# Patient Record
Sex: Male | Born: 1935 | Race: White | Hispanic: No | Marital: Married | Smoking: Former smoker
Health system: Southern US, Community
[De-identification: ages and names within clinical notes are randomized; demographics above are authoritative.]

## PROBLEM LIST (undated history)

## (undated) DIAGNOSIS — K449 Diaphragmatic hernia without obstruction or gangrene: Secondary | ICD-10-CM

## (undated) DIAGNOSIS — Z800 Family history of malignant neoplasm of digestive organs: Secondary | ICD-10-CM

## (undated) DIAGNOSIS — G5139 Clonic hemifacial spasm, unspecified: Secondary | ICD-10-CM

## (undated) DIAGNOSIS — E7800 Pure hypercholesterolemia, unspecified: Secondary | ICD-10-CM

## (undated) DIAGNOSIS — K648 Other hemorrhoids: Secondary | ICD-10-CM

## (undated) DIAGNOSIS — D126 Benign neoplasm of colon, unspecified: Secondary | ICD-10-CM

## (undated) DIAGNOSIS — R451 Restlessness and agitation: Secondary | ICD-10-CM

## (undated) DIAGNOSIS — I10 Essential (primary) hypertension: Secondary | ICD-10-CM

## (undated) HISTORY — DX: Diaphragmatic hernia without obstruction or gangrene: K44.9

## (undated) HISTORY — DX: Clonic hemifacial spasm, unspecified: G51.39

## (undated) HISTORY — DX: Pure hypercholesterolemia, unspecified: E78.00

## (undated) HISTORY — DX: Family history of malignant neoplasm of digestive organs: Z80.0

## (undated) HISTORY — DX: Essential (primary) hypertension: I10

## (undated) HISTORY — PX: COLONOSCOPY: SHX174

## (undated) HISTORY — PX: APPENDECTOMY: SHX54

## (undated) HISTORY — DX: Benign neoplasm of colon, unspecified: D12.6

## (undated) HISTORY — DX: Restlessness and agitation: R45.1

## (undated) HISTORY — DX: Other hemorrhoids: K64.8

## (undated) HISTORY — PX: VASECTOMY: SHX75

## (undated) HISTORY — PX: CATARACT EXTRACTION, BILATERAL: SHX1313

## (undated) HISTORY — PX: CARDIOVASCULAR STRESS TEST: SHX262

---

## 2000-09-26 ENCOUNTER — Ambulatory Visit (HOSPITAL_COMMUNITY): Admission: RE | Admit: 2000-09-26 | Discharge: 2000-09-26 | Payer: Self-pay | Admitting: Gastroenterology

## 2007-02-07 ENCOUNTER — Emergency Department (HOSPITAL_COMMUNITY): Admission: EM | Admit: 2007-02-07 | Discharge: 2007-02-07 | Payer: Self-pay | Admitting: Family Medicine

## 2007-05-01 ENCOUNTER — Ambulatory Visit (HOSPITAL_COMMUNITY): Admission: RE | Admit: 2007-05-01 | Discharge: 2007-05-01 | Payer: Self-pay | Admitting: Endocrinology

## 2007-08-07 ENCOUNTER — Ambulatory Visit: Payer: Self-pay | Admitting: Vascular Surgery

## 2010-10-21 ENCOUNTER — Institutional Professional Consult (permissible substitution) (INDEPENDENT_AMBULATORY_CARE_PROVIDER_SITE_OTHER): Payer: Medicare Other | Admitting: Family Medicine

## 2010-10-21 DIAGNOSIS — I10 Essential (primary) hypertension: Secondary | ICD-10-CM

## 2010-10-21 DIAGNOSIS — E7800 Pure hypercholesterolemia, unspecified: Secondary | ICD-10-CM

## 2010-10-21 DIAGNOSIS — E119 Type 2 diabetes mellitus without complications: Secondary | ICD-10-CM

## 2010-12-22 NOTE — Procedures (Signed)
CAROTID DUPLEX EXAM   INDICATION:  Carotid artery disease.   HISTORY:  Diabetes:  Yes.  Cardiac:  No.  Hypertension:  Yes.  Smoking:  No.  Previous Surgery:  No.  CV History:  Amaurosis Fugax No, Paresthesias No, Hemiparesis No                                       RIGHT             LEFT  Brachial systolic pressure:         148               160  Brachial Doppler waveforms:         WNL               WNL  Vertebral direction of flow:        Antegrade         Antegrade  DUPLEX VELOCITIES (cm/sec)  CCA peak systolic                   127               134  ECA peak systolic                   271               152  ICA peak systolic                   128               120  ICA end diastolic                   34                20  PLAQUE MORPHOLOGY:                  Mixed, calcific   Mixed, calcific  PLAQUE AMOUNT:                      Moderate          Moderate  PLAQUE LOCATION:                    Bifurcation, ICA, ECA               Bifurcation, ICA   IMPRESSION:  1. Bilateral 40-59% internal carotid artery stenosis.  2. Right external carotid artery stenosis.  3. Bilateral vertebral arteries with antegrade flow.   ___________________________________________  Larina Earthly, M.D.   PB/MEDQ  D:  08/07/2007  T:  08/07/2007  Job:  [id number]   cc:   Elmore Guise., M.D.

## 2010-12-25 NOTE — Procedures (Signed)
[facility]  Patient:    Peter [name], Peter [name]                 MRN: [id number] Proc. Date: 09/26/00 Adm. Date:  [id number] Attending:  Orland Mustard CC:         Alfonse Alpers. Dagoberto Ligas, M.D.   Procedure Report  PROCEDURE:  Colonoscopy and coagulation of polyp.  ENDOSCOPIST:  Llana Aliment. Edwards, M.D.  MEDICATIONS:  Fentanyl 75 mcg, Versed 8 mg IV.  SCOPE:  Olympus adult video colonoscope.  INDICATIONS:  Strong family history of colon cancer.  His brother recently had colon cancer.  DESCRIPTION OF PROCEDURE:  The procedure had been explained to the patient and consent obtained.  With the patient in the left lateral decubitus position, the Olympus adult video colonoscope was inserted and advanced under direct visualization.  The prep was excellent.  We were able to advance to the cecum without difficult.  The right lower quadrant was transilluminated and ileocecal valve seen.  The scope was withdrawn.   The cecum, ascending colon, hepatic flexure, transverse colon, splenic flexure, descending and sigmoid colon were seen well.  The colon was free of any polyps, significant diverticula, or any other lesions.  The scope was withdrawn to the rectum, and the rectum was also free of polyps. The patient tolerated the procedure well. The patient as maintained on low-flow oxygen and pulse oximetry throughout the procedure with no evidence of problem.  ASSESSMENT:  No evidence of colon polyps.  PLAN:  Due to his family history, will recommend repeating in five years with yearly hemoccults in between. DD:  09/26/00 TD:  09/27/00 Job: [id number]

## 2010-12-25 NOTE — H&P (Signed)
NAMEMAKAR, SLATTER NO.:  [id number]   MEDICAL RECORD NO.:  [id number]          PATIENT TYPE:  OUT   LOCATION:  XRAY                         FACILITY:  [facility]   PHYSICIAN:  Elmore Guise., M.D.DATE OF BIRTH:  04-27-1930   DATE OF ADMISSION:  07/04/2007  DATE OF DISCHARGE:                              HISTORY & PHYSICAL   INDICATIONS FOR PROCEDURE:  Multiple cardiac risk factors with  increasing exertional chest pain.   PRIMARY CARE PHYSICIAN:  Corrin Parker, M.D.   HISTORY OF PRESENT ILLNESS:  Mr. Chadderdon is a very pleasant 77 year old  white male.  Past medical history of hypertension, dyslipidemia,  diabetes mellitus, who presents for new patient evaluation.  The patient  initially presented for stress testing back in February of this year.  At that time, he underwent a stress Cardiolite exercising via Bruce  protocol for 3 minutes and 30 seconds achieving a peak heart rate of 134  beats per minute.  He had no evidence of inducible ischemia and normal  LV systolic function with an EF of 64%.  He states he continued to do  his normal activities.  However now with walking up a hill, pushing a  trash can or lawn mower, walking more than 6 blocks, he will note  increasing exertional chest pressure and tightness.  He states that his  symptoms have been present for the last 5 years.  However, seemed to be  a little bit more intense currently.  They are associated with shortness  of breath.  He will stop and it will go away.  He can then resume his  activities at a decreased intensity and seems to do pretty well.  He has  no palpitations.  No diaphoresis and no nausea or vomiting associated  with it.  He initially thought this was from esophageal spasm and has  had an upper GI which did show occasional spasm per patient.  He  currently is not having any restless symptoms.  His weight is stable for  him.  He denies any orthopnea or PND.  He does have chronic  lower  extremity edema, right greater than left.  His blood pressure has been  elevated for some time.  He does report when having a vasectomy over 30  years ago, he thinks he had a reaction to the Betadine solution which  caused him to get short of breath.  He denies any other problems.  However since that time, he has been told that he may have an iodine  allergy.  Per his recall, he denies any recent contrast exposure.   REVIEW OF SYSTEMS:  As per HPI, otherwise negative.   CURRENT MEDICATIONS:  1. Caduet 10/10 mg daily.  2. Aspirin 81 mg daily.  3. Actos 45 mg daily.  4. Cialis or Viagra p.r.n.  5. Benicar 40 mg daily.  6. Januvia 100 mg daily.  7. Tekturna 300 mg daily.   ALLERGIES:  1. BETADINE.  2. (Questionable IODINE with shortness of breath and swelling.)   FAMILY HISTORY:  Positive for hypertension with his brothers.  No  history  of early heart disease.   SOCIAL HISTORY:  He is married.  He is retired from Corporate treasurer  at [employer].  He does not exercise, but  remains very active with yard work  and around the house.  Smoked for 15 pack years, quit 30 years ago.  Drinks 1 alcoholic beverage daily and 2-3 cups of coffee daily.   PAST SURGICAL HISTORY:  1. Includes hernia repair.  2. Vasectomy in the past.   PHYSICAL EXAMINATION:  VITAL SIGNS:  Weight is 211 pounds, blood  pressure is 172/80, heart rate 108 and regular.  GENERAL:  He is a very pleasant, elderly white male alert and oriented x  4.  No acute distress.  HEENT:  Normal.  NECK:  Supple.  No lymphadenopathy, 2+ carotids.  No JVD.  He does have  soft bruit,  left greater than right.  LUNGS:  Clear.  HEART:  Regular with normal S1-S2, soft 2/6 systolic ejection murmur  noted.  ABDOMEN:  Soft, nontender, nondistended.  No rebound or guarding.  EXTREMITIES:  Warm with 2+ pedal pulses, 2+ femoral pulses.  He has 2+  edema on the right, 1+ edema on the left.   LABORATORY DATA:  His most recent blood  work was reviewed and showed a  BUN 25, creatinine 1.1, potassium level 4.6, white blood cell count 5.3,  hemoglobin of 12.3 and platelet count 235.  His coags showed PT and an  INR of 12.9 and 1.0 with a PTT of 26.   DIAGNOSTICS:  His last EKG showed normal sinus rhythm with a rate of 80  per minute, normal axis, normal intervals with no significant ST/T-wave  changes.   IMPRESSION:  1. Exertional angina  2. Dyslipidemia.  3. Diabetes mellitus.  4. Hypertension   PLAN:  1. At this time, he will continue his medicines as listed.  2. He is not having any unstable symptoms.  However, because of      symptoms, he will be scheduled for heart catheterization.  3. At this time, I would recommend adding Coreg CR 20 mg once daily to      help with better blood pressure control as well as decreasing      myocardial work load.  4. Since he has history of allergies to Betadine and questionable      allergy to iodine, we will prophylax with Benadryl and prednisone a      day prior and day of procedure.  He will also get an H2 blocker day      of procedure.  5. Risks of the procedure were explained to him in detail and the      patient would like to proceed.  All his questions were answered.     Elmore Guise., M.D.  Electronically Signed    TWK/MEDQ  D:  07/04/2007  T:  07/04/2007  Job:  [id number]   cc:   Alfonse Alpers. Dagoberto Ligas, M.D.

## 2011-01-14 ENCOUNTER — Encounter: Payer: Self-pay | Admitting: *Deleted

## 2011-01-18 ENCOUNTER — Other Ambulatory Visit: Payer: Medicare Other

## 2011-01-18 DIAGNOSIS — E7800 Pure hypercholesterolemia, unspecified: Secondary | ICD-10-CM

## 2011-01-18 DIAGNOSIS — E119 Type 2 diabetes mellitus without complications: Secondary | ICD-10-CM

## 2011-01-18 DIAGNOSIS — I10 Essential (primary) hypertension: Secondary | ICD-10-CM

## 2011-01-18 LAB — COMPREHENSIVE METABOLIC PANEL
Albumin: 4.5 g/dL (ref 3.5–5.2)
Alkaline Phosphatase: 55 U/L (ref 39–117)
BUN: 23 mg/dL (ref 6–23)
CO2: 25 mEq/L (ref 19–32)
Calcium: 8.8 mg/dL (ref 8.4–10.5)
Glucose, Bld: 130 mg/dL — ABNORMAL HIGH (ref 70–99)
Potassium: 4.9 mEq/L (ref 3.5–5.3)
Total Protein: 6.7 g/dL (ref 6.0–8.3)

## 2011-01-18 LAB — LIPID PANEL
Cholesterol: 132 mg/dL (ref 0–200)
HDL: 55 mg/dL (ref >39)
LDL Cholesterol: 65 mg/dL (ref 0–99)
Triglycerides: 59 mg/dL (ref <150)

## 2011-01-18 LAB — TSH: TSH: 3.303 u[IU]/mL (ref 0.350–4.500)

## 2011-01-19 LAB — MICROALBUMIN / CREATININE URINE RATIO
Creatinine, Urine: 134.6 mg/dL
Microalb Creat Ratio: 5.2 mg/g (ref 0.0–30.0)
Microalb, Ur: 0.7 mg/dL (ref 0.00–1.89)

## 2011-01-21 ENCOUNTER — Telehealth: Payer: Self-pay | Admitting: *Deleted

## 2011-01-21 ENCOUNTER — Encounter: Payer: Self-pay | Admitting: Family Medicine

## 2011-01-21 ENCOUNTER — Ambulatory Visit (INDEPENDENT_AMBULATORY_CARE_PROVIDER_SITE_OTHER): Payer: Medicare Other | Admitting: Family Medicine

## 2011-01-21 VITALS — BP 150/74 | HR 68 | Ht 69.5 in | Wt 193.0 lb

## 2011-01-21 DIAGNOSIS — I10 Essential (primary) hypertension: Secondary | ICD-10-CM

## 2011-01-21 DIAGNOSIS — R079 Chest pain, unspecified: Secondary | ICD-10-CM

## 2011-01-21 DIAGNOSIS — E7800 Pure hypercholesterolemia, unspecified: Secondary | ICD-10-CM

## 2011-01-21 DIAGNOSIS — E119 Type 2 diabetes mellitus without complications: Secondary | ICD-10-CM

## 2011-01-21 DIAGNOSIS — E118 Type 2 diabetes mellitus with unspecified complications: Secondary | ICD-10-CM | POA: Insufficient documentation

## 2011-01-21 DIAGNOSIS — N529 Male erectile dysfunction, unspecified: Secondary | ICD-10-CM | POA: Insufficient documentation

## 2011-01-21 MED ORDER — SILDENAFIL CITRATE 50 MG PO TABS: 50.0000 mg | ORAL_TABLET | ORAL | Status: DC | PRN

## 2011-01-21 MED ORDER — CARVEDILOL PHOSPHATE ER 40 MG PO CP24: 40.0000 mg | ORAL_CAPSULE | Freq: Every day | ORAL | Status: DC

## 2011-01-21 MED ORDER — CARVEDILOL 25 MG PO TABS: 25.0000 mg | ORAL_TABLET | Freq: Two times a day (BID) | ORAL | Status: DC

## 2011-01-21 NOTE — Patient Instructions (Signed)
Try and exercise daily (take zantac or pepcid prior to walking). We discussed cutting back on alcohol to help with the blood sugars

## 2011-01-21 NOTE — Progress Notes (Signed)
Subjective:    Patient ID: Peter [name], male    DOB: 12-29-1929, 77 y.o.   MRN: [id number]  HPI Patient presents for follow up on diabetes.  Doesn't check his blood sugars.  Drinks more alcohol in the summertime (2 gin and tonics 3-4 days/week) and has gained some weight.  Diet has otherwise been healthy. Remains active, but no formal exercise.  Doesn't walk much, because it seems to aggravate his indigestion, and is immediately relieved by 2 Tums (had negative stress test in past).    Patient complains of erectile dysfunction. Previously tried Viagra with good results  Hypertension follow-up:  Blood pressures elsewhere are 120-159/60's-70, averaging 135-140, pulse averaging 70.  Denies dizziness, headaches, chest pain.  Denies side effects of medications.  Past Medical History  Diagnosis Date  . Hypertension age 60  . Diabetes mellitus diet controlled  . Hiatal hernia   . Hypercholesteremia   . FHx: colon cancer   . Asthma h/o as a child-s/p immunotherapy    Past Surgical History  Procedure Date  . Cardiovascular stress test normal  . Appendectomy   . Vasectomy   . Colonoscopy 3/08    History   Social History  . Marital Status: Married    Spouse Name: N/A    Number of Children: N/A  . Years of Education: N/A   Occupational History  . Not on file.   Social History Main Topics  . Smoking status: Former Smoker    Quit date: 08/09/1940  . Smokeless tobacco: Not on file  . Alcohol Use: Yes     1-2 drinks daily  . Drug Use: No  . Sexually Active: Not on file   Other Topics Concern  . Not on file   Social History Narrative  . No narrative on file    Family History  Problem Relation Age of Onset  . Alcohol abuse Father   . Cancer Sister     stomach/?pancreatic  . Colon cancer Brother 65  . Cancer Brother     colon cancer in mid 8's  . Arthritis Daughter     psoratic    Current outpatient prescriptions:amLODipine (NORVASC) 10 MG tablet, Take 10 mg  by mouth daily.  , Disp: , Rfl: ;  aspirin 81 MG tablet, Take 162 mg by mouth daily.  , Disp: , Rfl: ;  atorvastatin (LIPITOR) 40 MG tablet, Take 40 mg by mouth daily.  , Disp: , Rfl: ;  carvedilol (COREG CR) 40 MG 24 hr capsule, Take 1 capsule (40 mg total) by mouth daily., Disp: 30 capsule, Rfl: 5;  Coenzyme Q10 10 MG capsule, Take 10 mg by mouth daily.  , Disp: , Rfl:  Multiple Vitamins-Minerals (MULTIVITAMIN WITH MINERALS) tablet, Take 1 tablet by mouth daily.  , Disp: , Rfl: ;  Omega-3 Fatty Acids (FISH OIL) 1000 MG CAPS, Take 1 capsule by mouth daily.  , Disp: , Rfl: ;  DISCONTD: amLODipine-atorvastatin (CADUET) 10-40 MG per tablet, Take 1 tablet by mouth daily.  , Disp: , Rfl: ;  DISCONTD: carvedilol (COREG CR) 20 MG 24 hr capsule, Take 20 mg by mouth daily. , Disp: , Rfl:  cetirizine (ZYRTEC) 10 MG tablet, Take 10 mg by mouth daily.  , Disp: , Rfl: ;  sildenafil (VIAGRA) 50 MG tablet, Take 1 tablet (50 mg total) by mouth as needed for erectile dysfunction., Disp: 4 tablet, Rfl: 0  Allergies  Allergen Reactions  . Iodine Other (See Comments)    Feels like he  is "burning up"  . Penicillins     Review of Systems Indigestion/chest pressure related to exercise only.  Denies palpitations, URI symptoms, cough, SOB, edema, nausea/vomiting.  + ED. No polydipsia or polyuria. No skin concerns.  No other complaints/concerns    Objective:   Physical Exam  Well developed, well nourished patient, in no distress BP 150/74  Pulse 68  Ht 5' 9.5" (1.765 m)  Wt 193 lb (87.544 kg)  BMI 28.09 kg/m2 Neck: No lymphadenopathy or thyromegaly, no carotid bruit Heart:  Regular rate and rhythm, no murmurs, rubs, gallops or ectopy Lungs:  Clear bilaterally, without wheezes, rales or ronchi Abdomen:  Soft, nontender, nondistended, no hepatosplenomegaly or masses, normal bowel sounds Extremities:  No clubbing, cyanosis or edema, 2+ pulses.  Neuro:  Alert and oriented x 3, cranial nerves grossly intact.  DTR's 2+  and symmetric.  Normal strength and sensation Back:  No spine or CVA tenderness Skin: no rashes or suspicious lesions Psych:  Normal mood, affect, hygiene and grooming, normal speech, eye contact         Assessment & Plan:   1. Type II or unspecified type diabetes mellitus without mention of complication, not stated as uncontrolled     Diet controlled.  A1c higher than previously, will work on diet  2. Pure hypercholesterolemia     Well controlled  3. Essential hypertension, benign  carvedilol (COREG CR) 40 MG 24 hr capsule   Suboptimally controlled.  Goal <130/80  4. Chest pain on exertion  Ambulatory referral to Cardiology   negative stress test in past, but ongoing exertional symptoms.  Refer for re-evaluation  5. Erectile dysfunction     Increase coreg to 40mg  qd.  Check BP's at home and fax/mail in 1 month  F/u 3 months--A1c at visit Refer to cardiology--consider repeat stress test

## 2011-01-21 NOTE — Telephone Encounter (Signed)
Spoke with [facility], pt has been taking Carvedilol(Coreg) plain 25mg  BID, his insurance will not pay for the Coreg CR 40 mg once daily. Called patient and explained to him and stressed to him the importance of taking this medication as prescribed TWICE daily. Also called the rx into [facility].

## 2011-01-27 ENCOUNTER — Encounter: Payer: Self-pay | Admitting: Cardiovascular Disease

## 2011-02-11 ENCOUNTER — Encounter: Payer: Self-pay | Admitting: Cardiovascular Disease

## 2011-02-11 ENCOUNTER — Ambulatory Visit (INDEPENDENT_AMBULATORY_CARE_PROVIDER_SITE_OTHER): Payer: Medicare Other | Admitting: Cardiovascular Disease

## 2011-02-11 VITALS — BP 130/64 | HR 70 | Wt 193.0 lb

## 2011-02-11 DIAGNOSIS — E7800 Pure hypercholesterolemia, unspecified: Secondary | ICD-10-CM

## 2011-02-11 DIAGNOSIS — I10 Essential (primary) hypertension: Secondary | ICD-10-CM

## 2011-02-11 NOTE — Assessment & Plan Note (Signed)
His cholesterol levels are well controlled. We will continue with his current dose of Lipitor.

## 2011-02-11 NOTE — Assessment & Plan Note (Signed)
His blood pressure is well controlled. We'll continue with his same medications.  I've asked him to watch her salt intake. He admits that he still uses quite a bit of salt. I have also asked him to exercise on regular basis

## 2011-02-11 NOTE — Progress Notes (Signed)
Gardiner Barefoot Date of Birth  [birth year] [facility] [address]     [address] [place], [place]  [address] [phone]  Fax  [phone]  History of Present Illness:  77 yo patient of Dr. Reyes Ivan.  Hx of diabetes, HTN, dyslipidemia, and elevated coronary calcium score.  Also has mild-moderate carotid disease.  Is active but he doesn't walk regularly.  Has a cabin in [place].  No chest pain or dyspnea with his usual activities.    Current Outpatient Prescriptions on File Prior to Visit  Medication Sig Dispense Refill  . amLODipine (NORVASC) 10 MG tablet Take 10 mg by mouth daily.        Marland Kitchen aspirin 81 MG tablet Take 162 mg by mouth daily.        Marland Kitchen atorvastatin (LIPITOR) 40 MG tablet Take 40 mg by mouth daily.        . cetirizine (ZYRTEC) 10 MG tablet Take 10 mg by mouth daily. Takes as needed      . Coenzyme Q10 10 MG capsule Take 10 mg by mouth daily.        . Multiple Vitamins-Minerals (MULTIVITAMIN WITH MINERALS) tablet Take 1 tablet by mouth daily.        . Omega-3 Fatty Acids (FISH OIL) 1000 MG CAPS Take 1 capsule by mouth daily.        . sildenafil (VIAGRA) 50 MG tablet Take 1 tablet (50 mg total) by mouth as needed for erectile dysfunction.  4 tablet  0  . DISCONTD: carvedilol (COREG) 25 MG tablet Take 1 tablet (25 mg total) by mouth 2 (two) times daily with a meal.  60 tablet  5    Allergies  Allergen Reactions  . Iodine Other (See Comments)    Feels like he is "burning up"  . Penicillins     Past Medical History  Diagnosis Date  . Hypertension age 35  . Diabetes mellitus diet controlled  . Hiatal hernia   . Hypercholesteremia   . FHx: colon cancer   . Asthma h/o as a child-s/p immunotherapy    Past Surgical History  Procedure Date  . Cardiovascular stress test normal  . Appendectomy   . Vasectomy   . Colonoscopy 3/08    History  Smoking status  . Former Smoker  . Quit date: 08/09/1940  Smokeless  tobacco  . Not on file    History  Alcohol Use  . Yes    1-2 drinks daily    Family History  Problem Relation Age of Onset  . Alcohol abuse Father   . Cancer Sister     stomach/?pancreatic  . Colon cancer Brother 72  . Cancer Brother     colon cancer in mid 74's  . Arthritis Daughter     psoratic    Reviw of Systems:  Reviewed in the HPI.  All other systems are negative.  Physical Exam: BP 130/64  Pulse 70  Wt 193 lb (87.544 kg) The patient is alert and oriented x 3.  The mood and affect are normal.   Skin: warm and dry.  Color is normal.    HEENT:   the sclera are nonicteric.  The mucous membranes are moist.  The carotids are 2+ without bruits.  There is no thyromegaly.  There is no JVD.    Lungs: clear.  The chest wall is non tender.    Heart: regular rate with a normal S1 and S2.  There are no murmurs, gallops, or rubs. The PMI is not displaced.     Abdomin: good bowel sounds.  There is no guarding or rebound.  There is no hepatosplenomegaly or tenderness.  There are no masses.   Extremities:  no clubbing, cyanosis, or edema.  The legs are without rashes.  The distal pulses are intact.   Neuro:  Cranial nerves II - XII are intact.  Motor and sensory functions are intact.    The gait is normal.  Assessment / Plan:

## 2011-03-26 ENCOUNTER — Other Ambulatory Visit: Payer: Self-pay | Admitting: Medical

## 2011-03-26 MED ORDER — AMLODIPINE BESYLATE 10 MG PO TABS: 10.0000 mg | ORAL_TABLET | Freq: Every day | ORAL | Status: DC

## 2011-03-26 MED ORDER — ATORVASTATIN CALCIUM 40 MG PO TABS: 40.0000 mg | ORAL_TABLET | Freq: Every day | ORAL | Status: DC

## 2011-04-26 ENCOUNTER — Ambulatory Visit: Payer: Medicare Other | Admitting: Family Medicine

## 2011-05-06 ENCOUNTER — Ambulatory Visit (INDEPENDENT_AMBULATORY_CARE_PROVIDER_SITE_OTHER): Payer: Medicare Other | Admitting: Family Medicine

## 2011-05-06 ENCOUNTER — Encounter: Payer: Self-pay | Admitting: Family Medicine

## 2011-05-06 VITALS — BP 132/64 | HR 68 | Ht 69.5 in | Wt 197.0 lb

## 2011-05-06 DIAGNOSIS — Z23 Encounter for immunization: Secondary | ICD-10-CM

## 2011-05-06 DIAGNOSIS — E119 Type 2 diabetes mellitus without complications: Secondary | ICD-10-CM

## 2011-05-06 LAB — POCT GLYCOSYLATED HEMOGLOBIN (HGB A1C): Hemoglobin A1C: 6.1

## 2011-05-06 NOTE — Patient Instructions (Addendum)
Try and get at least 30 minutes of aerobic activity most days of the week.  It can be 15 minutes at a time, doesn't need to be all 30 minutes at once. Weight loss is encouraged. Try and remember to take evening medications  2 Gram Low Sodium Diet A 2 gram sodium diet restricts the amount of sodium in the diet to no more than 2 grams (g) or 2000 milligrams (mg) daily. Limiting the amount of sodium is often used to help lower blood pressure. It is important if you have heart, liver, or kidney problems. Many foods contain sodium for flavor and sometimes as a preservative. When the amount of sodium in a diet needs to be low, it is important to know what to look for when choosing foods and drinks. The following includes some information and guidelines to help make it easier for you to adapt to a low sodium diet. QUICK TIPS  Do not add salt to food.   Avoid convenience items and fast food.   Choose unsalted snack foods.   Buy lower sodium products, often labeled as "lower sodium" or "no salt added."   Check food labels to learn how much sodium is in 1 serving.   When eating at a restaurant, ask that your food be prepared with less salt or none, if possible.  READING FOOD LABELS FOR SODIUM INFORMATION The nutrition facts label is a good place to find how much sodium is in foods. Look for products with no more than 500 to 600 mg of sodium per meal and no more than 150 mg per serving. Remember that 2 g = 2000 mg. The food label may also list foods as:  Sodium-free: Less than 5 mg in a serving.   Very low sodium: 35 mg or less in a serving.   Low-sodium: 140 mg or less in a serving.   Light in sodium: 50% less sodium in a serving. For example, if a food that usually has 300 mg of sodium is changed to become light in sodium, it will have 150 mg of sodium.   Reduced sodium: 25% less sodium in a serving. For example, if a food that usually has 400 mg of sodium is changed to reduced sodium, it will  have 300 mg of sodium.  CHOOSING FOODS AVOID CHOOSE  Grains: Salted crackers and snack items. Some cereals, including instant hot cereals. Bread stuffing and biscuit mixes. Seasoned rice or pasta mixes. Grains: Unsalted snack items. Low-sodium cereals, oats, puffed wheat and rice, shredded wheat. English muffins and bread. Pasta.  Meats: Salted, canned, smoked, spiced, or pickled meats, including fish and poultry. Bacon, ham, sausage, cold cuts, hot dogs, anchovies. Meats: Low-sodium canned tuna and salmon. Fresh or frozen meat, poultry, and fish.  Dairy: Processed cheese and spreads. Cottage cheese. Buttermilk and condensed milk. Regular cheese. Dairy: Milk. Low-sodium cottage cheese. Yogurt. Sour cream. Low-sodium cheese.  Fruits and Vegetables: Regular canned vegetables. Regular canned tomato sauce and paste. Frozen vegetables in sauces. Olives. Rosita Fire. Relishes. Sauerkraut. Fruits and Vegetables: Low-sodium canned vegetables. Low-sodium tomato sauce and paste. Fresh and frozen vegetables. Fresh and frozen fruit.  Condiments: Canned and packaged gravies. Worcestershire sauce. Tartar sauce. Barbecue sauce. Soy sauce. Steak sauce. Ketchup. Onion, garlic, and table salt. Meat flavorings and tenderizers. Condiments: Fresh and dried herbs and spices. Low-sodium varieties of mustard and ketchup. Lemon juice. Tabasco sauce. Horseradish.  SAMPLE 2 GRAM SODIUM MEAL PLAN Meal Foods Sodium (mg)  Breakfast 1 cup low-fat milk 2 slices  whole-wheat toast 1 tablespoon heart-healthy margarine 1 hard-boiled egg 1 small orange [phone]  Lunch 1 cup raw carrots  cup hummus 1 cup low-fat milk  cup red grapes 1 whole-wheat pita bread [phone]  Dinner 1 cup whole-wheat pasta 1 cup low-sodium tomato sauce 3 ounces lean ground beef 1 small side salad (1 cup raw spinach leaves,  cup cucumber,  cup yellow bell pepper) with 1 teaspoon olive  oil and 1 teaspoon red wine vinegar 2 73 [phone] 25  Snack 1 container low-fat vanilla yogurt 3 graham cracker squares 107 127  Nutrient Analysis Calories: 2033 Protein: 77 g Carbohydrate: 282 g Fat: 72 g Sodium: 1971 mg Document Released: 07/26/2005 Document Re-Released: 01/13/2010 [facility] Patient Information 2011 [place], [place].

## 2011-05-06 NOTE — Progress Notes (Signed)
Patient presents for follow up on diabetes. Doesn't check his blood sugars. Drinks more alcohol in the summertime (2 gin and tonics 3-4 days/week) and has gained some weight.  Has cut back on the alcohol some, but still admits to having 2 drinks 3x/week. He doesn't really watch his diet--eats some candy, carbs. He previously took Actos for 9 months, while under the care of Dr. Dagoberto Ligas.  Didn't seem to make a difference, and has done well off of medications. Remains active (building a shed), but no formal exercise. Doesn't walk much, because it seems to aggravate his indigestion, tight feeling in his chest.  He has seen Dr. Elease Hashimoto since the last visit.  Tries to occasionally walk, and the chest discomfort (ongoing x 10 years, diagnosed as hiatal hernia in the past) doesn't seem as bad as in the past.  Hypertension follow-up: Blood pressures elsewhere are 140-155/low 70's, with a lot of fluctuations in the numbers.  Coreg was changed from CR 25mg , to a generic, now no longer CR, and is taking 25 mg BID.  Admits to missing the evening dose frequently. Denies dizziness, headaches, chest pain. Denies side effects of medications.  Past Medical History  Diagnosis Date  . Hypertension age 77  . Diabetes mellitus diet controlled  . Hiatal hernia   . Hypercholesteremia   . FHx: colon cancer   . Asthma h/o as a child-s/p immunotherapy    Past Surgical History  Procedure Date  . Cardiovascular stress test normal  . Appendectomy   . Vasectomy   . Colonoscopy 3/08    History   Social History  . Marital Status: Married    Spouse Name: N/A    Number of Children: N/A  . Years of Education: N/A   Occupational History  . Not on file.   Social History Main Topics  . Smoking status: Former Smoker    Quit date: 08/09/1940  . Smokeless tobacco: Not on file  . Alcohol Use: Yes     1-2 drinks daily  . Drug Use: No  . Sexually Active: Not on file   Other Topics Concern  . Not on file   Social  History Narrative  . No narrative on file    Family History  Problem Relation Age of Onset  . Alcohol abuse Father   . Cancer Sister     stomach/?pancreatic  . Colon cancer Brother 38  . Cancer Brother     colon cancer in mid 39's  . Arthritis Daughter     psoratic    Current outpatient prescriptions:amLODipine (NORVASC) 10 MG tablet, Take 1 tablet (10 mg total) by mouth daily., Disp: 30 tablet, Rfl: 5;  aspirin 81 MG tablet, Take 162 mg by mouth daily.  , Disp: , Rfl: ;  atorvastatin (LIPITOR) 40 MG tablet, Take 1 tablet (40 mg total) by mouth daily., Disp: 30 tablet, Rfl: 5;  carvedilol (COREG) 25 MG tablet, Take 25 mg by mouth 2 (two) times daily with a meal. Taking 40mg  total , Disp: , Rfl:  Coenzyme Q10 10 MG capsule, Take 10 mg by mouth daily.  , Disp: , Rfl: ;  Omega-3 Fatty Acids (FISH OIL) 1000 MG CAPS, Take 1 capsule by mouth daily.  , Disp: , Rfl: ;  sildenafil (VIAGRA) 50 MG tablet, Take 1 tablet (50 mg total) by mouth as needed for erectile dysfunction., Disp: 4 tablet, Rfl: 0;  cetirizine (ZYRTEC) 10 MG tablet, Take 10 mg by mouth daily. Takes as needed, Disp: , Rfl:  Multiple Vitamins-Minerals (MULTIVITAMIN WITH MINERALS) tablet, Take 1 tablet by mouth daily.  , Disp: , Rfl:   Allergies  Allergen Reactions  . Iodine Other (See Comments)    Feels like he is "burning up"  . Penicillins    ROS:  Denies fevers, URI, SOB, see HPI.  Denies skin lesions, numbness or other concerns  PHYSICAL EXAM: BP 132/64  Pulse 68  Ht 5' 9.5" (1.765 m)  Wt 197 lb (89.359 kg)  BMI 28.67 kg/m2 Neck: No lymphadenopathy or thyromegaly, no carotid bruit  Heart: Regular rate and rhythm, no murmurs, rubs, gallops or ectopy  Lungs: Clear bilaterally, without wheezes, rales or ronchi  Abdomen: Soft, nontender, nondistended, no hepatosplenomegaly or masses, normal bowel sounds  Extremities: No clubbing, cyanosis or edema, 2+ pulses.  Neuro: Alert and oriented x 3, cranial nerves grossly intact.  DTR's 2+ and symmetric. Normal strength and sensation  Back: No spine or CVA tenderness  Skin: no rashes or suspicious lesions  Psych: Normal mood, affect, hygiene and grooming, normal speech, eye contact   ASSESSMENT/PLAN: 1. Type II or unspecified type diabetes mellitus without mention of complication, not stated as uncontrolled  POCT HgB A1C  2. Need for prophylactic vaccination and inoculation against influenza  Flu vaccine greater than or equal to 3yo preservative free IM  3. Need for Tdap vaccination     Shingles vaccine recommended; risks/benefits reviewed.  Written Rx given TdaP today (no record in old chart) Flu shot today  Encouraged healthy diet, regular exercise, weight loss

## 2011-06-25 ENCOUNTER — Ambulatory Visit (INDEPENDENT_AMBULATORY_CARE_PROVIDER_SITE_OTHER): Payer: Medicare Other | Admitting: Medical

## 2011-06-25 ENCOUNTER — Encounter: Payer: Self-pay | Admitting: Medical

## 2011-06-25 VITALS — BP 110/80 | HR 60 | Temp 98.2°F | Resp 16 | Wt 193.0 lb

## 2011-06-25 DIAGNOSIS — R05 Cough: Secondary | ICD-10-CM

## 2011-06-25 DIAGNOSIS — R059 Cough, unspecified: Secondary | ICD-10-CM

## 2011-06-25 DIAGNOSIS — J329 Chronic sinusitis, unspecified: Secondary | ICD-10-CM

## 2011-06-25 MED ORDER — AZITHROMYCIN 250 MG PO TABS: ORAL_TABLET | ORAL | Status: AC

## 2011-06-25 NOTE — Patient Instructions (Signed)

## 2011-06-25 NOTE — Progress Notes (Signed)
Subjective:   HPI  Peter [name] is a 77 y.o. male who presents for five-day history of sinus pressure, cough, headache, postnasal drainage, sore throat, and says he gets a sinus infection this time every year.  He always notes he gets it about the time he cuts his furnace on in the fall/winter. In the past he has always done well with Z-Pak. Denies sick contacts. No fever.  No other aggravating or relieving factors.    No other c/o.  The following portions of the patient's history were reviewed and updated as appropriate: allergies, current medications, past family history, past medical history, past social history, past surgical history and problem list.  Past Medical History  Diagnosis Date  . Hypertension age 60  . Diabetes mellitus diet controlled  . Hiatal hernia   . Hypercholesteremia   . FHx: colon cancer   . Asthma h/o as a child-s/p immunotherapy    Review of Systems Constitutional: -fever, -chills, -sweats, -unexpected -weight change,-fatigue ENT: -runny nose, -ear pain, -sore throat Cardiology:  -chest pain, -palpitations, -edema Respiratory: +cough, -shortness of breath, -wheezing Gastroenterology: -abdominal pain, -nausea, -vomiting, -diarrhea, -constipation Hematology: -bleeding or bruising problems Musculoskeletal: -arthralgias, -myalgias, -joint swelling, -back pain Ophthalmology: -vision changes Urology: -dysuria, -difficulty urinating, -hematuria, -urinary frequency, -urgency Neurology: -headache, -weakness, -tingling, -numbness    Objective:   Filed Vitals:   06/25/11 1150  BP: 110/80  Pulse: 60  Temp: 98.2 F (36.8 C)  Resp: 16    General appearance: Alert, WD/WN, no distress                             Skin: warm, no rash                           Head: +frontal sinus tenderness,                            Eyes: conjunctiva normal, corneas clear, PERRLA                            Ears: right TM with serous fluid, left TM normal, external ear  canals normal                          Nose: septum midline, turbinates swollen, with erythema and clear discharge             Mouth/throat: MMM, tongue normal, mild pharyngeal erythema                           Neck: supple, no adenopathy, no thyromegaly, nontender                          Heart: RRR, normal S1, S2, no murmurs                         Lungs: CTA bilaterally, no wheezes, rales, or rhonchi      Assessment and Plan:   Encounter Diagnoses  Name Primary?  . Sinusitis Yes  . Cough     Prescription given for Zpak.  Can use OTC Mucinex for congestion.  Tylenol or Ibuprofen OTC for fever and malaise.  Discussed symptomatic relief, nasal saline, and  call or return if worse or not improving in 2-3 days.

## 2011-07-20 ENCOUNTER — Other Ambulatory Visit: Payer: Self-pay | Admitting: Family Medicine

## 2011-07-20 NOTE — Telephone Encounter (Signed)
PATIENT NEEDS AN OFFICE VISIT. CLS

## 2011-10-07 ENCOUNTER — Telehealth: Payer: Self-pay | Admitting: *Deleted

## 2011-10-07 ENCOUNTER — Other Ambulatory Visit: Payer: Self-pay | Admitting: Family Medicine

## 2011-10-07 NOTE — Telephone Encounter (Signed)
Left message for patient that I refilled his Coreg x 2 months but he needs to call our office and schedule 6 month fasting follow up on HTN and DM around 11/03/11 with Dr.[name].

## 2011-11-02 ENCOUNTER — Telehealth: Payer: Self-pay | Admitting: Internal Medicine

## 2011-11-02 NOTE — Telephone Encounter (Signed)
atorvastain 40mg  #30

## 2011-11-03 ENCOUNTER — Other Ambulatory Visit: Payer: Self-pay | Admitting: *Deleted

## 2011-11-03 DIAGNOSIS — I10 Essential (primary) hypertension: Secondary | ICD-10-CM

## 2011-11-03 DIAGNOSIS — E7800 Pure hypercholesterolemia, unspecified: Secondary | ICD-10-CM

## 2011-11-03 MED ORDER — AMLODIPINE BESYLATE 10 MG PO TABS: 10.0000 mg | ORAL_TABLET | Freq: Every day | ORAL | Status: DC

## 2011-11-03 MED ORDER — ATORVASTATIN CALCIUM 40 MG PO TABS: 40.0000 mg | ORAL_TABLET | Freq: Every day | ORAL | Status: DC

## 2011-11-03 NOTE — Telephone Encounter (Signed)
Done

## 2011-11-15 ENCOUNTER — Ambulatory Visit (INDEPENDENT_AMBULATORY_CARE_PROVIDER_SITE_OTHER): Payer: Medicare Other | Admitting: Family Medicine

## 2011-11-15 ENCOUNTER — Encounter: Payer: Self-pay | Admitting: Family Medicine

## 2011-11-15 VITALS — BP 150/60 | HR 64 | Ht 68.0 in | Wt 193.0 lb

## 2011-11-15 DIAGNOSIS — E119 Type 2 diabetes mellitus without complications: Secondary | ICD-10-CM

## 2011-11-15 DIAGNOSIS — Z79899 Other long term (current) drug therapy: Secondary | ICD-10-CM

## 2011-11-15 DIAGNOSIS — Z0000 Encounter for general adult medical examination without abnormal findings: Secondary | ICD-10-CM

## 2011-11-15 DIAGNOSIS — Z125 Encounter for screening for malignant neoplasm of prostate: Secondary | ICD-10-CM

## 2011-11-15 DIAGNOSIS — N529 Male erectile dysfunction, unspecified: Secondary | ICD-10-CM

## 2011-11-15 DIAGNOSIS — E7800 Pure hypercholesterolemia, unspecified: Secondary | ICD-10-CM

## 2011-11-15 DIAGNOSIS — I10 Essential (primary) hypertension: Secondary | ICD-10-CM

## 2011-11-15 LAB — POCT URINALYSIS DIPSTICK
Blood, UA: NEGATIVE
Color, UA: 1.02
Leukocytes, UA: NEGATIVE
Nitrite, UA: NEGATIVE
Protein, UA: NEGATIVE
pH, UA: 5

## 2011-11-15 NOTE — Progress Notes (Signed)
Peter [name] is a 78 y.o. male who presents for a complete physical.  He has the following concerns:  F/u HTN--admits to missing the second dose of Coreg about 50% of the time.  Previously his BP was well controlled on the Coreg CR, but insurance made him change to the generic, and forgets to take the second dose. Denies headaches, chest pain, shortness of breath.  ED--previously tried Viagra samples (50mg ), which was effective.    Health maintenance: Immunization History  Administered Date(s) Administered  . Influenza Split 05/06/2011  . Pneumococcal Polysaccharide 08/09/2006  . Tdap 05/06/2011  Has Rx for Zostavax, but hasn't gone to pharmacy to get--has questions Last colonoscopy: 3/08, due again now Last PSA: 2 years ago Dentist: twice yearly Ophtho: February; has cataracts Exercise: "not enough".  Active with work around the house, but no regular aerobic exercise.  Past Medical History  Diagnosis Date  . Hypertension age 31  . Diabetes mellitus diet controlled  . Hiatal hernia   . Hypercholesteremia   . FHx: colon cancer   . Asthma h/o as a child-s/p immunotherapy    Past Surgical History  Procedure Date  . Cardiovascular stress test normal  . Appendectomy   . Vasectomy   . Colonoscopy 3/08    History   Social History  . Marital Status: Married    Spouse Name: N/A    Number of Children: 8  . Years of Education: N/A   Occupational History  . 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 Smoking status: Former Smoker    Quit date: 08/09/1974  . Smokeless tobacco: Never Used  . Alcohol Use: Yes     1-2 drinks daily  . Drug Use: No  . Sexually Active: Yes -- Male partner(s)   Other Topics Concern  . Not on file   Social History Narrative   Lives with wife, dog.  Children live in [place], [place], [place], [place], and 2 daughters here in [place], [address].    Family History  Problem Relation Age of Onset  . Alcohol abuse Father   .  Cancer Sister     stomach/?pancreatic  . Colon cancer Brother 54  . Cancer Brother     colon cancer in mid 32's  . Arthritis Daughter     psoratic  . Hyperlipidemia Son   . Diabetes Neg Hx   . Stroke Neg Hx   . Heart disease Neg Hx    Current Outpatient Prescriptions on File Prior to Visit  Medication Sig Dispense Refill  . amLODipine (NORVASC) 10 MG tablet Take 1 tablet (10 mg total) by mouth daily.  30 tablet  0  . aspirin 81 MG tablet Take 81 mg by mouth daily.       Marland Kitchen atorvastatin (LIPITOR) 40 MG tablet Take 1 tablet (40 mg total) by mouth daily.  30 tablet  0  . Coenzyme Q10 10 MG capsule Take 10 mg by mouth daily.        Marland Kitchen COREG 25 MG tablet TAKE 1 TABLET TWICE DAILY.  60 each  1  . Multiple Vitamins-Minerals (MULTIVITAMIN WITH MINERALS) tablet Take 1 tablet by mouth daily.        . Omega-3 Fatty Acids (FISH OIL) 1000 MG CAPS Take 1 capsule by mouth daily.        . cetirizine (ZYRTEC) 10 MG tablet Take 10 mg by mouth daily. Takes as needed      . sildenafil (VIAGRA) 50  MG tablet Take 1 tablet (50 mg total) by mouth as needed for erectile dysfunction.  4 tablet  0    Allergies  Allergen Reactions  . Iodine Other (See Comments)    Feels like he is "burning up"   ROS:  The patient denies anorexia, fever, weight changes, headaches,  vision loss, ear pain, hoarseness, chest pain, palpitations, dizziness, syncope, dyspnea on exertion, cough, swelling, nausea, vomiting, diarrhea, constipation, abdominal pain, melena, hematochezia, indigestion/heartburn, hematuria, incontinence, weakened urine stream, dysuria, genital lesions, joint pains, numbness, tingling, weakness, tremor, suspicious skin lesions, depression, anxiety, abnormal bleeding/bruising, or enlarged lymph nodes +stable hearing loss.  Allergies are controlled.  Up 2-3 times/night to void.  Has discussed with Dr. Dagoberto Ligas in past, and declined medications.  Very rare/short-lived dizziness first thing in the morning.  Sees  dermatologist regularly (Dr. Londell Moh)  PHYSICAL EXAM: BP 142/62  Pulse 64  Ht 5\' 8"  (1.727 m)  Wt 193 lb (87.544 kg)  BMI 29.35 kg/m2 150/60 by MD General Appearance:    Alert, cooperative, no distress, appears stated age  Head:    Normocephalic, without obvious abnormality, atraumatic  Eyes:    PERRL, conjunctiva/corneas clear, EOM's intact, fundi    benign  Ears:    Normal TM's and external ear canals  Nose:   Nares normal, mucosa normal, no drainage or sinus   tenderness  Throat:   Lips, mucosa, and tongue normal; teeth and gums normal  Neck:   Supple, no lymphadenopathy;  thyroid:  no   enlargement/tenderness/nodules; no carotid   bruit or JVD  Back:    Spine nontender, no curvature, ROM normal, no CVA     tenderness  Lungs:     Clear to auscultation bilaterally without wheezes, rales or     ronchi; respirations unlabored  Chest Wall:    No tenderness or deformity   Heart:    Regular rate and rhythm, S1 and S2 normal, no murmur, rub   or gallop  Breast Exam:    No chest wall tenderness, masses or gynecomastia  Abdomen:     Soft, non-tender, nondistended, normoactive bowel sounds,    no masses, no hepatosplenomegaly  Genitalia:    Normal male external genitalia without lesions.  Testicles without masses.  No inguinal hernias.  Rectal:    Normal sphincter tone, no masses or tenderness; guaiac negative stool.  Prostate smooth, no nodules, not enlarged.  Extremities:   No clubbing, cyanosis or edema  Pulses:   2+ and symmetric all extremities  Skin:   Skin color, texture, turgor normal, no rashes or lesions  Lymph nodes:   Cervical, supraclavicular, and axillary nodes normal  Neurologic:   CNII-XII intact, normal strength, sensation and gait; reflexes 2+ and symmetric throughout          Psych:   Normal mood, affect, hygiene and grooming.     ASSESSMENT/PLAN: 1. Routine general medical examination at a health care facility  POCT Urinalysis Dipstick  2. Type II or unspecified  type diabetes mellitus without mention of complication, not stated as uncontrolled  Comprehensive metabolic panel, Hemoglobin A1c  3. Pure hypercholesterolemia  Lipid panel  4. Essential hypertension, benign    5. Erectile dysfunction    6. Encounter for long-term (current) use of other medications  Lipid panel, Comprehensive metabolic panel, CBC with Differential  7. Special screening for malignant neoplasm of prostate  PSA, Medicare   HTN--suboptimally controlled, likely due to noncompliance with low sodium diet, and forgetting second dose frequently.  Discussed  changing back to CR, vs coming up with other techniques to be more compliant with taking PM dose.  He prefers to try harder at taking BID.  Advised to cut back on sodium in diet, exercise daily, check BP's periodically, and f/u in 1 month on BP.  Hyperlipidemia--Will need refill of lipitor when labs back--30 day supply with refills.   ED:  Declines viagra rx today--will call if/when interested.  Effective in past at 50 mg, so if he calls for Rx, change to 100mg , and take just 1/2 tablet to make more cost effective.  DM--diet controlled, due for A1c.  Discussed PSA screening (risks/benefits), recommended at least 30 minutes of aerobic activity at least 5 days/week; proper sunscreen use reviewed; healthy diet and alcohol recommendations (less than or equal to 2 drinks/day) reviewed; regular seatbelt use; changing batteries in smoke detectors. Self-testicular exams. Immunization recommendations discussed--Zostavax encouraged, risks/benefits reviewed again.  Colonoscopy recommendations reviewed--due for repeat.  Patient to call to schedule.

## 2011-11-15 NOTE — Patient Instructions (Addendum)
HEALTH MAINTENANCE RECOMMENDATIONS:  It is recommended that you get at least 30 minutes of aerobic exercise at least 5 days/week (for weight loss, you may need as much as 60-90 minutes). This can be any activity that gets your heart rate up. This can be divided in 10-15 minute intervals if needed, but try and build up your endurance at least once a week.  Weight bearing exercise is also recommended twice weekly.  Eat a healthy diet with lots of vegetables, fruits and fiber.  "Colorful" foods have a lot of vitamins (ie green vegetables, tomatoes, red peppers, etc).  Limit sweet tea, regular sodas and alcoholic beverages, all of which has a lot of calories and sugar.  Up to 2 alcoholic drinks daily may be beneficial for men (unless trying to lose weight, watch sugars).  Drink a lot of water.  Sunscreen of at least SPF 30 should be used on all sun-exposed parts of the skin when outside between the hours of 10 am and 4 pm (not just when at beach or pool, but even with exercise, golf, tennis, and yard work!)  Use a sunscreen that says "broad spectrum" so it covers both UVA and UVB rays, and make sure to reapply every 1-2 hours.  Remember to change the batteries in your smoke detectors when changing your clock times in the spring and fall.  Use your seat belt every time you are in a car, and please drive safely and not be distracted with cell phones and texting while driving.  Call Dr. Randa Evens to schedule colonoscopy. Go ahead and get the shingles vaccine, and let us know the date you got it.  You need to take the Coreg twice daily in order to work as well as the Coreg CR.  Goal Blood Pressure is <130/80.  Low sodium diet is also important in controlling your blood pressure.  If BP's aren't at goal, and you continue to forget second dose of Coreg, consider changing back to the CR  2 Gram Low Sodium Diet A 2 gram sodium diet restricts the amount of sodium in the diet to no more than 2 g or 2000 mg daily.  Limiting the amount of sodium is often used to help lower blood pressure. It is important if you have heart, liver, or kidney problems. Many foods contain sodium for flavor and sometimes as a preservative. When the amount of sodium in a diet needs to be low, it is important to know what to look for when choosing foods and drinks. The following includes some information and guidelines to help make it easier for you to adapt to a low sodium diet. QUICK TIPS  Do not add salt to food.   Avoid convenience items and fast food.   Choose unsalted snack foods.   Buy lower sodium products, often labeled as "lower sodium" or "no salt added."   Check food labels to learn how much sodium is in 1 serving.   When eating at a restaurant, ask that your food be prepared with less salt or none, if possible.  READING FOOD LABELS FOR SODIUM INFORMATION The nutrition facts label is a good place to find how much sodium is in foods. Look for products with no more than 500 to 600 mg of sodium per meal and no more than 150 mg per serving. Remember that 2 g = 2000 mg. The food label may also list foods as:  Sodium-free: Less than 5 mg in a serving.   Very low sodium:  35 mg or less in a serving.   Low-sodium: 140 mg or less in a serving.   Light in sodium: 50% less sodium in a serving. For example, if a food that usually has 300 mg of sodium is changed to become light in sodium, it will have 150 mg of sodium.   Reduced sodium: 25% less sodium in a serving. For example, if a food that usually has 400 mg of sodium is changed to reduced sodium, it will have 300 mg of sodium.  CHOOSING FOODS Grains  Avoid: Salted crackers and snack items. Some cereals, including instant hot cereals. Bread stuffing and biscuit mixes. Seasoned rice or pasta mixes.   Choose: Unsalted snack items. Low-sodium cereals, oats, puffed wheat and rice, shredded wheat. English muffins and bread. Pasta.  Meats  Avoid: Salted, canned, smoked,  spiced, pickled meats, including fish and poultry. Bacon, ham, sausage, cold cuts, hot dogs, anchovies.   Choose: Low-sodium canned tuna and salmon. Fresh or frozen meat, poultry, and fish.  Dairy  Avoid: Processed cheese and spreads. Cottage cheese. Buttermilk and condensed milk. Regular cheese.   Choose: Milk. Low-sodium cottage cheese. Yogurt. Sour cream. Low-sodium cheese.  Fruits and Vegetables  Avoid: Regular canned vegetables. Regular canned tomato sauce and paste. Frozen vegetables in sauces. Olives. Rosita Fire. Relishes. Sauerkraut.   Choose: Low-sodium canned vegetables. Low-sodium tomato sauce and paste. Frozen or fresh vegetables. Fresh and frozen fruit.  Condiments  Avoid: Canned and packaged gravies. Worcestershire sauce. Tartar sauce. Barbecue sauce. Soy sauce. Steak sauce. Ketchup. Onion, garlic, and table salt. Meat flavorings and tenderizers.   Choose: Fresh and dried herbs and spices. Low-sodium varieties of mustard and ketchup. Lemon juice. Tabasco sauce. Horseradish.  SAMPLE 2 GRAM SODIUM MEAL PLAN Breakfast / Sodium (mg)  1 cup low-fat milk / 143 mg   2 slices whole-wheat toast / 270 mg   1 tbs heart-healthy margarine / 153 mg   1 hard-boiled egg / 139 mg   1 small orange / 0 mg  Lunch / Sodium (mg)  1 cup raw carrots / 76 mg    cup hummus / 298 mg   1 cup low-fat milk / 143 mg    cup red grapes / 2 mg   1 whole-wheat pita bread / 356 mg  Dinner / Sodium (mg)  1 cup whole-wheat pasta / 2 mg   1 cup low-sodium tomato sauce / 73 mg   3 oz lean ground beef / 57 mg   1 small side salad (1 cup raw spinach leaves,  cup cucumber,  cup yellow bell pepper) with 1 tsp olive oil and 1 tsp red wine vinegar / 25 mg  Snack / Sodium (mg)  1 container low-fat vanilla yogurt / 107 mg   3 graham cracker squares / 127 mg  Nutrient Analysis  Calories: 2033   Protein: 77 g   Carbohydrate: 282 g   Fat: 72 g   Sodium: 1971 mg  Document Released:  07/26/2005 Document Revised: 07/15/2011 Document Reviewed: 10/27/2009 [facility] Patient Information 2012 [name], Peter.

## 2011-11-16 LAB — CBC WITH DIFFERENTIAL/PLATELET
Basophils Relative: 1 % (ref 0–1)
Eosinophils Absolute: 0.5 10*3/uL (ref 0.0–0.7)
Eosinophils Relative: 9 % — ABNORMAL HIGH (ref 0–5)
Hemoglobin: 13.9 g/dL (ref 13.0–17.0)
Lymphs Abs: 1.3 10*3/uL (ref 0.7–4.0)
MCH: 31.4 pg (ref 26.0–34.0)
MCHC: 32.6 g/dL (ref 30.0–36.0)
MCV: 96.6 fL (ref 78.0–100.0)
Monocytes Relative: 13 % — ABNORMAL HIGH (ref 3–12)
Neutrophils Relative %: 55 % (ref 43–77)
RBC: 4.42 MIL/uL (ref 4.22–5.81)

## 2011-11-16 LAB — HEMOGLOBIN A1C
Hgb A1c MFr Bld: 6.7 % — ABNORMAL HIGH (ref <5.7)
Mean Plasma Glucose: 146 mg/dL — ABNORMAL HIGH (ref <117)

## 2011-11-16 LAB — COMPREHENSIVE METABOLIC PANEL
ALT: 19 U/L (ref 0–53)
Albumin: 4.3 g/dL (ref 3.5–5.2)
Alkaline Phosphatase: 55 U/L (ref 39–117)
CO2: 31 mEq/L (ref 19–32)
Glucose, Bld: 116 mg/dL — ABNORMAL HIGH (ref 70–99)
Potassium: 4.3 mEq/L (ref 3.5–5.3)
Sodium: 140 mEq/L (ref 135–145)
Total Bilirubin: 0.8 mg/dL (ref 0.3–1.2)
Total Protein: 6.5 g/dL (ref 6.0–8.3)

## 2011-11-16 LAB — LIPID PANEL: LDL Cholesterol: 55 mg/dL (ref 0–99)

## 2011-11-16 MED ORDER — ATORVASTATIN CALCIUM 40 MG PO TABS: 40.0000 mg | ORAL_TABLET | Freq: Every day | ORAL | Status: DC

## 2011-11-17 ENCOUNTER — Other Ambulatory Visit: Payer: Self-pay | Admitting: *Deleted

## 2011-11-17 DIAGNOSIS — E119 Type 2 diabetes mellitus without complications: Secondary | ICD-10-CM

## 2011-11-23 ENCOUNTER — Other Ambulatory Visit: Payer: Self-pay | Admitting: Family Medicine

## 2011-12-16 ENCOUNTER — Ambulatory Visit (INDEPENDENT_AMBULATORY_CARE_PROVIDER_SITE_OTHER): Payer: Medicare Other | Admitting: Family Medicine

## 2011-12-16 ENCOUNTER — Encounter: Payer: Self-pay | Admitting: Family Medicine

## 2011-12-16 VITALS — BP 132/62 | HR 68 | Ht 68.0 in | Wt 193.0 lb

## 2011-12-16 DIAGNOSIS — Z79899 Other long term (current) drug therapy: Secondary | ICD-10-CM

## 2011-12-16 DIAGNOSIS — E119 Type 2 diabetes mellitus without complications: Secondary | ICD-10-CM

## 2011-12-16 DIAGNOSIS — I10 Essential (primary) hypertension: Secondary | ICD-10-CM

## 2011-12-16 DIAGNOSIS — E7800 Pure hypercholesterolemia, unspecified: Secondary | ICD-10-CM

## 2011-12-16 NOTE — Patient Instructions (Signed)
Continue to try and follow a low sodium diet--use the salt shaker less, and be mindful of foods already high in sodium.  Continue to try and remember to take the medicine in the evenings as well--your blood pressure was much improved.    At some point, please bring in your machine for [place] to verify the accuracy of it.  If your blood pressure is truly running in the 140's at home, and the machine is accurate, I would adjust your medications.

## 2011-12-16 NOTE — Progress Notes (Signed)
Patient presents for 1 month follow up on hypertension.  At his physical last month, BP was elevated, and he admitted to frequently forgetting PM dose of Coreg.  We had discussed changing back to CR vs trying harder to remember BID dosing, and he preferred the latter.  We also discussed cutting back further on his sodium intake.  Recent trip to [place], [place] and [place], [place] and admits to eating and drinking more than usual. BP's at home are running 140's/60.  He thinks his machine reads high, but he has never brought it in to check. Does add salt to his food, but trying to taste before adding now.  Past Medical History  Diagnosis Date  . Hypertension age 78  . Diabetes mellitus diet controlled  . Hiatal hernia   . Hypercholesteremia   . FHx: colon cancer   . Asthma h/o as a child-s/p immunotherapy   Past Surgical History  Procedure Date  . Cardiovascular stress test normal  . Appendectomy   . Vasectomy   . Colonoscopy 3/08   Current Outpatient Prescriptions on File Prior to Visit  Medication Sig Dispense Refill  . amLODipine (NORVASC) 10 MG tablet TAKE 1 TABLET ONCE DAILY.  30 tablet  5  . aspirin 81 MG tablet Take 81 mg by mouth daily.       Marland Kitchen atorvastatin (LIPITOR) 40 MG tablet Take 1 tablet (40 mg total) by mouth daily.  30 tablet  5  . cetirizine (ZYRTEC) 10 MG tablet Take 10 mg by mouth daily. Takes as needed      . Coenzyme Q10 10 MG capsule Take 10 mg by mouth daily.        Marland Kitchen COREG 25 MG tablet TAKE 1 TABLET TWICE DAILY.  60 each  1  . Magnesium 200 MG TABS Take 1 tablet by mouth daily.      . Multiple Vitamins-Minerals (MULTIVITAMIN WITH MINERALS) tablet Take 1 tablet by mouth daily.        . Omega-3 Fatty Acids (FISH OIL) 1000 MG CAPS Take 1 capsule by mouth daily.        . sildenafil (VIAGRA) 50 MG tablet Take 1 tablet (50 mg total) by mouth as needed for erectile dysfunction.  4 tablet  0   Allergies  Allergen Reactions  . Iodine Other (See Comments)    Feels  like he is "burning up"    ROS:  Denies headaches, dizziness, chest pain, edema, or other concerns. No URI symptoms, rashes, shortness of breath.  PHYSICAL EXAM: BP 132/62  Pulse 68  Ht 5\' 8"  (1.727 m)  Wt 193 lb (87.544 kg)  BMI 29.35 kg/m2 Well developed, pleasant male in no distress Heart: regular rate and rhythm without murmur Lungs: clear bilaterally Extremities: no edema Psych: normal mood, affect, hygiene and grooming  ASSESSMENT/PLAN:  1. Essential hypertension, benign   HTN--improved control with better compliance with BID dosing of meds.  Continue current med regimen.  Encouraged further decrease in sodium intake.  Need to verify accuracy of BP monitor.   5 month f/u--labs prior.  Already scheduled for A1c in July (since increased in April)

## 2012-01-11 ENCOUNTER — Other Ambulatory Visit: Payer: Self-pay | Admitting: Family Medicine

## 2012-02-17 ENCOUNTER — Other Ambulatory Visit: Payer: Medicare Other

## 2012-05-11 ENCOUNTER — Ambulatory Visit (INDEPENDENT_AMBULATORY_CARE_PROVIDER_SITE_OTHER): Payer: Medicare Other | Admitting: Cardiovascular Disease

## 2012-05-11 ENCOUNTER — Encounter: Payer: Self-pay | Admitting: Cardiovascular Disease

## 2012-05-11 VITALS — BP 134/68 | HR 78 | Ht 68.0 in | Wt 194.0 lb

## 2012-05-11 DIAGNOSIS — I10 Essential (primary) hypertension: Secondary | ICD-10-CM

## 2012-05-11 DIAGNOSIS — E7800 Pure hypercholesterolemia, unspecified: Secondary | ICD-10-CM

## 2012-05-11 NOTE — Patient Instructions (Addendum)
Your physician wants you to follow-up in: 6 months  You will receive a reminder letter in the mail two months in advance. If you don't receive a letter, please call our office to schedule the follow-up appointment.  Your physician recommends that you return for a FASTING lipid profile: 6 months   

## 2012-05-11 NOTE — Progress Notes (Signed)
Gardiner Barefoot Date of Birth  1929/10/24 [facility] [address]     [address] [place], [place]  [address] [phone]  Fax  [phone]   Problems 1. Htn 2. DM 3. Hyperlipidemia 4. Elevated coronary calcium   History of Present Illness:  78 yo patient of Dr. Reyes Ivan.  Hx of diabetes, HTN, dyslipidemia, and elevated coronary calcium score.  Also has mild-moderate carotid disease.  Is active but he doesn't walk regularly.  Has a cabin in [place] of [place].  No chest pain or dyspnea with his usual activities.    Oct. 3, 2013 -  He has not had any cardiac problems - no chest pain.  He stays avtive but does not walk regularly.  He describes some symptoms c/w claudication but has great pulses in his feet.  His systolic BP has been a bit elevated.  Current Outpatient Prescriptions on File Prior to Visit  Medication Sig Dispense Refill  . amLODipine (NORVASC) 10 MG tablet TAKE 1 TABLET ONCE DAILY.  30 tablet  5  . aspirin 81 MG tablet Take 81 mg by mouth daily.       Marland Kitchen atorvastatin (LIPITOR) 40 MG tablet Take 1 tablet (40 mg total) by mouth daily.  30 tablet  5  . cetirizine (ZYRTEC) 10 MG tablet Take 10 mg by mouth daily. Takes as needed      . Coenzyme Q10 10 MG capsule Take 100 mg by mouth daily.       Marland Kitchen COREG 25 MG tablet TAKE 1 TABLET TWICE DAILY.  60 each  5  . Magnesium 200 MG TABS Take 1 tablet by mouth daily.      . Multiple Vitamins-Minerals (MULTIVITAMIN WITH MINERALS) tablet Take 1 tablet by mouth daily.        . Omega-3 Fatty Acids (FISH OIL) 1000 MG CAPS Take 1 capsule by mouth daily.          Allergies  Allergen Reactions  . Iodine Other (See Comments)    Feels like he is "burning up"    Past Medical History  Diagnosis Date  . Hypertension age 55  . Diabetes mellitus diet controlled  . Hiatal hernia   . Hypercholesteremia   . FHx: colon cancer   . Asthma h/o as a child-s/p immunotherapy    Past Surgical History    Procedure Date  . Cardiovascular stress test normal  . Appendectomy   . Vasectomy   . Colonoscopy 3/08    History  Smoking status  . Former Smoker  . Quit date: 08/09/1974  Smokeless tobacco  . Never Used    History  Alcohol Use  . Yes    1-2 drinks daily    Family History  Problem Relation Age of Onset  . Alcohol abuse Father   . Cancer Sister     stomach/?pancreatic  . Colon cancer Brother 24  . Cancer Brother     colon cancer in mid 21's  . Arthritis Daughter     psoratic  . Hyperlipidemia Son   . Diabetes Neg Hx   . Stroke Neg Hx   . Heart disease Neg Hx     Reviw of Systems:  Reviewed in the HPI.  All other systems are negative.  Physical Exam: BP 134/68  Pulse 78  Ht 5\' 8"  (1.727 m)  Wt 194 lb (87.998 kg)  BMI 29.50 kg/m2  SpO2 99% The patient is alert and oriented x 3.  The  mood and affect are normal.   Skin: warm and dry.  Color is normal.    HEENT:   the sclera are nonicteric.  The mucous membranes are moist.  The carotids are 2+ without bruits.  There is no thyromegaly.  There is no JVD.    Lungs: clear.  The chest wall is non tender.    Heart: regular rate with a normal S1 and S2.  There are no murmurs, gallops, or rubs. The PMI is not displaced.     Abdomin: good bowel sounds.  There is no guarding or rebound.  There is no hepatosplenomegaly or tenderness.  There are no masses.   Extremities:  no clubbing, cyanosis, or edema.  The legs are without rashes.  The distal pulses are intact.   Neuro:  Cranial nerves II - XII are intact.  Motor and sensory functions are intact.    The gait is normal.  ECG:  Oct. 3, 2013 - NSR at 72.  No ST or T wave changes   Assessment / Plan:

## 2012-05-11 NOTE — Assessment & Plan Note (Signed)
His BP is fairly well controlled.  Continue current meds.  Will see him in the office in 6 months.

## 2012-05-11 NOTE — Assessment & Plan Note (Signed)
Continue current dose of atorvastatin.  Will check labs again at OV in 6 months.

## 2012-05-23 ENCOUNTER — Other Ambulatory Visit: Payer: Medicare Other

## 2012-05-23 DIAGNOSIS — E119 Type 2 diabetes mellitus without complications: Secondary | ICD-10-CM

## 2012-05-23 DIAGNOSIS — Z79899 Other long term (current) drug therapy: Secondary | ICD-10-CM

## 2012-05-23 DIAGNOSIS — E7800 Pure hypercholesterolemia, unspecified: Secondary | ICD-10-CM

## 2012-05-23 NOTE — Progress Notes (Unsigned)
Pt came in for blood work and request that we check his B/P

## 2012-05-24 LAB — HEPATIC FUNCTION PANEL
Bilirubin, Direct: 0.1 mg/dL (ref 0.0–0.3)
Indirect Bilirubin: 0.4 mg/dL (ref 0.0–0.9)
Total Bilirubin: 0.5 mg/dL (ref 0.3–1.2)

## 2012-05-24 LAB — HEMOGLOBIN A1C
Hgb A1c MFr Bld: 6.5 % — ABNORMAL HIGH (ref <5.7)
Mean Plasma Glucose: 140 mg/dL — ABNORMAL HIGH (ref <117)

## 2012-05-24 LAB — LIPID PANEL
LDL Cholesterol: 73 mg/dL (ref 0–99)
Total CHOL/HDL Ratio: 2.7 Ratio
VLDL: 22 mg/dL (ref 0–40)

## 2012-05-25 ENCOUNTER — Encounter: Payer: Self-pay | Admitting: Family Medicine

## 2012-05-25 ENCOUNTER — Ambulatory Visit (INDEPENDENT_AMBULATORY_CARE_PROVIDER_SITE_OTHER): Payer: Medicare Other | Admitting: Family Medicine

## 2012-05-25 VITALS — BP 138/70 | HR 64 | Ht 68.0 in | Wt 194.0 lb

## 2012-05-25 DIAGNOSIS — E7800 Pure hypercholesterolemia, unspecified: Secondary | ICD-10-CM

## 2012-05-25 DIAGNOSIS — N529 Male erectile dysfunction, unspecified: Secondary | ICD-10-CM

## 2012-05-25 DIAGNOSIS — E119 Type 2 diabetes mellitus without complications: Secondary | ICD-10-CM

## 2012-05-25 DIAGNOSIS — Z23 Encounter for immunization: Secondary | ICD-10-CM

## 2012-05-25 DIAGNOSIS — Z79899 Other long term (current) drug therapy: Secondary | ICD-10-CM

## 2012-05-25 DIAGNOSIS — Z125 Encounter for screening for malignant neoplasm of prostate: Secondary | ICD-10-CM

## 2012-05-25 DIAGNOSIS — I10 Essential (primary) hypertension: Secondary | ICD-10-CM

## 2012-05-25 MED ORDER — VARDENAFIL HCL 20 MG PO TABS: 10.0000 mg | ORAL_TABLET | Freq: Every day | ORAL | Status: DC | PRN

## 2012-05-25 MED ORDER — SILDENAFIL CITRATE 100 MG PO TABS: 50.0000 mg | ORAL_TABLET | Freq: Every day | ORAL | Status: DC | PRN

## 2012-05-25 MED ORDER — INFLUENZA VIRUS VACC SPLIT PF IM SUSP: 0.5000 mL | Freq: Once | INTRAMUSCULAR | Status: DC

## 2012-05-25 MED ORDER — TADALAFIL 20 MG PO TABS: 10.0000 mg | ORAL_TABLET | ORAL | Status: DC | PRN

## 2012-05-25 NOTE — Patient Instructions (Addendum)
  Lab Results  Component Value Date   CHOL 151 05/23/2012   HDL 56 05/23/2012   LDLCALC 73 05/23/2012   TRIG 112 05/23/2012   CHOLHDL 2.7 05/23/2012   Lab Results  Component Value Date   ALT 17 05/23/2012   AST 22 05/23/2012   ALKPHOS 61 05/23/2012   BILITOT 0.5 05/23/2012   Lab Results  Component Value Date   HGBA1C 6.5* 05/23/2012   Fasting glucose 125  Please schedule nurse visit and bring your blood pressure monitor to have it checked.  Check blood pressure at different times of the day, to see if there are fluctuations. Low carb, low sodium diet, and avoid too much candy!  Try and exercise daily.

## 2012-05-25 NOTE — Progress Notes (Signed)
Chief Complaint  Patient presents with  . Hypertension    med check, labs done 05/23/12.   HPI:  Hypertension follow-up:  Blood pressures at home are 140-150/60-70, pulse 68-72 (per patient's recall).  Was lower at Dr. Harvie Bridge office, so thinks his monitor is running high. He checks his BP in the morning only.  Denies dizziness, headaches, chest pain.  Denies side effects of medications. Admits to still forgetting PM dose of Coreg. He is very inconsistent in the time of day that he takes his medication, and only takes it once daily.  Recently saw Dr. Elease Hashimoto, had EKG  Hyperlipidemia follow-up:  Patient is reportedly following a low-fat, low cholesterol diet.  Compliant with medications and denies medication side effects  Diabetes follow-up:  Blood sugars are not checked at home. Denies polydipsia and polyuria (just nocturia--up 3x/night).  Last eye exam was within the year.  Patient admits to eating a lot of candy, and not really following any specific diabetic diet.  Hasn't gotten shingles shot yet. Has further questions; still has written Rx to get at pharmacy.  ED--Tried Viagra in the past.  Worked, but erection was short-lived (10 mins).  Only tried 50mg  dose. He would like to try Cialis.  Libido is good.  Past Medical History  Diagnosis Date  . Hypertension age 54  . Diabetes mellitus diet controlled  . Hiatal hernia   . Hypercholesteremia   . FHx: colon cancer   . Asthma h/o as a child-s/p immunotherapy   Past Surgical History  Procedure Date  . Cardiovascular stress test normal  . Appendectomy   . Vasectomy   . Colonoscopy 3/08   History   Social History  . Marital Status: Married    Spouse Name: N/A    Number of Children: 8  . Years of Education: N/A   Occupational History  . 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 Smoking status: Former Smoker    Quit date: 08/09/1974  . Smokeless tobacco: Never Used  . Alcohol Use: Yes     1-2 drinks daily    . Drug Use: No  . Sexually Active: Yes -- Male partner(s)   Other Topics Concern  . Not on file   Social History Narrative   Lives with wife, dog.  Children live in [place], [place], [place], [place], and 2 daughters here in [place], [address].   Current outpatient prescriptions:amLODipine (NORVASC) 10 MG tablet, TAKE 1 TABLET ONCE DAILY., Disp: 30 tablet, Rfl: 5;  aspirin 81 MG tablet, Take 81 mg by mouth daily. , Disp: , Rfl: ;  atorvastatin (LIPITOR) 40 MG tablet, Take 1 tablet (40 mg total) by mouth daily., Disp: 30 tablet, Rfl: 5;  Coenzyme Q10 10 MG capsule, Take 100 mg by mouth daily. , Disp: , Rfl: ;  COREG 25 MG tablet, TAKE 1 TABLET TWICE DAILY., Disp: 60 each, Rfl: 5 Multiple Vitamins-Minerals (MULTIVITAMIN WITH MINERALS) tablet, Take 1 tablet by mouth daily.  , Disp: , Rfl: ;  cetirizine (ZYRTEC) 10 MG tablet, Take 10 mg by mouth daily. Takes as needed, Disp: , Rfl: ;  Omega-3 Fatty Acids (FISH OIL) 1000 MG CAPS, Take 1 capsule by mouth daily.  , Disp: , Rfl:  Current facility-administered medications:influenza  inactive virus vaccine (FLUZONE/FLUARIX) injection 0.5 mL, 0.5 mL, Intramuscular, Once, Joselyn Arrow, MD  Allergies  Allergen Reactions  . Iodine Other (See Comments)    Feels like he is "burning up"   ROS: denies headaches,  dizziness, chest pain, shortness of breath, edema, nausea, vomiting, bowel changes, abdominal pain.  Denies URI symptoms, cough, shortness of breath, fever.  Denies bleeding/bruising. +ED, nocturia.  See HPI  PHYSICAL EXAM: BP 138/70  Pulse 64  Ht 5\' 8"  (1.727 m)  Wt 194 lb (87.998 kg)  BMI 29.50 kg/m2 Well developed, pleasant male in no distress  Heart: regular rate and rhythm without murmur  Lungs: clear bilaterally, no wheezes, rales, ronchi Abdomen: soft, nontender, no organomegaly or mass Extremities: no edema, 2+ pulse Neuro: alert and oriented. Normal gait, strength.  Cranial nerves grossly intact Psych: normal mood, affect, hygiene and  grooming   Lab Results  Component Value Date   CHOL 151 05/23/2012   HDL 56 05/23/2012   LDLCALC 73 05/23/2012   TRIG 112 05/23/2012   CHOLHDL 2.7 05/23/2012   Lab Results  Component Value Date   ALT 17 05/23/2012   AST 22 05/23/2012   ALKPHOS 61 05/23/2012   BILITOT 0.5 05/23/2012   Lab Results  Component Value Date   HGBA1C 6.5* 05/23/2012   Fasting glucose 125  ASSESSMENT/PLAN:  1. Essential hypertension, benign    2. Need for prophylactic vaccination and inoculation against influenza  influenza  inactive virus vaccine (FLUZONE/FLUARIX) injection 0.5 mL  3. Pure hypercholesterolemia    4. Type II or unspecified type diabetes mellitus without mention of complication, not stated as uncontrolled    5. ED (erectile dysfunction)  vardenafil (LEVITRA) 20 MG tablet, tadalafil (CIALIS) 20 MG tablet, sildenafil (VIAGRA) 100 MG tablet   Counseled extensively regarding appropriate diabetic diet, lowfat, low cholesterol diet.  Encouraged to follow low sodium diet.  Check BP twice daily.  Recommend nurse visit to check monitor for accuracy (vs fluctuations in BP due to taking BID medication just once daily).  Needs to develop a routine to take meds Discussed proper technique of using his BP monitor (he is holding his arm up, above heart level, and not resting on anything).  ED--given vouchers and rx's for free trials of Viagra and Cialis, and given sample of Levitra.  Call for rx for whichever is most effective.  Advised to start at 1/2 tablet, increase to full if ineffective at lower dose.  6 months for CPE, med check, with fasting labs prior.   cc labs to Dr. Elease Hashimoto

## 2012-05-26 ENCOUNTER — Encounter: Payer: Self-pay | Admitting: Family Medicine

## 2012-06-01 ENCOUNTER — Telehealth: Payer: Self-pay | Admitting: Internal Medicine

## 2012-06-01 NOTE — Telephone Encounter (Signed)
Pharmacist advised/verbal order given, okay for zostavax

## 2012-06-14 ENCOUNTER — Encounter: Payer: Self-pay | Admitting: *Deleted

## 2012-07-10 ENCOUNTER — Other Ambulatory Visit: Payer: Self-pay | Admitting: Family Medicine

## 2012-07-12 ENCOUNTER — Telehealth: Payer: Self-pay | Admitting: *Deleted

## 2012-07-12 DIAGNOSIS — N529 Male erectile dysfunction, unspecified: Secondary | ICD-10-CM

## 2012-07-12 MED ORDER — TADALAFIL 5 MG PO TABS: 2.5000 mg | ORAL_TABLET | Freq: Every day | ORAL | Status: DC | PRN

## 2012-07-12 NOTE — Telephone Encounter (Signed)
I recommend starting at the 2.5mg  daily.  We can give him rx for 5mg  (to get 30 free tablets, if has voucher), but take 1/2 daily. If ineffective, increase to full tablet daily.  Can you call in rx, or does it need to be printed? PRINTED

## 2012-07-12 NOTE — Telephone Encounter (Signed)
Patient informed to start 2.5mg  and increase only if needed. He will pick up rx tomorrow.

## 2012-07-12 NOTE — Telephone Encounter (Signed)
Patient dropped of rx for Cialis 20mg . I called him and he stated that he has been doing some research and he would like to take the 5mg  daily. Can you please re-write rx and he will come pick up in the am. I did give him back discount card.

## 2012-09-09 ENCOUNTER — Other Ambulatory Visit: Payer: Self-pay | Admitting: Family Medicine

## 2012-09-12 ENCOUNTER — Telehealth: Payer: Self-pay | Admitting: Family Medicine

## 2012-09-12 NOTE — Telephone Encounter (Signed)
There is no record of him being on this medication in computer.  Please pull his paper chart and put on my shelf (upper left shelf) and re-route this back to me to address, and put faxed request with chart.  Thanks

## 2012-09-12 NOTE — Telephone Encounter (Signed)
Chart is on your shelf.

## 2012-09-13 ENCOUNTER — Telehealth: Payer: Self-pay | Admitting: *Deleted

## 2012-09-13 NOTE — Telephone Encounter (Signed)
Per fax, was last rx'd in 09/2010.  We have never discussed this med, and he is requesting an unusual amt (2 month supply), so would prefer OV since never discussed or rx'd by me, to make sure he has proper directions, etc.

## 2012-09-13 NOTE — Telephone Encounter (Signed)
Spoke with patient and he said he doesn't use this medication on a daily or even monthly basis-he has a trip to [place] planned in the next couple of weeks and wanted to take this with him, he uses for allergies prn. He said that he is going to see Dr.Nahser next week and he will see if he will just give him a one time rx.

## 2012-11-07 ENCOUNTER — Other Ambulatory Visit (INDEPENDENT_AMBULATORY_CARE_PROVIDER_SITE_OTHER): Payer: Medicare Other

## 2012-11-07 ENCOUNTER — Encounter: Payer: Self-pay | Admitting: Cardiovascular Disease

## 2012-11-07 ENCOUNTER — Ambulatory Visit (INDEPENDENT_AMBULATORY_CARE_PROVIDER_SITE_OTHER): Payer: Medicare Other | Admitting: Cardiovascular Disease

## 2012-11-07 VITALS — BP 132/60 | HR 65 | Ht 68.0 in | Wt 193.1 lb

## 2012-11-07 DIAGNOSIS — R0789 Other chest pain: Secondary | ICD-10-CM | POA: Insufficient documentation

## 2012-11-07 DIAGNOSIS — E7800 Pure hypercholesterolemia, unspecified: Secondary | ICD-10-CM

## 2012-11-07 DIAGNOSIS — R0989 Other specified symptoms and signs involving the circulatory and respiratory systems: Secondary | ICD-10-CM

## 2012-11-07 DIAGNOSIS — I10 Essential (primary) hypertension: Secondary | ICD-10-CM

## 2012-11-07 DIAGNOSIS — E785 Hyperlipidemia, unspecified: Secondary | ICD-10-CM

## 2012-11-07 MED ORDER — ATORVASTATIN CALCIUM 40 MG PO TABS: 40.0000 mg | ORAL_TABLET | Freq: Every day | ORAL | Status: DC

## 2012-11-07 NOTE — Assessment & Plan Note (Signed)
Peter [name] presents today for followup of his hypertension. He incidentally noted that he occasionally has some chest tightness with ambulation. He's had these same symptoms for years and in fact has had a stress test in the past which was negative. He is not very concerned about I did remind him that that was of some concern. Apply to exercise on a regular basis and if he is limited by chest tightness which should repeat a stress test.  I'll see him again in 6 months and we will reassess this issue.

## 2012-11-07 NOTE — Patient Instructions (Addendum)
Your physician recommends that you return for a FASTING lipid profile: with your pcp, i gave script to have liver panel done  Your physician wants you to follow-up in: 6 months with ekg  You will receive a reminder letter in the mail two months in advance. If you don't receive a letter, please call our office to schedule the follow-up appointment.

## 2012-11-07 NOTE — Assessment & Plan Note (Signed)
His cholesterol levels are followed by his general medical Dr.

## 2012-11-07 NOTE — Progress Notes (Signed)
Peter [name] Date of Birth  08/03/1930 [facility] [address]     [address] [place], [place]  [address] [phone]  Fax  [phone]   Problems 1. Htn 2. DM 3. Hyperlipidemia 4. Elevated coronary calcium   History of Present Illness:  79 yo patient of Dr. Reyes Ivan.  Hx of diabetes, HTN, dyslipidemia, and elevated coronary calcium score.  Also has mild-moderate carotid disease.  Is active but he doesn't walk regularly.  Has a cabin in [place] of [place].  No chest pain or dyspnea with his usual activities.    Oct. 3, 2013 -  He has not had any cardiac problems - no chest pain.  He stays avtive but does not walk regularly.  He describes some symptoms c/w claudication but has great pulses in his feet.  His systolic BP has been a bit elevated.  November 07, 2012:  Peter [name] is doing well.  He has been going up to his cabin in [place] of [place] periodically.    He measures his BP regularly - some times his systolic readings are a bit high.  He wants to start walking regularly.    He does have some chest tightness when he walks.  He has had stress nuclear tests with Dr Reyes Ivan which were negative.  He also has a hiatal hernia and esophageal spasm ( according to radiologist)  .     Current Outpatient Prescriptions on File Prior to Visit  Medication Sig Dispense Refill  . amLODipine (NORVASC) 10 MG tablet TAKE 1 TABLET ONCE DAILY.  90 tablet  1  . aspirin 81 MG tablet Take 81 mg by mouth daily.       Marland Kitchen atorvastatin (LIPITOR) 40 MG tablet TAKE 1 TABLET ONCE DAILY.  90 tablet  0  . cetirizine (ZYRTEC) 10 MG tablet Take 10 mg by mouth daily. Takes as needed      . Coenzyme Q10 10 MG capsule Take 100 mg by mouth daily.       Marland Kitchen COREG 25 MG tablet TAKE 1 TABLET TWICE DAILY.  60 each  5  . sildenafil (VIAGRA) 100 MG tablet Take 0.5-1 tablets (50-100 mg total) by mouth daily as needed for erectile dysfunction.  3 tablet  0  . tadalafil (CIALIS) 5 MG tablet  Take 0.5-1 tablets (2.5-5 mg total) by mouth daily as needed for erectile dysfunction.  30 tablet  0  . vardenafil (LEVITRA) 20 MG tablet Take 0.5-1 tablets (10-20 mg total) by mouth daily as needed for erectile dysfunction.  2 tablet  0   No current facility-administered medications on file prior to visit.    Allergies  Allergen Reactions  . Iodine Other (See Comments)    Feels like he is "burning up"    Past Medical History  Diagnosis Date  . Hypertension age 6  . Diabetes mellitus diet controlled  . Hiatal hernia   . Hypercholesteremia   . FHx: colon cancer   . Asthma h/o as a child-s/p immunotherapy    Past Surgical History  Procedure Laterality Date  . Cardiovascular stress test  normal  . Appendectomy    . Vasectomy    . Colonoscopy  3/08    History  Smoking status  . Former Smoker  . Quit date: 08/09/1974  Smokeless tobacco  . Never Used    History  Alcohol Use  . Yes    Comment: 1-2 drinks daily    Family History  Problem Relation  Age of Onset  . Alcohol abuse Father   . Cancer Sister     stomach/?pancreatic  . Colon cancer Brother 90  . Cancer Brother     colon cancer in mid 3's  . Arthritis Daughter     psoratic  . Hyperlipidemia Son   . Diabetes Neg Hx   . Stroke Neg Hx   . Heart disease Neg Hx     Reviw of Systems:  Reviewed in the HPI.  All other systems are negative.  Physical Exam: BP 132/60  Pulse 65  Ht 5\' 8"  (1.727 m)  Wt 193 lb 1.9 oz (87.599 kg)  BMI 29.37 kg/m2  SpO2 97% The patient is alert and oriented x 3.  The mood and affect are normal.   Skin: warm and dry.  Color is normal.    HEENT:   the sclera are nonicteric.  The mucous membranes are moist.  The carotids are 2+ without bruits.  There is no thyromegaly.  There is no JVD.    Lungs: clear.  The chest wall is non tender.    Heart: regular rate with a normal S1 and S2.  There are no murmurs, gallops, or rubs. The PMI is not displaced.     Abdomin: good bowel  sounds.  There is no guarding or rebound.  There is no hepatosplenomegaly or tenderness.  There are no masses.   Extremities:  no clubbing, cyanosis, or edema.  The legs are without rashes.  The distal pulses are intact ( 2-3+)   Neuro:  Cranial nerves II - XII are intact.  Motor and sensory functions are intact.    The gait is normal.  ECG:  Oct. 3, 2013 - NSR at 14.  No ST or T wave changes   Assessment / Plan:

## 2012-11-07 NOTE — Assessment & Plan Note (Signed)
His BP is normal today.  His readings at home are sometimes elevated. I instructed him on the proper use of his blood pressure cuff.  I've asked him to exercise on a regular basis which will help with his systolic hypertension.

## 2012-11-15 ENCOUNTER — Other Ambulatory Visit: Payer: Medicare Other

## 2012-11-15 ENCOUNTER — Telehealth: Payer: Self-pay | Admitting: *Deleted

## 2012-11-15 DIAGNOSIS — E7800 Pure hypercholesterolemia, unspecified: Secondary | ICD-10-CM

## 2012-11-15 DIAGNOSIS — Z125 Encounter for screening for malignant neoplasm of prostate: Secondary | ICD-10-CM

## 2012-11-15 DIAGNOSIS — Z79899 Other long term (current) drug therapy: Secondary | ICD-10-CM

## 2012-11-15 DIAGNOSIS — E119 Type 2 diabetes mellitus without complications: Secondary | ICD-10-CM

## 2012-11-15 LAB — CBC WITH DIFFERENTIAL/PLATELET
Eosinophils Absolute: 0.4 10*3/uL (ref 0.0–0.7)
Eosinophils Relative: 7 % — ABNORMAL HIGH (ref 0–5)
HCT: 38.8 % — ABNORMAL LOW (ref 39.0–52.0)
Hemoglobin: 13 g/dL (ref 13.0–17.0)
Lymphocytes Relative: 21 % (ref 12–46)
Lymphs Abs: 1.1 10*3/uL (ref 0.7–4.0)
MCH: 31.5 pg (ref 26.0–34.0)
MCV: 93.9 fL (ref 78.0–100.0)
Monocytes Relative: 14 % — ABNORMAL HIGH (ref 3–12)
RBC: 4.13 MIL/uL — ABNORMAL LOW (ref 4.22–5.81)

## 2012-11-15 NOTE — Telephone Encounter (Signed)
Patient came in for labs today, brought in rx from Dr.Nahser to have liver profile done. He had a BMET done for you today, can I fax this result to Dr.Nahser tomorrow?

## 2012-11-15 NOTE — Telephone Encounter (Signed)
yes

## 2012-11-16 LAB — COMPREHENSIVE METABOLIC PANEL
AST: 17 U/L (ref 0–37)
Albumin: 4.4 g/dL (ref 3.5–5.2)
Alkaline Phosphatase: 61 U/L (ref 39–117)
BUN: 21 mg/dL (ref 6–23)
Potassium: 4.9 mEq/L (ref 3.5–5.3)
Sodium: 138 mEq/L (ref 135–145)
Total Bilirubin: 0.6 mg/dL (ref 0.3–1.2)

## 2012-11-16 LAB — LIPID PANEL
Cholesterol: 139 mg/dL (ref 0–200)
HDL: 56 mg/dL (ref >39)
Total CHOL/HDL Ratio: 2.5 Ratio
VLDL: 12 mg/dL (ref 0–40)

## 2012-11-16 LAB — MICROALBUMIN / CREATININE URINE RATIO
Creatinine, Urine: 114.7 mg/dL
Microalb Creat Ratio: 5 mg/g (ref 0.0–30.0)
Microalb, Ur: 0.57 mg/dL (ref 0.00–1.89)

## 2012-11-16 LAB — TSH: TSH: 4.026 u[IU]/mL (ref 0.350–4.500)

## 2012-11-20 ENCOUNTER — Other Ambulatory Visit: Payer: Medicare Other

## 2012-11-22 ENCOUNTER — Ambulatory Visit (INDEPENDENT_AMBULATORY_CARE_PROVIDER_SITE_OTHER): Payer: Medicare Other | Admitting: Family Medicine

## 2012-11-22 ENCOUNTER — Encounter: Payer: Self-pay | Admitting: Family Medicine

## 2012-11-22 VITALS — BP 150/70 | HR 72 | Ht 68.0 in | Wt 195.0 lb

## 2012-11-22 DIAGNOSIS — I10 Essential (primary) hypertension: Secondary | ICD-10-CM

## 2012-11-22 DIAGNOSIS — J309 Allergic rhinitis, unspecified: Secondary | ICD-10-CM | POA: Insufficient documentation

## 2012-11-22 DIAGNOSIS — E119 Type 2 diabetes mellitus without complications: Secondary | ICD-10-CM

## 2012-11-22 DIAGNOSIS — E7800 Pure hypercholesterolemia, unspecified: Secondary | ICD-10-CM

## 2012-11-22 DIAGNOSIS — N529 Male erectile dysfunction, unspecified: Secondary | ICD-10-CM

## 2012-11-22 DIAGNOSIS — Z0000 Encounter for general adult medical examination without abnormal findings: Secondary | ICD-10-CM

## 2012-11-22 LAB — POCT URINALYSIS DIPSTICK
Blood, UA: NEGATIVE
Ketones, UA: NEGATIVE
Protein, UA: NEGATIVE
Spec Grav, UA: 1.015
pH, UA: 5

## 2012-11-22 MED ORDER — FLUTICASONE PROPIONATE 50 MCG/ACT NA SUSP: 2.0000 | Freq: Every day | NASAL | Status: DC

## 2012-11-22 MED ORDER — SILDENAFIL CITRATE 100 MG PO TABS: 50.0000 mg | ORAL_TABLET | Freq: Every day | ORAL | Status: DC | PRN

## 2012-11-22 NOTE — Patient Instructions (Addendum)

## 2012-11-22 NOTE — Progress Notes (Signed)
Chief Complaint  Patient presents with  . Annual Exam    nonfasting annual exam, labs already done. No major concerns. Needs refill on Flonase orginally rx'd by dermatologist-Dr.Houston.   Peter [name] is a 79 y.o. male who presents for a complete physical.  He has the following concerns:  Diabetes:  He doesn't check blood sugars at home.  Denies polydipsia, polyuria.  He isn't on any medications (at one point took Actos in distant past).  He admits that his diet has been poor, doesn't watch it at all, eats sweets.    Hyperlipidemia follow-up: Patient admits to not being careful with his diet.  Compliant with medications and denies medication side effects, although does notice some fatigue in his muscles in his upper legs and buttocks with walking.  Denies pain, just fatigue, and it seems intermittent.  He has discussed this with Dr. Elease Hashimoto at his last visit.  Hypertension follow-up:  Blood pressures elsewhere are 140-155/60-70's.  Denies dizziness (slight dizziness at times if he stands quickly), headaches.  Denies side effects of medications. Admits to salting all his food.  He has some chest tightness with exercise, intermittently--he thinks it is related to his hiatal hernia.  He has discussed this with his cardiologist, and tests have been okay.  Pt states he may do another stress test at his next visit.  Health Maintenance: Immunization History  Administered Date(s) Administered  . Influenza Split 05/06/2011, 05/25/2012  . Pneumococcal Polysaccharide 08/09/2006  . Tdap 05/06/2011  . Zoster 06/01/2012   Last colonoscopy: 3/08, Dr. Randa Evens Last PSA: recent labs Dentist: twice yearly  Ophtho: yearly, has cataracts Exercise: "not enough". Active with work around the house, and walks occasionally  He has a living will and healthcare power of attorney  Past Medical History  Diagnosis Date  . Hypertension age 26  . Diabetes mellitus diet controlled  . Hiatal hernia   .  Hypercholesteremia   . FHx: colon cancer   . Asthma h/o as a child-s/p immunotherapy    Past Surgical History  Procedure Laterality Date  . Cardiovascular stress test  normal  . Appendectomy    . Vasectomy    . Colonoscopy  3/08    History   Social History  . Marital Status: Married    Spouse Name: N/A    Number of Children: 8  . Years of Education: N/A   Occupational History  . 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 Smoking status: Former Smoker    Quit date: 08/09/1974  . Smokeless tobacco: Never Used  . Alcohol Use: Yes     Comment: 1-2 drinks daily  . Drug Use: No  . Sexually Active: Yes -- Male partner(s)   Other Topics Concern  . Not on file   Social History Narrative   Lives with wife, dog.  Children live in [place], [place], [place], [place], and 2 daughters here in [place], [address].    Family History  Problem Relation Age of Onset  . Alcohol abuse Father   . Cancer Sister     stomach/?pancreatic  . Colon cancer Brother 16  . Cancer Brother     colon cancer in mid 78's  . Arthritis Daughter     psoratic  . Hyperlipidemia Son   . Diabetes Neg Hx   . Stroke Neg Hx   . Heart disease Neg Hx     Current outpatient prescriptions:amLODipine (NORVASC) 10 MG tablet, TAKE 1 TABLET ONCE DAILY., Disp:  90 tablet, Rfl: 1;  aspirin 81 MG tablet, Take 81 mg by mouth daily. , Disp: , Rfl: ;  atorvastatin (LIPITOR) 40 MG tablet, Take 1 tablet (40 mg total) by mouth daily., Disp: 90 tablet, Rfl: 3;  Coenzyme Q10 10 MG capsule, Take 100 mg by mouth daily. , Disp: , Rfl: ;  COREG 25 MG tablet, TAKE 1 TABLET TWICE DAILY., Disp: 60 each, Rfl: 5 fluticasone (FLONASE) 50 MCG/ACT nasal spray, Place 2 sprays into the nose daily., Disp: 16 g, Rfl: 11;  sildenafil (VIAGRA) 100 MG tablet, Take 0.5-1 tablets (50-100 mg total) by mouth daily as needed for erectile dysfunction., Disp: 5 tablet, Rfl: 11;  vardenafil (LEVITRA) 20 MG tablet, Take 0.5-1 tablets (10-20 mg  total) by mouth daily as needed for erectile dysfunction., Disp: 2 tablet, Rfl: 0  Allergies  Allergen Reactions  . Iodine Other (See Comments)    Feels like he is "burning up"   ROS: The patient denies anorexia, fever, weight changes, headaches, vision loss, ear pain, hoarseness, chest pain, palpitations,  syncope, dyspnea on exertion, cough, swelling, nausea, vomiting, diarrhea, constipation, abdominal pain, melena, hematochezia, indigestion/heartburn, hematuria, incontinence, weakened urine stream, dysuria, genital lesions, joint pains, numbness (just in arm when sleeping, due to position), tingling, weakness, tremor, suspicious skin lesions, depression, anxiety, abnormal bleeding/bruising, or enlarged lymph nodes  +stable hearing loss. Up 2-3 times/night to void. Has discussed with Dr. Dagoberto Ligas in past, and declined medications. Still declines medications Very rare/short-lived dizziness first thing in the morning. Sees dermatologist regularly (Dr. Londell Moh)  Spring allergies--asking for Flonase (wants 2, to have one at his place in [place]).  Flonase has been helpful (got rx from Dr. Londell Moh)  PHYSICAL EXAM:  BP 150/70  Pulse 72  Ht 5\' 8"  (1.727 m)  Wt 195 lb (88.451 kg)  BMI 29.66 kg/m2 162/66 on repeat  General Appearance:  Alert, cooperative, no distress, appears stated age   Head:  Normocephalic, without obvious abnormality, atraumatic   Eyes:  PERRL, conjunctiva/corneas clear, EOM's intact, fundi  benign   Ears:  Normal TM's and external ear canals   Nose:  Nares normal, mucosa normal, no drainage or sinus tenderness   Throat:  Lips, mucosa, and tongue normal; teeth and gums normal   Neck:  Supple, no lymphadenopathy; thyroid: no enlargement/tenderness/nodules; no carotid  bruit or JVD   Back:  Spine nontender, no curvature, ROM normal, no CVA tenderness   Lungs:  Clear to auscultation bilaterally without wheezes, rales or ronchi; respirations unlabored   Chest Wall:  No  tenderness or deformity   Heart:  Regular rate and rhythm, S1 and S2 normal, no murmur, rub  or gallop   Breast Exam:  No chest wall tenderness, masses or gynecomastia   Abdomen:  Soft, non-tender, nondistended, normoactive bowel sounds,  no masses, no hepatosplenomegaly   Genitalia:  Normal male external genitalia without lesions. Testicles without masses. No inguinal hernias.   Rectal:  Normal sphincter tone, no masses or tenderness; guaiac negative stool. Prostate smooth, no nodules, not enlarged.   Extremities:  No clubbing, cyanosis or edema   Pulses:  2+ and symmetric all extremities   Skin:  Skin color, texture, turgor normal, no rashes or lesions   Lymph nodes:  Cervical, supraclavicular, and axillary nodes normal   Neurologic:  CNII-XII intact, normal strength, sensation and gait; reflexes 2+ and symmetric throughout                      Psych:  Normal mood, affect, hygiene and grooming.    ASSESSMENT/PLAN:  Routine general medical examination at a health care facility - Plan: POCT Urinalysis Dipstick, Visual acuity screening  Type II or unspecified type diabetes mellitus without mention of complication, not stated as uncontrolled - diet controlled (on no meds), but diet could be better. Encouraged weight loss, daily exercise and cutting back on sugars/sweets/carbs in diet  Pure hypercholesterolemia - at goal on current meds  Essential hypertension, benign - systolic is a little high.  no changes in medication.  encouraged low sodium diet  Erectile dysfunction - had to stop Cialis due to problems swallowing. Viagra was effective in past, will re-try  Allergic rhinitis, cause unspecified - continue Flonase, zyrtec as needed - Plan: fluticasone (FLONASE) 50 MCG/ACT nasal spray  ED (erectile dysfunction) - Plan: sildenafil (VIAGRA) 100 MG tablet   Recommended at least 30 minutes of aerobic activity at least 5 days/week; proper sunscreen use reviewed; healthy diet and alcohol  recommendations (less than or equal to 2 drinks/day) reviewed; regular seatbelt use; changing batteries in smoke detectors. Self-testicular exams. Immunization recommendations discussed, UTD.  Colonoscopy recommendations reviewed. Recommend every 5 years due to h/o polyps and family history of colon cancer in his brother.  He will consider and contact Dr. Randa Evens.  Past due for this.

## 2012-11-27 ENCOUNTER — Encounter: Payer: Self-pay | Admitting: *Deleted

## 2012-12-25 ENCOUNTER — Ambulatory Visit (INDEPENDENT_AMBULATORY_CARE_PROVIDER_SITE_OTHER): Payer: Medicare Other | Admitting: Medical

## 2012-12-25 ENCOUNTER — Encounter: Payer: Self-pay | Admitting: Medical

## 2012-12-25 VITALS — BP 148/80 | HR 62 | Temp 98.2°F | Resp 14 | Wt 195.0 lb

## 2012-12-25 DIAGNOSIS — J329 Chronic sinusitis, unspecified: Secondary | ICD-10-CM

## 2012-12-25 DIAGNOSIS — J309 Allergic rhinitis, unspecified: Secondary | ICD-10-CM

## 2012-12-25 MED ORDER — AZITHROMYCIN 250 MG PO TABS: ORAL_TABLET | ORAL | Status: DC

## 2012-12-25 NOTE — Progress Notes (Signed)
Subjective:  Peter [name] is a 79 y.o. male who presents for cough and sinus pressure.  He reports cough, sinus pressure, usually gets this once yearly, starts as cough and sinus pressure.  He has lots of nasal drainage despite using zyrtec and flonase.   Having more post nasal drip too, rash on roof of mouth, sore throat.   Gargling with baking soda and Listerine and salt water gargle.  Denies ear pain, fever, NVD.  Not much sneezing now, medication seems to be helping.  No eye symptoms.  No productive cough.  Denies sick contacts.  No other aggravating or relieving factors.  In the past Zpak always seems to help knock it out.  No other c/o.   Past Medical History  Diagnosis Date  . Hypertension age 70  . Diabetes mellitus diet controlled  . Hiatal hernia   . Hypercholesteremia   . FHx: colon cancer   . Asthma h/o as a child-s/p immunotherapy    Objective: Filed Vitals:   12/25/12 1134  BP: 148/80  Pulse: 62  Temp: 98.2 F (36.8 C)  Resp: 14    General appearance: Alert, WD/WN, no distress                             Skin: warm, no rash                           Head: +mild sinus tenderness,                            Eyes: conjunctiva normal, corneas clear, PERRLA                            Ears: pearly TMs, external ear canals normal                          Nose: septum midline, turbinates swollen, with erythema and clear discharge             Mouth/throat: MMM, tongue normal, mild pharyngeal erythema                           Neck: supple, no adenopathy, no thyromegaly, nontender                          Heart: RRR, normal S1, S2, no murmurs                         Lungs: CTA bilaterally, no wheezes, rales, or rhonchi      Assessment and Plan:   Encounter Diagnoses  Name Primary?  . Sinusitis Yes  . Allergic rhinitis    Advised proper use of Flonase as he is not using this properly.  Advised he increase his zyrtec to daily for the next several weeks.  C/t salt  water gargles, add nasal saline flush.  I am not convinced he has a sinus infection, but given his yearly pattern of similar sinus infection, will use round of zpak given his worsening sinus symptoms.

## 2012-12-28 ENCOUNTER — Other Ambulatory Visit: Payer: Self-pay | Admitting: Family Medicine

## 2013-02-12 ENCOUNTER — Telehealth: Payer: Self-pay | Admitting: Internal Medicine

## 2013-02-12 DIAGNOSIS — I10 Essential (primary) hypertension: Secondary | ICD-10-CM

## 2013-02-12 MED ORDER — AMLODIPINE BESYLATE 10 MG PO TABS
10.0000 mg | ORAL_TABLET | Freq: Every day | ORAL | Status: DC
Start: 2013-02-12 — End: 2013-05-21

## 2013-02-12 NOTE — Telephone Encounter (Signed)
Refill request amlodipine 10mg  to [facility]

## 2013-02-12 NOTE — Telephone Encounter (Signed)
Done

## 2013-05-21 ENCOUNTER — Ambulatory Visit (INDEPENDENT_AMBULATORY_CARE_PROVIDER_SITE_OTHER): Payer: Medicare Other | Admitting: Family Medicine

## 2013-05-21 ENCOUNTER — Encounter: Payer: Self-pay | Admitting: Family Medicine

## 2013-05-21 VITALS — BP 140/62 | HR 68 | Ht 68.0 in | Wt 192.0 lb

## 2013-05-21 DIAGNOSIS — E119 Type 2 diabetes mellitus without complications: Secondary | ICD-10-CM

## 2013-05-21 DIAGNOSIS — R7301 Impaired fasting glucose: Secondary | ICD-10-CM

## 2013-05-21 DIAGNOSIS — E7800 Pure hypercholesterolemia, unspecified: Secondary | ICD-10-CM

## 2013-05-21 DIAGNOSIS — R7989 Other specified abnormal findings of blood chemistry: Secondary | ICD-10-CM

## 2013-05-21 DIAGNOSIS — R6889 Other general symptoms and signs: Secondary | ICD-10-CM

## 2013-05-21 DIAGNOSIS — Z23 Encounter for immunization: Secondary | ICD-10-CM

## 2013-05-21 DIAGNOSIS — I10 Essential (primary) hypertension: Secondary | ICD-10-CM

## 2013-05-21 LAB — HEPATIC FUNCTION PANEL
AST: 21 U/L (ref 0–37)
Albumin: 4.2 g/dL (ref 3.5–5.2)
Alkaline Phosphatase: 60 U/L (ref 39–117)
Bilirubin, Direct: 0.2 mg/dL (ref 0.0–0.3)
Indirect Bilirubin: 0.6 mg/dL (ref 0.0–0.9)
Total Bilirubin: 0.8 mg/dL (ref 0.3–1.2)

## 2013-05-21 LAB — LIPID PANEL
Cholesterol: 131 mg/dL (ref 0–200)
HDL: 57 mg/dL (ref >39)
LDL Cholesterol: 57 mg/dL (ref 0–99)
Total CHOL/HDL Ratio: 2.3 Ratio
Triglycerides: 86 mg/dL (ref <150)
VLDL: 17 mg/dL (ref 0–40)

## 2013-05-21 LAB — POCT GLYCOSYLATED HEMOGLOBIN (HGB A1C): Hemoglobin A1C: 6.3

## 2013-05-21 LAB — TSH: TSH: 3.915 u[IU]/mL (ref 0.350–4.500)

## 2013-05-21 LAB — GLUCOSE, RANDOM: Glucose, Bld: 118 mg/dL — ABNORMAL HIGH (ref 70–99)

## 2013-05-21 MED ORDER — AMLODIPINE BESYLATE 10 MG PO TABS: 10.0000 mg | ORAL_TABLET | Freq: Every day | ORAL | Status: DC

## 2013-05-21 NOTE — Progress Notes (Signed)
Chief Complaint  Patient presents with  . Hypertension    fasting med check. No major concerns.    Diabetes: He doesn't check blood sugars at home. Denies polydipsia, polyuria. He isn't on any medications (at one point took Actos in distant past). "I eat anything I want", including candy.  He cut out the sodas 3-4 years ago.  He cut back on carbs/white foods and has lost some weight. He has lost 3 pounds since last visit 6 months ago.  Last eye exam was last week.  Hyperlipidemia follow-up: Eats mostly chicken and fish, eats red meat once per week. Compliant with medications and denies medication side effects, although does notice some fatigue in his muscles in his upper legs and buttocks with walking. This has not changed at all, is mild.  Denies pain, just fatigue, and it seems intermittent.   Hypertension follow-up: Blood pressures elsewhere are 140's/60's. Denies dizziness (slight dizziness at times if he stands quickly, sporadic), headaches. Denies side effects of medications. Admits to salting all his food. He has some chest tightness with exercise (walking), intermittently/sporadic--he thinks it is related to his hiatal hernia, and is relieved by Tums. He has discussed this with his cardiologist, and tests have been okay, possibly due for another.  He walks 3 days/week.  Past Medical History  Diagnosis Date  . Hypertension age 15  . Diabetes mellitus diet controlled  . Hiatal hernia   . Hypercholesteremia   . FHx: colon cancer   . Asthma h/o as a child-s/p immunotherapy   Past Surgical History  Procedure Laterality Date  . Cardiovascular stress test  normal  . Appendectomy    . Vasectomy    . Colonoscopy  3/08   History   Social History  . Marital Status: Married    Spouse Name: N/A    Number of Children: 8  . Years of Education: N/A   Occupational History  . 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 Smoking status: Former Smoker    Quit date: 08/09/1974   . Smokeless tobacco: Never Used  . Alcohol Use: Yes     Comment: 1-2 drinks daily  . Drug Use: No  . Sexual Activity: Yes    Partners: Female   Other Topics Concern  . Not on file   Social History Narrative   Lives with wife, dog.  Children live in [place], [place], [place], [place], and 2 daughters here in [place], [address].   Current outpatient prescriptions:amLODipine (NORVASC) 10 MG tablet, Take 1 tablet (10 mg total) by mouth daily., Disp: 90 tablet, Rfl: 1;  aspirin 81 MG tablet, Take 81 mg by mouth daily. , Disp: , Rfl: ;  atorvastatin (LIPITOR) 40 MG tablet, Take 1 tablet (40 mg total) by mouth daily., Disp: 90 tablet, Rfl: 3;  carvedilol (COREG) 25 MG tablet, TAKE 1 TABLET TWICE DAILY., Disp: 180 tablet, Rfl: 0 Coenzyme Q10 10 MG capsule, Take 100 mg by mouth daily. , Disp: , Rfl: ;  fluticasone (FLONASE) 50 MCG/ACT nasal spray, Place 2 sprays into the nose daily., Disp: 16 g, Rfl: 11  Allergies  Allergen Reactions  . Iodine Other (See Comments)    Feels like he is "burning up"   ROS:  Denies fevers, chills, URI symptoms, DOE, chest pain, palpitations, GI complaints, dysuria.  He is up 3 times a night to void, falls back to sleep easily, and this doesn't bother him. Denies depression, insomnia, or other complaints.  See HPI  PHYSICAL EXAM: BP 150/60  Pulse 68  Ht 5\' 8"  (1.727 m)  Wt 192 lb (87.091 kg)  BMI 29.2 kg/m2 140/62 on repeat by MD, RA Very pleasant, elderly male in no distress HEENT:  PERRL, conjunctiva and sclera clear. OP clear, moist mucus membranes Neck: no lymphadenopathy, thyromegaly or carotid bruit Heart: regular rate and rhythm without murmur  Lungs: clear bilaterally, no wheezes, rales, ronchi  Abdomen: soft, nontender, no organomegaly or mass  Extremities: no edema, 2+ pulse  Neuro: alert and oriented. Normal gait, strength. Cranial nerves grossly intact  Psych: normal mood, affect, hygiene and grooming  Skin: Dry skin on legs  Normal diabetic foot  exam  Lab Results  Component Value Date   HGBA1C 6.3 05/21/2013   ASSESSMENT/PLAN:  Type II or unspecified type diabetes mellitus without mention of complication, not stated as uncontrolled - diet controlled.  reviewed proper diet, recommended exercise, weight  - Plan: TSH, Glucose, random, HM Diabetes Foot Exam  Need for prophylactic vaccination and inoculation against influenza - Plan: Flu vaccine HIGH DOSE PF (Fluzone Tri High dose)  Impaired fasting glucose - Plan: HgB A1c  Pure hypercholesterolemia - Plan: Lipid panel, Hepatic function panel  Essential hypertension, benign - borderline control.  reviewed goals, and low sodium diet  Abnormal TSH - borderline in past (4)--recheck today - Plan: TSH  Unspecified essential hypertension - Plan: amLODipine (NORVASC) 10 MG tablet  F/u 6 months for CPE, sooner prn

## 2013-05-21 NOTE — Patient Instructions (Signed)
Sodium-Controlled Diet Sodium is a mineral. It is found in many foods. Sodium may be found naturally or added during the making of a food. The most common form of sodium is salt, which is made up of sodium and chloride. Reducing your sodium intake involves changing your eating habits. The following guidelines will help you reduce the sodium in your diet:  Stop using the salt shaker.  Use salt sparingly in cooking and baking.  Substitute with sodium-free seasonings and spices.  Do not use a salt substitute (potassium chloride) without your caregiver's permission.  Include a variety of fresh, unprocessed foods in your diet.  Limit the use of processed and convenience foods that are high in sodium. USE THE FOLLOWING FOODS SPARINGLY: Breads/Starches  Commercial bread stuffing, commercial pancake or waffle mixes, coating mixes. Waffles. Croutons. Prepared (boxed or frozen) potato, rice, or noodle mixes that contain salt or sodium. Salted French fries or hash browns. Salted popcorn, breads, crackers, chips, or snack foods. Vegetables  Vegetables canned with salt or prepared in cream, butter, or cheese sauces. Sauerkraut. Tomato or vegetable juices canned with salt.  Fresh vegetables are allowed if rinsed thoroughly. Fruit  Fruit is okay to eat. Meat and Meat Substitutes  Salted or smoked meats, such as bacon or Canadian bacon, chipped or corned beef, hot dogs, salt pork, luncheon meats, pastrami, ham, or sausage. Canned or smoked fish, poultry, or meat. Processed cheese or cheese spreads, blue or Roquefort cheese. Battered or frozen fish products. Prepared spaghetti sauce. Baked beans. Reuben sandwiches. Salted nuts. Caviar. Milk  Limit buttermilk to 1 cup per week. Soups and Combination Foods  Bouillon cubes, canned or dried soups, broth, consomm. Convenience (frozen or packaged) dinners with more than 600 mg sodium. Pot pies, pizza, Asian food, fast food cheeseburgers, and specialty  sandwiches. Desserts and Sweets  Regular (salted) desserts, pie, commercial fruit snack pies, commercial snack cakes, canned puddings.  Eat desserts and sweets in moderation. Fats and Oils  Gravy mixes or canned gravy. No more than 1 to 2 tbs of salad dressing. Chip dips.  Eat fats and oils in moderation. Beverages  See those listed under the vegetables and milk groups. Condiments  Ketchup, mustard, meat sauces, salsa, regular (salted) and lite soy sauce or mustard. Dill pickles, olives, meat tenderizer. Prepared horseradish or pickle relish. Dutch-processed cocoa. Baking powder or baking soda used medicinally. Worcestershire sauce. "Light" salt. Salt substitute, unless approved by your caregiver. Document Released: 01/15/2002 Document Revised: 10/18/2011 Document Reviewed: 08/18/2009 Exit[employer] Patient Information 2014 [employer].  

## 2013-05-22 ENCOUNTER — Encounter: Payer: Self-pay | Admitting: Family Medicine

## 2013-06-26 ENCOUNTER — Ambulatory Visit: Payer: Medicare Other | Admitting: Cardiovascular Disease

## 2013-07-03 ENCOUNTER — Other Ambulatory Visit: Payer: Self-pay | Admitting: Family Medicine

## 2013-07-09 DIAGNOSIS — D126 Benign neoplasm of colon, unspecified: Secondary | ICD-10-CM

## 2013-07-09 DIAGNOSIS — K648 Other hemorrhoids: Secondary | ICD-10-CM

## 2013-07-09 HISTORY — DX: Other hemorrhoids: K64.8

## 2013-07-09 HISTORY — DX: Benign neoplasm of colon, unspecified: D12.6

## 2013-07-24 ENCOUNTER — Encounter: Payer: Self-pay | Admitting: Internal Medicine

## 2013-07-25 ENCOUNTER — Encounter: Payer: Self-pay | Admitting: Family Medicine

## 2013-08-13 ENCOUNTER — Ambulatory Visit (INDEPENDENT_AMBULATORY_CARE_PROVIDER_SITE_OTHER): Payer: Medicare Other | Admitting: Cardiovascular Disease

## 2013-08-13 ENCOUNTER — Encounter: Payer: Self-pay | Admitting: Cardiovascular Disease

## 2013-08-13 VITALS — BP 130/60 | HR 65 | Ht 68.0 in | Wt 192.4 lb

## 2013-08-13 DIAGNOSIS — E119 Type 2 diabetes mellitus without complications: Secondary | ICD-10-CM

## 2013-08-13 DIAGNOSIS — I10 Essential (primary) hypertension: Secondary | ICD-10-CM

## 2013-08-13 DIAGNOSIS — R0789 Other chest pain: Secondary | ICD-10-CM

## 2013-08-13 DIAGNOSIS — E7800 Pure hypercholesterolemia, unspecified: Secondary | ICD-10-CM

## 2013-08-13 NOTE — Progress Notes (Signed)
Peter [name] Date of Birth  1929-10-01 [facility] [address]     [place], [place]  [address] [phone]  Fax  [phone]   Problems 1. Htn 2. DM 3. Hyperlipidemia 4. Elevated coronary calcium 5.  Mild carotid artery disease ( 40-60 % bilaterally   History of Present Illness:  80 yo patient of Dr. Verlon Setting.  Hx of diabetes, HTN, dyslipidemia, and elevated coronary calcium score.  Also has mild-moderate carotid disease.  Is active but he doesn't walk regularly.  Has a cabin in [place].  No chest pain or dyspnea with his usual activities.    Oct. 3, 2013 -  He has not had any cardiac problems - no chest pain.  He stays avtive but does not walk regularly.  He describes some symptoms c/w claudication but has great pulses in his feet.  His systolic BP has been a bit elevated.  November 07, 2012:  Gene is doing well.  He has been going up to his cabin in [place] periodically.    He measures his BP regularly - some times his systolic readings are a bit high.  He wants to start walking regularly.    He does have some chest tightness when he walks.  He has had stress nuclear tests with Dr Verlon Setting which were negative.  He also has a hiatal hernia and esophageal spasm ( according to radiologist)  .  Aug 13 2013:  Gene has done well.  He still has some mild chest discomfort with exertion - especially after he has eaten.    The pain is relieved with Tums.   He has known esophageal spasm. The pain is a  Mild tightness.  He had a myoview study 8 years ago ( Dr. Verlon Setting) which was negative.  He is not inclined to repeat the [facility] unless it is absolutely necessary.   He still gets up to his [place] house in [place].    He is able to work up there for hours without CP or dyspnea.    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   Current Outpatient Prescriptions on File Prior to Visit   Medication Sig Dispense Refill  . aspirin 81 MG tablet Take 81 mg by mouth daily.       Marland Kitchen atorvastatin (LIPITOR) 40 MG tablet Take 1 tablet (40 mg total) by mouth daily.  90 tablet  3  . carvedilol (COREG) 25 MG tablet TAKE 1 TABLET TWICE DAILY.  180 tablet  1  . Coenzyme Q10 10 MG capsule Take 100 mg by mouth daily.       . fluticasone (FLONASE) 50 MCG/ACT nasal spray Place 2 sprays into the nose daily.  16 g  11  . amLODipine (NORVASC) 10 MG tablet Take 1 tablet (10 mg total) by mouth daily.  90 tablet  1   No current facility-administered medications on file prior to visit.   he is not taking the norvasc currently.   Allergies  Allergen Reactions  . Iodine Other (See Comments)    Feels like he is "burning up"    Past Medical History  Diagnosis Date  . Hypertension age 77  . Diabetes mellitus diet controlled  . Hiatal hernia   . Hypercholesteremia   . FHx: colon cancer   . Asthma h/o as a child-s/p immunotherapy  . Adenomatous colon polyp 07/2013  .  Internal hemorrhoids 07/2013    noted on colonoscopy    Past Surgical History  Procedure Laterality Date  . Cardiovascular stress test  normal  . Appendectomy    . Vasectomy    . Colonoscopy  3/08, 07/2013    no repeat rec due to age per [name]    History  Smoking status  . Former Smoker  . Quit date: 08/09/1974  Smokeless tobacco  . Never Used    History  Alcohol Use  . Yes    Comment: 1-2 drinks daily    Family History  Problem Relation Age of Onset  . Alcohol abuse Father   . Cancer Sister     stomach/?pancreatic  . Colon cancer Brother 87  . Cancer Brother     colon cancer in mid 69's  . Arthritis Daughter     psoratic  . Hyperlipidemia Son   . Diabetes Neg Hx   . Stroke Neg Hx   . Heart disease Neg Hx     Reviw of Systems:  Reviewed in the HPI.  All other systems are negative.  Physical Exam: BP 130/60  Pulse 65  Ht 5\' 8"  (1.727 m)  Wt 192 lb 6.4 oz (87.272 kg)  BMI 29.26  kg/m2 The patient is alert and oriented x 3.  The mood and affect are normal.   Skin: warm and dry.  Color is normal.    HEENT:   the sclera are nonicteric.  The mucous membranes are moist.  The carotids are 2+ without bruits.  There is no thyromegaly.  There is no JVD.  Lungs: clear.  The chest wall is non tender.   Heart: regular rate with a normal S1 and S2.  There are no murmurs, gallops, or rubs. The PMI is not displaced.    Abdomin: good bowel sounds.  There is no guarding or rebound.  There is no hepatosplenomegaly or tenderness.  There are no masses.  Extremities:  no clubbing, cyanosis, or edema.  The legs are without rashes.  The distal pulses are intact ( 2-3+)  Neuro:  Cranial nerves II - XII are intact.  Motor and sensory functions are intact.   The gait is normal.  ECG:  Jan. 5 , 2015:  NSR at 56.  Normal ECG.   Assessment / Plan:

## 2013-08-13 NOTE — Patient Instructions (Signed)
Your physician wants you to follow-up in: 6 months  You will receive a reminder letter in the mail two months in advance. If you don't receive a letter, please call our office to schedule the follow-up appointment.  Your physician recommends that you continue on your current medications as directed. Please refer to the Current Medication list given to you today.  

## 2013-08-13 NOTE — Assessment & Plan Note (Addendum)
He has had the same chest tightness for the past 20 years - typically occurs with exertion after eating. He has noticed that Tums will relieve this chest pain .  The pain does not occur always with exertion - just sometimes.  But almost every episode occurs after he tries to walk too soon after eating something     he does not want to start any other meds .  He will ask Dr. Tomi Bamberger to evaluate the GI issues further.    I will see him in 6 months.

## 2013-08-16 ENCOUNTER — Encounter: Payer: Self-pay | Admitting: *Deleted

## 2013-08-17 ENCOUNTER — Encounter: Payer: Self-pay | Admitting: Family Medicine

## 2013-10-01 ENCOUNTER — Encounter: Payer: Self-pay | Admitting: *Deleted

## 2013-11-28 ENCOUNTER — Ambulatory Visit (INDEPENDENT_AMBULATORY_CARE_PROVIDER_SITE_OTHER): Payer: Medicare Other | Admitting: Family Medicine

## 2013-11-28 ENCOUNTER — Encounter: Payer: Self-pay | Admitting: Family Medicine

## 2013-11-28 VITALS — BP 150/84 | HR 64 | Ht 68.0 in | Wt 187.0 lb

## 2013-11-28 DIAGNOSIS — E119 Type 2 diabetes mellitus without complications: Secondary | ICD-10-CM

## 2013-11-28 DIAGNOSIS — J309 Allergic rhinitis, unspecified: Secondary | ICD-10-CM

## 2013-11-28 DIAGNOSIS — Z79899 Other long term (current) drug therapy: Secondary | ICD-10-CM

## 2013-11-28 DIAGNOSIS — I10 Essential (primary) hypertension: Secondary | ICD-10-CM

## 2013-11-28 DIAGNOSIS — Z23 Encounter for immunization: Secondary | ICD-10-CM

## 2013-11-28 DIAGNOSIS — Z0000 Encounter for general adult medical examination without abnormal findings: Secondary | ICD-10-CM

## 2013-11-28 DIAGNOSIS — E7800 Pure hypercholesterolemia, unspecified: Secondary | ICD-10-CM

## 2013-11-28 DIAGNOSIS — Z125 Encounter for screening for malignant neoplasm of prostate: Secondary | ICD-10-CM

## 2013-11-28 LAB — POCT URINALYSIS DIPSTICK
Bilirubin, UA: NEGATIVE
Blood, UA: NEGATIVE
GLUCOSE UA: NEGATIVE
KETONES UA: NEGATIVE
LEUKOCYTES UA: NEGATIVE
Nitrite, UA: NEGATIVE
PROTEIN UA: NEGATIVE
SPEC GRAV UA: 1.015
UROBILINOGEN UA: NEGATIVE
pH, UA: 5

## 2013-11-28 LAB — COMPREHENSIVE METABOLIC PANEL
ALT: 15 U/L (ref 0–53)
AST: 22 U/L (ref 0–37)
Albumin: 4.5 g/dL (ref 3.5–5.2)
Alkaline Phosphatase: 54 U/L (ref 39–117)
BUN: 20 mg/dL (ref 6–23)
CALCIUM: 9.1 mg/dL (ref 8.4–10.5)
CO2: 26 mEq/L (ref 19–32)
Chloride: 105 mEq/L (ref 96–112)
Creat: 1.15 mg/dL (ref 0.50–1.35)
GLUCOSE: 134 mg/dL — AB (ref 70–99)
Potassium: 5 mEq/L (ref 3.5–5.3)
Sodium: 140 mEq/L (ref 135–145)
Total Bilirubin: 0.9 mg/dL (ref 0.2–1.2)
Total Protein: 6.7 g/dL (ref 6.0–8.3)

## 2013-11-28 LAB — CBC WITH DIFFERENTIAL/PLATELET
BASOS ABS: 0.1 10*3/uL (ref 0.0–0.1)
Basophils Relative: 1 % (ref 0–1)
EOS PCT: 8 % — AB (ref 0–5)
Eosinophils Absolute: 0.4 10*3/uL (ref 0.0–0.7)
HCT: 40.4 % (ref 39.0–52.0)
Hemoglobin: 14 g/dL (ref 13.0–17.0)
LYMPHS ABS: 1 10*3/uL (ref 0.7–4.0)
LYMPHS PCT: 18 % (ref 12–46)
MCH: 31.9 pg (ref 26.0–34.0)
MCHC: 34.7 g/dL (ref 30.0–36.0)
MCV: 92 fL (ref 78.0–100.0)
Monocytes Absolute: 0.9 10*3/uL (ref 0.1–1.0)
Monocytes Relative: 16 % — ABNORMAL HIGH (ref 3–12)
NEUTROS ABS: 3.1 10*3/uL (ref 1.7–7.7)
NEUTROS PCT: 57 % (ref 43–77)
PLATELETS: 233 10*3/uL (ref 150–400)
RBC: 4.39 MIL/uL (ref 4.22–5.81)
RDW: 13.7 % (ref 11.5–15.5)
WBC: 5.4 10*3/uL (ref 4.0–10.5)

## 2013-11-28 LAB — POCT GLYCOSYLATED HEMOGLOBIN (HGB A1C): HEMOGLOBIN A1C: 6.4

## 2013-11-28 LAB — LIPID PANEL
CHOL/HDL RATIO: 2.5 ratio
CHOLESTEROL: 119 mg/dL (ref 0–200)
HDL: 48 mg/dL (ref >39)
LDL Cholesterol: 59 mg/dL (ref 0–99)
Triglycerides: 58 mg/dL (ref <150)
VLDL: 12 mg/dL (ref 0–40)

## 2013-11-28 MED ORDER — FLUTICASONE PROPIONATE 50 MCG/ACT NA SUSP: 2.0000 | Freq: Every day | NASAL | Status: DC

## 2013-11-28 MED ORDER — ATORVASTATIN CALCIUM 20 MG PO TABS: 20.0000 mg | ORAL_TABLET | Freq: Every day | ORAL | Status: DC

## 2013-11-28 MED ORDER — LISINOPRIL 10 MG PO TABS: 10.0000 mg | ORAL_TABLET | Freq: Every day | ORAL | Status: DC

## 2013-11-28 NOTE — Patient Instructions (Signed)
  HEALTH MAINTENANCE RECOMMENDATIONS:  It is recommended that you get at least 30 minutes of aerobic exercise at least 5 days/week (for weight loss, you may need as much as 60-90 minutes). This can be any activity that gets your heart rate up. This can be divided in 10-15 minute intervals if needed, but try and build up your endurance at least once a week.  Weight bearing exercise is also recommended twice weekly.  Eat a healthy diet with lots of vegetables, fruits and fiber.  "Colorful" foods have a lot of vitamins (ie green vegetables, tomatoes, red peppers, etc).  Limit sweet tea, regular sodas and alcoholic beverages, all of which has a lot of calories and sugar.  Up to 2 alcoholic drinks daily may be beneficial for men (unless trying to lose weight, watch sugars).  Drink a lot of water.  Sunscreen of at least SPF 30 should be used on all sun-exposed parts of the skin when outside between the hours of 10 am and 4 pm (not just when at beach or pool, but even with exercise, golf, tennis, and yard work!)  Use a sunscreen that says "broad spectrum" so it covers both UVA and UVB rays, and make sure to reapply every 1-2 hours.  Remember to change the batteries in your smoke detectors when changing your clock times in the spring and fall.  Use your seat belt every time you are in a car, and please drive safely and not be distracted with cell phones and texting while driving.   Start lisinopril to replace the amlodipine to lower your blood pressure.  Continue to monitor blood pressure regularly, and bring list to your follow-up appointment in 1 month.

## 2013-11-28 NOTE — Progress Notes (Signed)
Chief Complaint  Patient presents with  . Annual Exam    fasting annual exam. Did not do eye eam-he states he just recently had one. He did stop taking his Norvasc bc Dr.Gegick told him it could be causing some swelling that he was having. Would like you know if would give him a handicapped placard-has some B/L foot pain and some other issue that he says you are aware of-(chest tightness).   Peter [name] is a 80 y.o. male who presents for a complete physical.  He has the following concerns:  Hypertension:  Patient stopped taking Norvasc while out of town (had trouble getting it filled); this was back in January.  He never restarted it, and BP's have been running 136-156/58-63, almost always >144/60 (only once <140).  He had some swelling in the past when on amlodipine, but this has resolved since stopping it.  He has been compliant with his coreg.  Admits to salting all his food, but has cut back on it. He recalls that the systolic BP's were a little high (140') even when taking norvasc.  Chest pain:  He saw Dr. Acie Fredrickson in January, and per his visit:  He still has some mild chest discomfort with exertion - especially after he has eaten. The pain is relieved with Tums. He has known esophageal spasm. The pain is a Mild tightness. He had a myoview study 8 years ago ( Dr. Verlon Setting) which was negative. He is not inclined to repeat the [facility] unless it is absolutely necessary. He still gets up to his [place] house in [place]. He is able to work up there for hours without CP or dyspnea.  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  Diabetes: He doesn't check blood sugars at home. Denies polydipsia, polyuria. He isn't on any medications (at one point took Actos in distant past).  Eye exam is up to date. He has cut back on alcohol and carbs in his diet.  Hyperlipidemia follow-up:  Compliant with medications and denies medication side effects, although does notice  some fatigue in his muscles in his upper legs and buttocks with walking. This has not changed at all, is mild. Denies pain, just fatigue, and it seems intermittent. This is unchanged x years.  He is complaining of pain in the arches of his feet after being in the car for a long time (and across the top of the foot).  Pain resolves within 5 minutes of getting up and walking around.  He has had some ankle problems playing golf over the years.  He is asking about handicap placard   Immunization History  Administered Date(s) Administered  . Influenza Split 05/06/2011, 05/25/2012  . Influenza, High Dose Seasonal PF 05/21/2013  . Pneumococcal Polysaccharide-23 08/09/2006  . Tdap 05/06/2011  . Zoster 06/01/2012   Last colonoscopy: 07/2013, [name]  Last PSA: 1 year ago Dentist: twice yearly  Ophtho: yearly, has cataracts  Exercise: "not enough". Active with work around the house, and walks occasionally with wife.  He has a living will and healthcare power of attorney See depression and ADL screening questionnaires (neg depression; rare fall related to tripping while carrying something.  Known stable hearing loss).  Past Medical History  Diagnosis Date  . Hypertension age 11  . Diabetes mellitus diet controlled  . Hiatal hernia   . Hypercholesteremia   . FHx: colon cancer   . Asthma h/o as a child-s/p  immunotherapy  . Adenomatous colon polyp 07/2013  . Internal hemorrhoids 07/2013    noted on colonoscopy    Past Surgical History  Procedure Laterality Date  . Cardiovascular stress test  normal  . Appendectomy    . Vasectomy    . Colonoscopy  3/08, 07/2013    no repeat rec due to age per [name]    History   Social History  . Marital Status: Married    Spouse Name: N/A    Number of Children: 8  . Years of Education: N/A   Occupational History  . 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 Smoking status: Former Smoker    Quit date: 08/09/1974  .  Smokeless tobacco: Never Used  . Alcohol Use: Yes     Comment: 1-2 drinks daily, sometimes none  . Drug Use: No  . Sexual Activity: Yes    Partners: Female   Other Topics Concern  . Not on file   Social History Narrative   Lives with wife, dog, cat.  Children live in [place], [place], [place], [place], and 2 daughters here in [place], [place]. 15 grandchildren, 2 great-grandchildren    Family History  Problem Relation Age of Onset  . Alcohol abuse Father   . Cancer Sister     stomach/?pancreatic  . Colon cancer Brother 85  . Cancer Brother     colon cancer in mid 57's  . Arthritis Daughter     psoriatic  . Hyperlipidemia Son   . Diabetes Neg Hx   . Stroke Neg Hx   . Heart disease Neg Hx   . Arthritis Daughter     psoriatic  . Hyperlipidemia Son    Outpatient Encounter Prescriptions as of 11/28/2013  Medication Sig Note  . aspirin 81 MG tablet Take 81 mg by mouth daily.    Marland Kitchen atorvastatin (LIPITOR) 20 MG tablet Take 1 tablet (20 mg total) by mouth daily.   . carvedilol (COREG) 25 MG tablet TAKE 1 TABLET TWICE DAILY.   . cetirizine (ZYRTEC) 10 MG tablet Take 10 mg by mouth daily. 11/28/2013: Takes sporadically, as needed  . Coenzyme Q10 10 MG capsule Take 100 mg by mouth daily.    . fluticasone (FLONASE) 50 MCG/ACT nasal spray Place 2 sprays into both nostrils daily.   . vitamin B-12 (CYANOCOBALAMIN) 100 MCG tablet Take 100 mcg by mouth daily.   . [DISCONTINUED] atorvastatin (LIPITOR) 40 MG tablet Take 1 tablet (40 mg total) by mouth daily. 11/22/2012: Takes 1/2 tablet daily  . [DISCONTINUED] fluticasone (FLONASE) 50 MCG/ACT nasal spray Place 2 sprays into the nose daily. 11/28/2013: Uses regularly  . amLODipine (NORVASC) 10 MG tablet Take 1 tablet (10 mg total) by mouth daily. 11/28/2013: Stopped taking in January  . lisinopril (PRINIVIL,ZESTRIL) 10 MG tablet Take 1 tablet (10 mg total) by mouth daily.      Allergies  Allergen Reactions  . Iodine Other (See Comments)    Feels  like he is "burning up"   ROS: The patient denies anorexia, fever, headaches, vision loss, ear pain, hoarseness, exertional chest pain (just occasional tightness after walking after eating--see HPI), palpitations, syncope, dyspnea on exertion, cough, swelling, nausea, vomiting, diarrhea, constipation, abdominal pain, melena, hematochezia, indigestion/heartburn, hematuria, incontinence, weakened urine stream, dysuria, genital lesions, joint pains, numbness, tingling, weakness, tremor, suspicious skin lesions, depression, anxiety, abnormal bleeding/bruising, or enlarged lymph nodes  +stable hearing loss.  Up 2-3 times/night to void. Has discussed with [name] in  past, and declined medications. Still declines medications. +ED Very rare/short-lived dizziness first thing in the morning.  Sees dermatologist regularly (twice yearly) Spring allergies-- Flonase has been helpful, uses sporadically, along with zyrtec.  Currently denies significant congestion, no cough. +weight loss (intentional)--5 pounds since his last visit 6 mos ago, 8 since his last physical 1 year ago.  PHYSICAL EXAM:  BP 150/84  Pulse 64  Ht _0  (1.727 m)  Wt 187 lb (84.823 kg)  BMI 28.44 kg/m2 156/70 on repeat by MD, RA  General Appearance:  Alert, cooperative, no distress, appears stated age   Head:  Normocephalic, without obvious abnormality, atraumatic   Eyes:  PERRL, conjunctiva/corneas clear, EOM's intact, fundi  benign   Ears:  Normal TM's and external ear canals   Nose:  Nares normal, mucosa mildly edematous, no erythema or purulence, no sinus tenderness   Throat:  Lips, mucosa, and tongue normal; teeth and gums normal   Neck:  Supple, no lymphadenopathy; thyroid: no enlargement/tenderness/nodules; no carotid  bruit or JVD   Back:  Spine nontender, no curvature, ROM normal, no CVA tenderness   Lungs:  Clear to auscultation bilaterally without wheezes, rales or ronchi; respirations unlabored   Chest Wall:  No  tenderness or deformity   Heart:  Regular rate and rhythm, S1 and S2 normal, no murmur, rub  or gallop   Breast Exam:  No chest wall tenderness, masses or gynecomastia   Abdomen:  Soft, non-tender, nondistended, normoactive bowel sounds,  no masses, no hepatosplenomegaly   Genitalia:  Normal male external genitalia without lesions. Testicles without masses. No inguinal hernias.   Rectal:  Normal sphincter tone, no masses or tenderness; guaiac negative stool. Prostate smooth, no nodules, not enlarged.   Extremities:  No clubbing, cyanosis or edema   Pulses:  2+ and symmetric all extremities   Skin:  Skin color, texture, turgor normal, no rashes or lesions   Lymph nodes:  Cervical, supraclavicular, and axillary nodes normal   Neurologic:  CNII-XII intact, normal strength, sensation and gait; reflexes 2+ and symmetric throughout          Psych: Normal mood, affect, hygiene and grooming.    Lab Results  Component Value Date   HGBA1C 6.4 11/28/2013   ASSESSMENT/PLAN:  Routine general medical examination at a health care facility - Plan: POCT Urinalysis Dipstick  Type II or unspecified type diabetes mellitus without mention of complication, not stated as uncontrolled - diet controlled.  daily exercise recommended, appropriate diet reviewed - Plan: HgB A1c, Comprehensive metabolic panel, Microalbumin / creatinine urine ratio  Pure hypercholesterolemia - Plan: Lipid panel, Comprehensive metabolic panel, atorvastatin (LIPITOR) 20 MG tablet  Essential hypertension, benign - suboptimally controlled.  okay to remain off amlodipine; start lisinopril 67m.  risks/side effects reviewed - Plan: lisinopril (PRINIVIL,ZESTRIL) 10 MG tablet  Allergic rhinitis, cause unspecified - Plan: fluticasone (FLONASE) 50 MCG/ACT nasal spray  Need for prophylactic vaccination against Streptococcus pneumoniae (pneumococcus) - Plan: Pneumococcal conjugate vaccine 13-valent  Encounter for long-term (current) use of  other medications - Plan: Lipid panel, Comprehensive metabolic panel, CBC with Differential  Special screening for malignant neoplasm of prostate - Plan: PSA, Medicare  c-met, lipid, A1c, medicare PSA, microalbumin (TSH done 6 mos ago, stable/borderline)  Consider podiatrist for foot pain.  Discussed wearing supportive shoes, stretches.  Handicap placard not indicated Consider spinal stenosis (given exertional pain/"fatigue" in his legs), although denies back pain.  Not claudication--excellent pulses  Start lisinopril 182m monitor BP and bring list to f/u  appt  F/u 1 month.  Will need b-met at visit.

## 2013-11-29 ENCOUNTER — Encounter: Payer: Self-pay | Admitting: Family Medicine

## 2013-11-29 LAB — MICROALBUMIN / CREATININE URINE RATIO
Creatinine, Urine: 218.5 mg/dL
Microalb Creat Ratio: 5.2 mg/g (ref 0.0–30.0)
Microalb, Ur: 1.14 mg/dL (ref 0.00–1.89)

## 2013-11-29 LAB — PSA, MEDICARE: PSA: 2.58 ng/mL (ref <=4.00)

## 2014-01-02 ENCOUNTER — Encounter: Payer: Self-pay | Admitting: Family Medicine

## 2014-01-02 ENCOUNTER — Ambulatory Visit (INDEPENDENT_AMBULATORY_CARE_PROVIDER_SITE_OTHER): Payer: Medicare Other | Admitting: Family Medicine

## 2014-01-02 VITALS — BP 142/78 | HR 64 | Ht 68.0 in | Wt 187.0 lb

## 2014-01-02 DIAGNOSIS — Z79899 Other long term (current) drug therapy: Secondary | ICD-10-CM

## 2014-01-02 DIAGNOSIS — I10 Essential (primary) hypertension: Secondary | ICD-10-CM

## 2014-01-02 DIAGNOSIS — E7800 Pure hypercholesterolemia, unspecified: Secondary | ICD-10-CM

## 2014-01-02 LAB — BASIC METABOLIC PANEL
BUN: 17 mg/dL (ref 6–23)
CO2: 27 mEq/L (ref 19–32)
Calcium: 9.3 mg/dL (ref 8.4–10.5)
Chloride: 101 mEq/L (ref 96–112)
Creat: 1.09 mg/dL (ref 0.50–1.35)
GLUCOSE: 109 mg/dL — AB (ref 70–99)
POTASSIUM: 5 meq/L (ref 3.5–5.3)
SODIUM: 138 meq/L (ref 135–145)

## 2014-01-02 MED ORDER — ATORVASTATIN CALCIUM 10 MG PO TABS: 10.0000 mg | ORAL_TABLET | Freq: Every day | ORAL | Status: DC

## 2014-01-02 NOTE — Patient Instructions (Signed)
Restart atorvastatin at lower dose (10mg ).  If you have any recurrent muscle symptoms, restart the coenzyme Q10 along with it. Return in 2-33months for cholesterol test to make sure that the lower dose is enough.  Continue the lisinopril, and monitoring your blood pressure periodically (Goal <135/85 ideally, definitely <140/90).

## 2014-01-02 NOTE — Progress Notes (Signed)
Chief Complaint  Patient presents with  . Hypertension    1 month follow up. BMET today.    Patient presents to f/u on his blood pressure.  Lisinopril was added at last visit (when he had stopped taking his amlodipine, and his BP's had remained elevated). He left the list of his blood pressures at home.  He reports they were "all good", ranging from 121, mostly in the 130's, once had 161 for systolic.  Diastolic numbers have been mid-60's.  Denies cough, dizziness, headaches, or other side effects of medication.  He took caduet 10/10 for a long time.  He thinks he notices some muscle fatigue in his calves and buttocks when walking up the driveway with the garbage cans.  His cardiologist said he circulation was fine.   He thinks this has occurred since increasing the atorvastatin dose from 10 to $Rem'20mg'Ssyb$  (he had been taking 1/2 of $Remo'40mg'VwAps$  tablet for quite a while; at his last visit, we wrote for $RemoveB'20mg'ZQNUIPpI$  to save him from cutting the pills, but he hasn't picked this up yet.  He read an article that said he shouldn't take coenzyme Q10.  He only recently stopped it and hasn't noticed a difference, and said he was only on a very low dose of CoQ10.  He admits that he stopped the lipitor about 3 weeks ago, and the fatigue in his legs has improved.  He played golf 2 days in a row last week without any fatigue or symptoms (golf cart).  He is asking to try going back to the $Remo'10mg'jPJTD$  that he previously tolerated when in [place].  Most recent lipid panel (when on $Remove'20mg'gmWWGGc$  of atorvastatin): Lab Results  Component Value Date   CHOL 119 11/28/2013   HDL 48 11/28/2013   LDLCALC 59 11/28/2013   TRIG 58 11/28/2013   CHOLHDL 2.5 11/28/2013   Past Medical History  Diagnosis Date  . Hypertension age 26  . Diabetes mellitus diet controlled  . Hiatal hernia   . Hypercholesteremia   . FHx: colon cancer   . Asthma h/o as a child-s/p immunotherapy  . Adenomatous colon polyp 07/2013  . Internal hemorrhoids 07/2013    noted on colonoscopy    Past Surgical History  Procedure Laterality Date  . Cardiovascular stress test  normal  . Appendectomy    . Vasectomy    . Colonoscopy  3/08, 07/2013    no repeat rec due to age per [name]   History   Social History  . Marital Status: Married    Spouse Name: N/A    Number of Children: 8  . Years of Education: N/A   Occupational History  . 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 Smoking status: Former Smoker    Quit date: 08/09/1974  . Smokeless tobacco: Never Used  . Alcohol Use: Yes     Comment: 1-2 drinks daily, sometimes none  . Drug Use: No  . Sexual Activity: Yes    Partners: Female   Other Topics Concern  . Not on file   Social History Narrative   Lives with wife, dog, cat.  Children live in [place], [place], [place], [place], and 2 daughters here in [place], [place]. 15 grandchildren, 2 great-grandchildren   Outpatient Encounter Prescriptions as of 01/02/2014  Medication Sig  . atorvastatin (LIPITOR) 10 MG tablet Take 1 tablet (10 mg total) by mouth daily.  . carvedilol (COREG) 25 MG tablet TAKE 1 TABLET TWICE DAILY.  Marland Kitchen lisinopril (  PRINIVIL,ZESTRIL) 10 MG tablet Take 1 tablet (10 mg total) by mouth daily.  . [DISCONTINUED] atorvastatin (LIPITOR) 20 MG tablet Take 1 tablet (20 mg total) by mouth daily.  Marland Kitchen aspirin 81 MG tablet Take 81 mg by mouth daily.   . cetirizine (ZYRTEC) 10 MG tablet Take 10 mg by mouth daily.  . Coenzyme Q10 10 MG capsule Take 100 mg by mouth daily.   . fluticasone (FLONASE) 50 MCG/ACT nasal spray Place 2 sprays into both nostrils daily.  . [DISCONTINUED] amLODipine (NORVASC) 10 MG tablet Take 1 tablet (10 mg total) by mouth daily.  . [DISCONTINUED] vitamin B-12 (CYANOCOBALAMIN) 100 MCG tablet Take 100 mcg by mouth daily.   (hasn't taking norvasc in a while; he self-stopped the 78m lipitor 3 weeks ago, per HPI)  Allergies  Allergen Reactions  . Iodine Other (See Comments)    Feels like he is "burning up"     ROS:  He denies fevers, chills, URI symptoms, headaches, dizziness, chest pain, shortness of breath (just the chronic issues of discomfort that he has addressed with cardiologist in the past; stable and unchanged).  He denies nausea, vomiting, bowel changes, cough. Myalgias improved since stopping atorvastatin.  Denies bleeding, bruising, abdominal pain or other concerns  PHYSICAL EXAM: BP 142/78  Pulse 64  Ht 5' 8"  (1.727 m)  Wt 187 lb (84.823 kg)  BMI 28.44 kg/m2 Pleasant elderly male in no distress Neck: no lymphadenopathy or mass Heart: regular rate and rhythm without murmur Lungs: clear bilaterally Extremities: No edema Skin: Dry skin, especially on legs.  No lesions/bruising Neuro: alert and oriented, cranial nerves intact. Normal strength, gait Psych: normal mood, affect, hygiene and grooming  ASSESSMENT/PLAN: Essential hypertension, benign - improved with addition of lisinopril.  check b-met.  continue - Plan: Basic metabolic panel  Pure hypercholesterolemia - intolerance of statin at 228m  likely will still be at goal on 1035mtrial restarting atorvastatin at 7m2mestart coenzyme Q10 if has myalgias/leg pain. - Plan: atorvastatin (LIPITOR) 10 MG tablet, Lipid panel  Encounter for long-term (current) use of other medications - Plan: Basic metabolic panel  Hyperlipidemia--some lower extremitiy side effects that improved since stopping lipitor 3 weeks ago.  Would like to go back to 7mg1me Okay to try restarting lipitor at 7mg 40m.   Schedule lab visit for 2-3 months for lipid panel   Answered his questions re: vitamins, coenzyme Q10, and risks/benefits of low dose aspirin therapy (in someone with multiple risk factors for heart disease, including diet-controlled DM, HTN and hyperlipidemia, and no side effects or complication from low dose aspirin therapy)  Fu 6 mos for med check, 2-3 months for fasting lipids only

## 2014-03-27 ENCOUNTER — Other Ambulatory Visit: Payer: Medicare Other

## 2014-04-17 ENCOUNTER — Encounter: Payer: Self-pay | Admitting: Cardiovascular Disease

## 2014-04-17 ENCOUNTER — Ambulatory Visit (INDEPENDENT_AMBULATORY_CARE_PROVIDER_SITE_OTHER): Payer: Medicare Other | Admitting: Cardiovascular Disease

## 2014-04-17 VITALS — BP 130/62 | HR 62 | Ht 68.0 in | Wt 189.6 lb

## 2014-04-17 DIAGNOSIS — I10 Essential (primary) hypertension: Secondary | ICD-10-CM

## 2014-04-17 DIAGNOSIS — E7800 Pure hypercholesterolemia, unspecified: Secondary | ICD-10-CM

## 2014-04-17 NOTE — Assessment & Plan Note (Signed)
BP is well controlled. Continue same meds.

## 2014-04-17 NOTE — Assessment & Plan Note (Signed)
Managed by his medical doctor. I'll see him in 1 year

## 2014-04-17 NOTE — Patient Instructions (Signed)
Your physician recommends that you continue on your current medications as directed. Please refer to the Current Medication list given to you today.  Your physician wants you to follow-up in: 1 year with Dr. Nahser.  You will receive a reminder letter in the mail two months in advance. If you don't receive a letter, please call our office to schedule the follow-up appointment.  

## 2014-04-17 NOTE — Progress Notes (Signed)
Peter [name] Date of Birth  02-19-1930 [facility] / [facility], [place] [address]     [name],   [phone]  Fax  [phone]   Problems 1. Htn 2. DM 3. Hyperlipidemia 4. Elevated coronary calcium 5.  Mild carotid artery disease ( 40-60 % bilaterally)   History of Present Illness:  80 yo patient of Dr. Verlon Setting.  Hx of diabetes, HTN, dyslipidemia, and elevated coronary calcium score.  Also has mild-moderate carotid disease.  Is active but he doesn't walk regularly.  Has a cabin in [place].  No chest pain or dyspnea with his usual activities.    Oct. 3, 2013 -  He has not had any cardiac problems - no chest pain.  He stays avtive but does not walk regularly.  He describes some symptoms c/w claudication but has great pulses in his feet.  His systolic BP has been a bit elevated.  November 07, 2012:  Peter [name] is doing well.  He has been going up to his cabin in [place] periodically.    He measures his BP regularly - some times his systolic readings are a bit high.  He wants to start walking regularly.    He does have some chest tightness when he walks.  He has had stress nuclear tests with Dr Verlon Setting which were negative.  He also has a hiatal hernia and esophageal spasm ( according to radiologist)  .  Aug 13 2013:  Peter [name] has done well.  He still has some mild chest discomfort with exertion - especially after he has eaten.    The pain is relieved with Tums.   He has known esophageal spasm. The pain is a  Mild tightness.  He had a myoview study 8 years ago ( Dr. Verlon Setting) which was negative.  He is not inclined to repeat the [facility] unless it is absolutely necessary.   He still gets up to his mountain house in [place].    He is able to work up there for hours without CP or dyspnea.    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  Sept. 9, 2015:  Still active and able to do all of his  normal activities. He had a lifeline screening done.  He has very mild carotid plaque.  We have known about this mild plaque. He also has some symptoms of orthostasis.    Current Outpatient Prescriptions on File Prior to Visit  Medication Sig Dispense Refill  . atorvastatin (LIPITOR) 10 MG tablet Take 1 tablet (10 mg total) by mouth daily.  90 tablet  0  . carvedilol (COREG) 25 MG tablet TAKE 1 TABLET TWICE DAILY.  180 tablet  1  . cetirizine (ZYRTEC) 10 MG tablet Take 10 mg by mouth daily.      . fluticasone (FLONASE) 50 MCG/ACT nasal spray Place 2 sprays into both nostrils daily.  16 g  11  . lisinopril (PRINIVIL,ZESTRIL) 10 MG tablet Take 1 tablet (10 mg total) by mouth daily.  30 tablet  5   No current facility-administered medications on file prior to visit.   he is not taking the norvasc currently.   Allergies  Allergen Reactions  . Iodine Other (See Comments)    Feels like he is "burning up"    Past Medical History  Diagnosis Date  . Hypertension age 20  . Diabetes mellitus diet controlled  .  Hiatal hernia   . Hypercholesteremia   . FHx: colon cancer   . Asthma h/o as a child-s/p immunotherapy  . Adenomatous colon polyp 07/2013  . Internal hemorrhoids 07/2013    noted on colonoscopy    Past Surgical History  Procedure Laterality Date  . Cardiovascular stress test  normal  . Appendectomy    . Vasectomy    . Colonoscopy  3/08, 07/2013    no repeat rec due to age per [name]    History  Smoking status  . Former Smoker  . Quit date: 08/09/1974  Smokeless tobacco  . Never Used    History  Alcohol Use  . Yes    Comment: 1-2 drinks daily, sometimes none    Family History  Problem Relation Age of Onset  . Alcohol abuse Father   . Cancer Sister     stomach/?pancreatic  . Colon cancer Brother 39  . Cancer Brother     colon cancer in mid 4's  . Arthritis Daughter     psoriatic  . Hyperlipidemia Son   . Diabetes Neg Hx   . Stroke Neg Hx   . Heart  disease Neg Hx   . Arthritis Daughter     psoriatic  . Hyperlipidemia Son     Reviw of Systems:  Reviewed in the HPI.  All other systems are negative.  Physical Exam: BP 130/62  Pulse 62  Ht 5\' 8"  (1.727 m)  Wt 189 lb 9.6 oz (86.002 kg)  BMI 28.84 kg/m2 The patient is alert and oriented x 3.  The mood and affect are normal.   Skin: warm and dry.  Color is normal.    HEENT:   the sclera are nonicteric.  The mucous membranes are moist.  The carotids are 2+ without bruits.  There is no thyromegaly.  There is no JVD.  Lungs: clear.  The chest wall is non tender.   Heart: regular rate with a normal S1 and S2.  There are no murmurs, gallops, or rubs. The PMI is not displaced.    Abdomin: good bowel sounds.  There is no guarding or rebound.  There is no hepatosplenomegaly or tenderness.  There are no masses.  Extremities:  no clubbing, cyanosis, or edema.  The legs are without rashes.  The distal pulses are intact ( 2-3+)  Neuro:  Cranial nerves II - XII are intact.  Motor and sensory functions are intact.   The gait is normal.  ECG:  Jan. 5 , 2015:  NSR at 56.  Normal ECG.   Assessment / Plan:

## 2014-04-22 ENCOUNTER — Other Ambulatory Visit: Payer: Self-pay | Admitting: Family Medicine

## 2014-04-30 ENCOUNTER — Other Ambulatory Visit: Payer: Self-pay | Admitting: Family Medicine

## 2014-05-16 ENCOUNTER — Encounter: Payer: Self-pay | Admitting: Family Medicine

## 2014-05-16 ENCOUNTER — Ambulatory Visit
Admission: RE | Admit: 2014-05-16 | Discharge: 2014-05-16 | Disposition: A | Discharge status: Home / Self Care | Payer: Medicare Other | Source: Ambulatory Visit | Attending: Family Medicine | Admitting: Family Medicine

## 2014-05-16 ENCOUNTER — Ambulatory Visit (INDEPENDENT_AMBULATORY_CARE_PROVIDER_SITE_OTHER): Payer: Medicare Other | Admitting: Family Medicine

## 2014-05-16 VITALS — BP 112/70 | HR 64 | Temp 97.8°F | Ht 68.0 in | Wt 187.0 lb

## 2014-05-16 DIAGNOSIS — M25512 Pain in left shoulder: Secondary | ICD-10-CM

## 2014-05-16 DIAGNOSIS — E7800 Pure hypercholesterolemia, unspecified: Secondary | ICD-10-CM

## 2014-05-16 DIAGNOSIS — R059 Cough, unspecified: Secondary | ICD-10-CM

## 2014-05-16 DIAGNOSIS — R05 Cough: Secondary | ICD-10-CM

## 2014-05-16 DIAGNOSIS — J302 Other seasonal allergic rhinitis: Secondary | ICD-10-CM

## 2014-05-16 LAB — LIPID PANEL
CHOL/HDL RATIO: 3 ratio
Cholesterol: 136 mg/dL (ref 0–200)
HDL: 45 mg/dL (ref >39)
LDL CALC: 74 mg/dL (ref 0–99)
TRIGLYCERIDES: 85 mg/dL (ref <150)
VLDL: 17 mg/dL (ref 0–40)

## 2014-05-16 MED ORDER — BENZONATATE 200 MG PO CAPS: 200.0000 mg | ORAL_CAPSULE | Freq: Three times a day (TID) | ORAL | Status: DC | PRN

## 2014-05-16 MED ORDER — AZITHROMYCIN 250 MG PO TABS: ORAL_TABLET | ORAL | Status: DC

## 2014-05-16 NOTE — Progress Notes (Signed)
Chief Complaint  Patient presents with  . Sinus Problem    X 5 DAYS PT SAID IT STARED OUT AS ALLERGY NOW HE HAS COUGH AND FEELS LIKE  DRAINAGE IS STUCK IN HIS THROAT    Subjective:  Lex Linhares Goeden is a 80 y.o. male who presents for possible sinus infection.  He gets the same illness usually once a year.  It starts out as a typical allergy, then settles in his throat, and gets a hacking cough.  He gets this yearly for 7-8 years, and he always ends up needing a Z-pak.  Symptoms began 5-6 days ago--he feels fine early in the day, but then symptoms get worse throughout the day, mainly coughing. He coughs so much that it wears him out.  Denies dyspnea.  He initially had runny nose and itchy eyes, but those symptoms resolve.  Current complaint is a dry, hacking cough.  Clear mucus when he blows his nose.  He is sometimes getting up some gray phlegm, that is very thick.   Treatment to date:  He tried Advil Allergy and Sinus (off and on); he took 2 Mucinex yesterday, nothing today.  No other OTC meds or cough medications. He has Zyrtec at home, but hasn't been taking it (usually uses it in the [place]) Denies sick contacts.    He also had lipids drawn today (past due--he was on vacation) as dose had been lowered.  At the end of his visit, he mentions that he fell when in [place] around 9/18--landed on his left shoulder. He continues to have left shoulder problems--can't raise the arm without assisting it, has some pain at the upper/lateral shoulder at certain points of movements.  Denies any weakness in the hand/forearm, no numbnes, tingling.    ROS: Denies fevers, chills, nausea, vomiting, diarrhea, urinary complaints, bleeding, bruising, rash. +cough Denies chest pain, palpitations, edema +left shoulder pain as per HPI. See HPI for details  PHYSICAL EXAM: Filed Vitals:   05/16/14 1202  BP: 112/70  Pulse: 64  Temp: 97.8 F (36.6 C)    General appearance: Alert, WD/WN, speaking easily in no  distress.  Intermittently    having spasms of dry, hacky cough.                             Skin: warm, no rash                           Head: No sinus tenderness                            Eyes: conjunctiva normal, corneas clear, PERRLA                            Ears: pearly TMs, external ear canals normal                          Nose: septum midline; nasal mucosa mildly edematous with clear    mucus. No erythema            Mouth/throat: MMM, tongue normal; OP: normal                          Neck: supple, no adenopathy, no thyromegaly, nontender  Heart: RRR, normal S1, S2, no murmurs                         Lungs: CTA bilaterally, no wheezes, rales, or rhonchi. Good air    movement.  No wheezes or coughing with forced expiration or    deep breaths.   Psych:  Normal mood, affect, hygiene and grooming   Neuro: Alert and oriented.  Cranial nerves intact.  Normal strength, gait.    Extremities: left shoulder:  Unable to abduct left shoulder to 90 degree (lifts shoulder, only gets to about 75 degrees).  nontender to palpation but area of pain is at superior portion of deltoid muscle when trying to abduct. No bulges noted. Full passive ROM Strength of forearm muscles, triceps, biceps and deltoid normal.   Assessment/Plan:  Seasonal allergies  Cough - Plan: benzonatate (TESSALON) 200 MG capsule, azithromycin (ZITHROMAX) 250 MG tablet  Pure hypercholesterolemia - intolerance of statin at 15m. Current dose has been atorvastatin 161m and lipids drawn today to see if he is still at goal. - Plan: Lipid panel  Left shoulder pain - Plan: Ambulatory referral to Orthopedic Surgery, DG Shoulder Left, Ambulatory referral to Orthopedic Surgery   Drink plenty of fluids (more water). Start taking Zyrtec once daily. Continue to take Mucinex as directed (12 hr formulation--take twice daily) Start using Tessalon (benzonatate) as needed for cough.  If your mucus turns a darker  yellow/green, or if you start having fever, then start the antibiotic. Please try and hold off on taking the antibiotic for at least 2-3 days (unless those changes develop)  Discussed diagnosis and treatment of URI.  Suggested symptomatic OTC remedies. Nasal saline spray for congestion.  Tylenol or Ibuprofen OTC for fever and malaise.  Consider sinus rinses (Neti-pot or Sinus Rinse Kit). Discussed Mucinex, other supportive measures.  Call/return in 3-5 days if symptoms persist/worsen

## 2014-05-16 NOTE — Patient Instructions (Addendum)
Drink plenty of fluids (more water). Start taking Zyrtec once daily. Continue to take Mucinex as directed (12 hr formulation--take twice daily) Start using Tessalon (benzonatate) as needed for cough.  If your mucus turns a darker yellow/green, or if you start having fever, then start the antibiotic. Please try and hold off on taking the antibiotic for at least 2-3 days (unless those changes develop)  Please go to [facility] imaging for x-ray of your shoulder. We are referring you to an orthopedist for further evaluation.

## 2014-05-21 ENCOUNTER — Encounter: Payer: Self-pay | Admitting: Internal Medicine

## 2014-05-21 ENCOUNTER — Other Ambulatory Visit: Payer: Self-pay

## 2014-05-21 LAB — HM DIABETES EYE EXAM

## 2014-05-22 ENCOUNTER — Other Ambulatory Visit (INDEPENDENT_AMBULATORY_CARE_PROVIDER_SITE_OTHER): Payer: Medicare Other

## 2014-05-22 DIAGNOSIS — Z23 Encounter for immunization: Secondary | ICD-10-CM

## 2014-07-10 ENCOUNTER — Other Ambulatory Visit: Payer: Self-pay | Admitting: Family Medicine

## 2014-07-10 NOTE — Telephone Encounter (Signed)
Ok to refill until April.  Ensure CPE or med check is set up (whatever he will be due for ). (if [employer], med check and CPe need to be separate visits)

## 2014-07-10 NOTE — Telephone Encounter (Signed)
Just wanting to make sure that he is indeed due for me check and that the appt in Oct was NOT considered his med check even though labs were drawn, thanks.

## 2014-07-30 ENCOUNTER — Telehealth: Payer: Self-pay | Admitting: Family Medicine

## 2014-07-30 NOTE — Telephone Encounter (Signed)
Pt's wife called wanting to know if Dr Tomi Bamberger has seen the xray pt had done a couple of weeks ago at ortho office

## 2014-07-30 NOTE — Telephone Encounter (Signed)
Advise pt/wife--NO.  Their x-rays aren't in our system (not [facility]). Not only that, we referred him to ortho in October for abnormal shoulder x-ray, and I don't see any scanned notes in media, so I don't think they have faxed any notes updating me on his condition.  Please see if we can get notes (since we did refer him to ortho--I believe it was [facility]).  Thanks

## 2014-08-06 ENCOUNTER — Other Ambulatory Visit: Payer: Self-pay | Admitting: Family Medicine

## 2014-08-06 DIAGNOSIS — E7800 Pure hypercholesterolemia, unspecified: Secondary | ICD-10-CM

## 2014-08-06 NOTE — Telephone Encounter (Signed)
Is this okay?

## 2014-08-07 ENCOUNTER — Telehealth: Payer: Self-pay | Admitting: Family Medicine

## 2014-08-07 NOTE — Telephone Encounter (Signed)
rcd records from [facility]

## 2014-08-13 ENCOUNTER — Encounter: Payer: Self-pay | Admitting: Neurology

## 2014-08-13 ENCOUNTER — Ambulatory Visit (INDEPENDENT_AMBULATORY_CARE_PROVIDER_SITE_OTHER): Payer: BLUE CROSS/BLUE SHIELD | Admitting: Neurology

## 2014-08-13 VITALS — BP 141/77 | HR 77 | Resp 16 | Ht 68.0 in | Wt 184.8 lb

## 2014-08-13 DIAGNOSIS — G5139 Clonic hemifacial spasm, unspecified: Secondary | ICD-10-CM

## 2014-08-13 DIAGNOSIS — I10 Essential (primary) hypertension: Secondary | ICD-10-CM

## 2014-08-13 DIAGNOSIS — R451 Restlessness and agitation: Secondary | ICD-10-CM | POA: Diagnosis not present

## 2014-08-13 DIAGNOSIS — G518 Other disorders of facial nerve: Secondary | ICD-10-CM | POA: Diagnosis not present

## 2014-08-13 DIAGNOSIS — G513 Clonic hemifacial spasm: Secondary | ICD-10-CM

## 2014-08-13 HISTORY — DX: Restlessness and agitation: R45.1

## 2014-08-13 HISTORY — DX: Clonic hemifacial spasm, unspecified: G51.39

## 2014-08-13 NOTE — Patient Instructions (Signed)
Keep a diary of your facial spasms.   abstain from caffeine.

## 2014-08-13 NOTE — Progress Notes (Signed)
Provider:  Larey Seat, M D  Referring Provider: Rita Ohara, MD Primary Care Physician:  Vikki Ports, MD  Chief Complaint  Patient presents with  . muscle spasms right eye    New patient Rm 10    HPI:  Peter [name] is a 81 y.o. male seen here as a referral  from Dr. Rita Ohara for Facial twitching,   Mr. Pavlak presents today for a facial twitching , a  Tic that forces him to close the right eye frequently, not the left. Onset about 18 month ago, unrelated to any surgery or trauma. He has the habit of drinking 1-3 caffeinated drinks a day , sees no correlation. He has had meanwhile, since onset , bilateral cataract surgery , which did not affect the twitching. He feels no stress or nervousity is a trigger. He doesn't have it every day. Sometimes it occurs in the middle of the night.  He carries the diagnosis of hypoertension, hyperlipidemia and diabetes.  No work up thus far for the twitching , no imaging.     Social history , one of 8 siblings, has 8 children .   Childhood asthma until age 37   Review of Systems: Out of a complete 14 system review, the patient complains of only the following symptoms, and all other reviewed systems are negative. Facial right ocularis muscle twitching. Claustrophobic .   History   Social History  . Marital Status: Married    Spouse Name: N/A    Number of Children: 8  . Years of Education: N/A   Occupational History  . 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 Smoking status: Former Smoker    Quit date: 08/09/1974  . Smokeless tobacco: Never Used  . Alcohol Use: Yes     Comment: 1-2 drinks daily, sometimes none  . Drug Use: No  . Sexual Activity:    Partners: Female   Other Topics Concern  . Not on file   Social History Narrative   Lives with wife, dog, cat.  Children live in [place], [place], [place], [place], and 2 daughters here in [place], [place]. 15 grandchildren, 2 great-grandchildren   Patient is right handed.  And drinks caffeine daily.    Family History  Problem Relation Age of Onset  . Alcohol abuse Father   . Cancer Sister     stomach/?pancreatic  . Colon cancer Brother 68  . Cancer Brother     colon cancer in mid 63's  . Arthritis Daughter     psoriatic  . Hyperlipidemia Son   . Diabetes Neg Hx   . Stroke Neg Hx   . Heart disease Neg Hx   . Arthritis Daughter     psoriatic  . Hyperlipidemia Son     Past Medical History  Diagnosis Date  . Hypertension age 89  . Diabetes mellitus diet controlled  . Hiatal hernia   . Hypercholesteremia   . FHx: colon cancer   . Asthma h/o as a child-s/p immunotherapy  . Adenomatous colon polyp 07/2013  . Internal hemorrhoids 07/2013    noted on colonoscopy    Past Surgical History  Procedure Laterality Date  . Cardiovascular stress test  normal  . Appendectomy    . Vasectomy    . Colonoscopy  3/08, 07/2013    no repeat rec due to age per [name]    Current Outpatient Prescriptions  Medication Sig Dispense Refill  . atorvastatin (  LIPITOR) 10 MG tablet TAKE 1 TABLET ONCE DAILY. 90 tablet 1  . carvedilol (COREG) 25 MG tablet TAKE 1 TABLET TWICE DAILY. 180 tablet 0  . cetirizine (ZYRTEC) 10 MG tablet Take 10 mg by mouth as needed.     . fluticasone (FLONASE) 50 MCG/ACT nasal spray Place 2 sprays into both nostrils daily. (Patient taking differently: Place 2 sprays into both nostrils as needed. ) 16 g 11  . lisinopril (PRINIVIL,ZESTRIL) 10 MG tablet TAKE 1 TABLET DAILY. 30 tablet 4   No current facility-administered medications for this visit.    Allergies as of 08/13/2014 - Review Complete 08/13/2014  Allergen Reaction Noted  . Iodine Other (See Comments) 01/14/2011    Vitals: BP 141/77 mmHg  Pulse 77  Resp 16  Ht 5\' 8"  (1.727 m)  Wt 184 lb 12.8 oz (83.825 kg)  BMI 28.11 kg/m2 Last Weight:  Wt Readings from Last 1 Encounters:  08/13/14 184 lb 12.8 oz (83.825 kg)   Last Height:   Ht Readings  from Last 1 Encounters:  08/13/14 5\' 8"  (1.727 m)    Physical exam:  General: The patient is awake, alert and appears not in acute distress. The patient is well groomed. Head: Normocephalic, atraumatic. Neck is supple. Mallampati 3 , neck circumference: 16.  Cardiovascular:  Regular rate and rhythm *, without  murmurs or carotid bruit, and without distended neck veins. Respiratory: Lungs are clear to auscultation. Skin:  Without evidence of edema, or rash Trunk:normal posture.  Neurologic exam : The patient is awake and alert, oriented to place and time.  Memory subjective described as intact.  There is a normal attention span & concentration ability. Speech is fluent without dysarthria, dysphonia or aphasia.  Mood and affect are appropriate.  Cranial nerves: Pupils are equal and briskly reactive to light. Funduscopic exam without  evidence of pallor or edema. Extraocular movements  in vertical and horizontal planes intact and without nystagmus.  Visual fields by finger perimetry are intact. Hearing to finger rub intact.  Facial sensation intact to fine touch. Facial motor strength is affected by the right facial spasms- the right moth is pulled , the eye closed , the brow lower.  The  tongue and uvula move midline.  Tongue protrusion into either cheek is normal. Shoulder shrug is restricted his right shoulder move.   Motor exam:  Normal tone, muscle bulk and symmetric strength in all extremities.  Acromioclavicular click, left shoulder.   Sensory:  Fine touch, pinprick and vibration were  normal.  Coordination: Rapid alternating movements in the fingers/hands were normal. Finger-to-nose maneuver  normal without evidence of ataxia, dysmetria or tremor.  Gait and station: Patient walks without assistive device and is able unassisted to climb up to the exam table. Strength within normal limits. Stance is stable and normal. Tandem gait is unfragmented.  Deep tendon reflexes: in the   upper and lower extremities are symmetric and intact. Babinski maneuver response is  downgoing.   Assessment:  After physical and neurologic examination, review of laboratory studies, imaging, neurophysiology testing and pre-existing records, assessment is that of : Longstanding HTN ( diagnosed at age 86 ) , diabetes and hyperlipidemia , increasing his stroke risk.   physically not very active lifestyle, but no sign of other focal neurological abnormalities. He appears restless. He is constantly moving .  1) hemifascial spasm.    Plan:  Treatment plan and additional workup :  The patient can first try to reduce his caffeine intake,  And  I encouraged him to keep a facial spasm diary.  Visit 45 minutes with 20 minutes of discussion and answering questions.  MRI brain to evaluate white matter disease as a possible cause. Open MRI requested.   I will refer to Dr. Krista Blue for Botox consult.        Asencion Partridge Jovahn Breit MD 08/13/2014

## 2014-08-14 LAB — COMPREHENSIVE METABOLIC PANEL
ALBUMIN: 4.7 g/dL (ref 3.5–4.7)
ALT: 17 IU/L (ref 0–44)
AST: 20 IU/L (ref 0–40)
Albumin/Globulin Ratio: 2 (ref 1.1–2.5)
Alkaline Phosphatase: 69 IU/L (ref 39–117)
BUN / CREAT RATIO: 20 (ref 10–22)
BUN: 22 mg/dL (ref 8–27)
CO2: 24 mmol/L (ref 18–29)
Calcium: 9.4 mg/dL (ref 8.6–10.2)
Chloride: 102 mmol/L (ref 97–108)
Creatinine, Ser: 1.08 mg/dL (ref 0.76–1.27)
GFR, EST AFRICAN AMERICAN: 74 mL/min/{1.73_m2} (ref >59)
GFR, EST NON AFRICAN AMERICAN: 64 mL/min/{1.73_m2} (ref >59)
Globulin, Total: 2.3 g/dL (ref 1.5–4.5)
Glucose: 131 mg/dL — ABNORMAL HIGH (ref 65–99)
Potassium: 5.4 mmol/L — ABNORMAL HIGH (ref 3.5–5.2)
Sodium: 141 mmol/L (ref 134–144)
Total Bilirubin: 0.5 mg/dL (ref 0.0–1.2)
Total Protein: 7 g/dL (ref 6.0–8.5)

## 2014-08-20 ENCOUNTER — Ambulatory Visit (INDEPENDENT_AMBULATORY_CARE_PROVIDER_SITE_OTHER): Payer: Medicare Other | Admitting: Family Medicine

## 2014-08-20 ENCOUNTER — Encounter: Payer: Self-pay | Admitting: Family Medicine

## 2014-08-20 VITALS — BP 163/70 | HR 69 | Ht 69.0 in | Wt 183.0 lb

## 2014-08-20 DIAGNOSIS — S4992XA Unspecified injury of left shoulder and upper arm, initial encounter: Secondary | ICD-10-CM

## 2014-08-20 NOTE — Patient Instructions (Signed)
Your history and exam are consistent with a rotator cuff tear (full thickness tear of the supraspinatus) and secondary frozen shoulder. I would not recommend doing an MRI unless you wanted to pursue surgery and I would not recommend surgery. Nitro patches are an option though the data on these is typically with rotator cuff tendinitis/tendinopathy - however, there is low risk with these (headaches, skin irritation) so let me know if you want to try these. Cortisone shot is an option. If you did any exercises I would recommend simple arm circles, arm swings with gravity.   I doubt physical therapy would help you regain much motion or strength but this is another option.   Heat 15 minutes at a time 3-4 times a day may help with movement and stiffness. Tylenol and/or aleve as needed for pain and inflammation.

## 2014-08-22 ENCOUNTER — Telehealth: Payer: Self-pay | Admitting: Neurology

## 2014-08-22 ENCOUNTER — Encounter: Payer: Self-pay | Admitting: Family Medicine

## 2014-08-22 ENCOUNTER — Ambulatory Visit (INDEPENDENT_AMBULATORY_CARE_PROVIDER_SITE_OTHER): Payer: BLUE CROSS/BLUE SHIELD | Admitting: Family Medicine

## 2014-08-22 VITALS — BP 172/87 | HR 71 | Ht 69.0 in | Wt 183.0 lb

## 2014-08-22 DIAGNOSIS — S4992XA Unspecified injury of left shoulder and upper arm, initial encounter: Secondary | ICD-10-CM | POA: Insufficient documentation

## 2014-08-22 DIAGNOSIS — S4992XD Unspecified injury of left shoulder and upper arm, subsequent encounter: Secondary | ICD-10-CM

## 2014-08-22 DIAGNOSIS — M25512 Pain in left shoulder: Secondary | ICD-10-CM

## 2014-08-22 MED ORDER — METHYLPREDNISOLONE ACETATE 40 MG/ML IJ SUSP
40.0000 mg | Freq: Once | INTRAMUSCULAR | Status: AC
  Administered 2014-08-22: 40 mg via INTRA_ARTICULAR

## 2014-08-22 NOTE — Progress Notes (Signed)
PCP: [name],[name], MD  Subjective:   HPI: Patient is a 81 y.o. male here for left shoulder injury.  Patient reports in August he accidentally tripped over a concrete parking block and fell to left side. Fell onto lateral shoulder and elbow - thinks he stuck elbow out some to break his fall. Difficulty moving left shoulder since then. Pain worse at nighttime. Lying flat on his back is the best. Worse with overhead motions - limited motion in general. Radiographs negative for fracture. Has been taking ibuprofen. No prior injuries.  Past Medical History  Diagnosis Date  . Hypertension age 86  . Diabetes mellitus diet controlled  . Hiatal hernia   . Hypercholesteremia   . FHx: colon cancer   . Asthma h/o as a child-s/p immunotherapy  . Adenomatous colon polyp 07/2013  . Internal hemorrhoids 07/2013    noted on colonoscopy  . 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 Restlessness and agitation 08/13/2014    Current Outpatient Prescriptions on File Prior to Visit  Medication Sig Dispense Refill  . atorvastatin (LIPITOR) 10 MG tablet TAKE 1 TABLET ONCE DAILY. 90 tablet 1  . carvedilol (COREG) 25 MG tablet TAKE 1 TABLET TWICE DAILY. 180 tablet 0  . cetirizine (ZYRTEC) 10 MG tablet Take 10 mg by mouth as needed.     . fluticasone (FLONASE) 50 MCG/ACT nasal spray Place 2 sprays into both nostrils daily. (Patient taking differently: Place 2 sprays into both nostrils as needed. ) 16 g 11  . lisinopril (PRINIVIL,ZESTRIL) 10 MG tablet TAKE 1 TABLET DAILY. 30 tablet 4   No current facility-administered medications on file prior to visit.    Past Surgical History  Procedure Laterality Date  . Cardiovascular stress test  normal  . Appendectomy    . Vasectomy    . Colonoscopy  3/08, 07/2013    no repeat rec due to age per [name]    Allergies  Allergen Reactions  . Iodine Other (See Comments)    Feels like he is "burning up"    History   Social History  . Marital Status: Married   Spouse Name: N/A    Number of Children: 8  . Years of Education: N/A   Occupational History  . 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 Smoking status: Former Smoker    Quit date: 08/09/1974  . Smokeless tobacco: Never Used  . Alcohol Use: 0.0 oz/week    0 Not specified per week     Comment: 1-2 drinks daily, sometimes none  . Drug Use: No  . Sexual Activity:    Partners: Female   Other Topics Concern  . Not on file   Social History Narrative   Lives with wife, dog, cat.  Children live in [place], [place], [place], [place], and 2 daughters here in [place], [place]. 15 grandchildren, 2 great-grandchildren   Patient is right handed.  And drinks caffeine daily.    Family History  Problem Relation Age of Onset  . Alcohol abuse Father   . Cancer Sister     stomach/?pancreatic  . Colon cancer Brother 52  . Cancer Brother     colon cancer in mid 15's  . Arthritis Daughter     psoriatic  . Hyperlipidemia Son   . Diabetes Neg Hx   . Stroke Neg Hx   . Heart disease Neg Hx   . Arthritis Daughter     psoriatic  . Hyperlipidemia Son  BP 163/70 mmHg  Pulse 69  Ht 5\' 9"  (1.753 m)  Wt 183 lb (83.008 kg)  BMI 27.01 kg/m2  Review of Systems: See HPI above.    Objective:  Physical Exam:  Gen: NAD  Left shoulder: No swelling, ecchymoses.  No gross deformity. No TTP AC joint, biceps tendon. Very limited motion - 30 degrees flexion and abduction, 30 degrees ER all actively.  FROM passively. Pain with Hawkins, negative Neers. Negative Yergasons. Strength 5/5 with resisted internal/external rotation.  Unable to perform empty can. Positive drop arm test. Negative apprehension. NV intact distally.    Assessment & Plan:  1. Left shoulder injury - consistent with full thickness supraspinatus tear.  Discussed his options.  Surgery would involve a lot of risks though it would likely regain his motion and some of his strength - he would have a lengthy recovery  and rehabilitation process as well.  He is not interested in this - as such I recommended against doing an MRI.  Alternatively recommended considering cortisone injection for pain, codman exercises, nitro patches.  Physical therapy is a consideration but we also discussed with this type of tear it's unlikely he would regain much especially with how much pain he is in currently.  Heat for spasms.  Tylenol/nsaids as needed.  He is going to call us - thinks he likely wants to do an injection.

## 2014-08-22 NOTE — Assessment & Plan Note (Signed)
consistent with full thickness supraspinatus tear.  Discussed his options.  Surgery would involve a lot of risks though it would likely regain his motion and some of his strength - he would have a lengthy recovery and rehabilitation process as well.  He is not interested in this - as such I recommended against doing an MRI.  Alternatively recommended considering cortisone injection for pain, codman exercises, nitro patches.  Physical therapy is a consideration but we also discussed with this type of tear it's unlikely he would regain much especially with how much pain he is in currently.  Heat for spasms.  Tylenol/nsaids as needed.  He is going to call us - thinks he likely wants to do an injection.

## 2014-08-22 NOTE — Telephone Encounter (Signed)
Patient returning Sandy's call. Please call back cell # [phone].

## 2014-08-23 NOTE — Progress Notes (Signed)
PCP: [name],[name], MD  Subjective:   HPI: Patient is a 81 y.o. male here for left shoulder injury.  08/20/14: Patient reports in August he accidentally tripped over a concrete parking block and fell to left side. Fell onto lateral shoulder and elbow - thinks he stuck elbow out some to break his fall. Difficulty moving left shoulder since then. Pain worse at nighttime. Lying flat on his back is the best. Worse with overhead motions - limited motion in general. Radiographs negative for fracture. Has been taking ibuprofen. No prior injuries.  08/22/14: Patient returns today for subacromial injection for left shoulder.  Past Medical History  Diagnosis Date  . Hypertension age 44  . Diabetes mellitus diet controlled  . Hiatal hernia   . Hypercholesteremia   . FHx: colon cancer   . Asthma h/o as a child-s/p immunotherapy  . Adenomatous colon polyp 07/2013  . Internal hemorrhoids 07/2013    noted on colonoscopy  . 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 Restlessness and agitation 08/13/2014    Current Outpatient Prescriptions on File Prior to Visit  Medication Sig Dispense Refill  . amLODipine (NORVASC) 10 MG tablet Take 10 mg by mouth daily.    Marland Kitchen atorvastatin (LIPITOR) 10 MG tablet TAKE 1 TABLET ONCE DAILY. 90 tablet 1  . carvedilol (COREG) 25 MG tablet TAKE 1 TABLET TWICE DAILY. 180 tablet 0  . cetirizine (ZYRTEC) 10 MG tablet Take 10 mg by mouth as needed.     . fluticasone (FLONASE) 50 MCG/ACT nasal spray Place 2 sprays into both nostrils daily. (Patient taking differently: Place 2 sprays into both nostrils as needed. ) 16 g 11  . lisinopril (PRINIVIL,ZESTRIL) 10 MG tablet TAKE 1 TABLET DAILY. 30 tablet 4   No current facility-administered medications on file prior to visit.    Past Surgical History  Procedure Laterality Date  . Cardiovascular stress test  normal  . Appendectomy    . Vasectomy    . Colonoscopy  3/08, 07/2013    no repeat rec due to age per [name]     Allergies  Allergen Reactions  . Iodine Other (See Comments)    Feels like he is "burning up"    History   Social History  . Marital Status: Married    Spouse Name: N/A    Number of Children: 8  . Years of Education: N/A   Occupational History  . 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 Smoking status: Former Smoker    Quit date: 08/09/1974  . Smokeless tobacco: Never Used  . Alcohol Use: 0.0 oz/week    0 Not specified per week     Comment: 1-2 drinks daily, sometimes none  . Drug Use: No  . Sexual Activity:    Partners: Female   Other Topics Concern  . Not on file   Social History Narrative   Lives with wife, dog, cat.  Children live in [place], [place], [place], [place], and 2 daughters here in [place], [place]. 15 grandchildren, 2 great-grandchildren   Patient is right handed.  And drinks caffeine daily.    Family History  Problem Relation Age of Onset  . Alcohol abuse Father   . Cancer Sister     stomach/?pancreatic  . Colon cancer Brother 64  . Cancer Brother     colon cancer in mid 60's  . Arthritis Daughter     psoriatic  . Hyperlipidemia Son   . Diabetes Neg Hx   .  Stroke Neg Hx   . Heart disease Neg Hx   . Arthritis Daughter     psoriatic  . Hyperlipidemia Son     BP 172/87 mmHg  Pulse 71  Ht 5\' 9"  (1.753 m)  Wt 183 lb (83.008 kg)  BMI 27.01 kg/m2  Review of Systems: See HPI above.    Objective:  Physical Exam:  Gen: NAD Exam not repeated today. Left shoulder: No swelling, ecchymoses.  No gross deformity. No TTP AC joint, biceps tendon. Very limited motion - 30 degrees flexion and abduction, 30 degrees ER all actively.  FROM passively. Pain with Hawkins, negative Neers. Negative Yergasons. Strength 5/5 with resisted internal/external rotation.  Unable to perform empty can. Positive drop arm test. Negative apprehension. NV intact distally.    Assessment & Plan:  1. Left shoulder injury - consistent with full  thickness supraspinatus tear.  Injection given today.  Surgery would involve a lot of risks though it would likely regain his motion and some of his strength - he would have a lengthy recovery and rehabilitation process as well.  He is not interested in this - as such I recommended against doing an MRI.  Codman exercises, consider nitro patches.  Physical therapy is a consideration but we also discussed with this type of tear it's unlikely he would regain much especially with how much pain he is in currently.  Heat for spasms.  Tylenol/nsaids as needed.    After informed written consent, patient was seated on exam table. Left shoulder was prepped with alcohol swab and utilizing posterior approach, patient's left subacromial space was injected with 3:1 marcaine: depomedrol. Patient tolerated the procedure well without immediate complications.

## 2014-08-23 NOTE — Telephone Encounter (Signed)
Alver Sorrow, CMA called pt back with gave lab results.  See result note.

## 2014-08-23 NOTE — Assessment & Plan Note (Signed)
consistent with full thickness supraspinatus tear.  Injection given today.  Surgery would involve a lot of risks though it would likely regain his motion and some of his strength - he would have a lengthy recovery and rehabilitation process as well.  He is not interested in this - as such I recommended against doing an MRI.  Codman exercises, consider nitro patches.  Physical therapy is a consideration but we also discussed with this type of tear it's unlikely he would regain much especially with how much pain he is in currently.  Heat for spasms.  Tylenol/nsaids as needed.    After informed written consent, patient was seated on exam table. Left shoulder was prepped with alcohol swab and utilizing posterior approach, patient's left subacromial space was injected with 3:1 marcaine: depomedrol. Patient tolerated the procedure well without immediate complications.

## 2014-12-03 ENCOUNTER — Encounter: Payer: Self-pay | Admitting: Sports Medicine

## 2014-12-03 ENCOUNTER — Ambulatory Visit (INDEPENDENT_AMBULATORY_CARE_PROVIDER_SITE_OTHER): Payer: Medicare Other | Admitting: Sports Medicine

## 2014-12-03 VITALS — BP 143/78 | Ht 69.0 in | Wt 183.0 lb

## 2014-12-03 DIAGNOSIS — M75120 Complete rotator cuff tear or rupture of unspecified shoulder, not specified as traumatic: Secondary | ICD-10-CM | POA: Insufficient documentation

## 2014-12-03 DIAGNOSIS — M75122 Complete rotator cuff tear or rupture of left shoulder, not specified as traumatic: Secondary | ICD-10-CM

## 2014-12-03 DIAGNOSIS — S4992XD Unspecified injury of left shoulder and upper arm, subsequent encounter: Secondary | ICD-10-CM

## 2014-12-03 MED ORDER — METHYLPREDNISOLONE ACETATE 40 MG/ML IJ SUSP
40.0000 mg | Freq: Once | INTRAMUSCULAR | Status: AC
  Administered 2014-12-03: 40 mg via INTRA_ARTICULAR

## 2014-12-03 NOTE — Progress Notes (Signed)
Peter [name] - 81 y.o. male MRN [id number]  Date of birth: Nov 21, 1929  SUBJECTIVE:  Including CC & ROS.  Patient is a pleasant 81 year old male presents today with left shoulder pain that has been a chronic issue for approximately one year. Patient reports that starting sometime in 2015 he started having some decreased range of motion and pain with overhead activity in his left shoulder.  Then in August 2015 he fell while visiting his son in [place] and developed significant pain in his left shoulder.  Following this fall he developed significant weakness decreased range of motion and global shoulder pain. Pain has been worse at night making it difficult to sleep particularly on the left side. At this point patient lacks range of motion and overhead activities and external rotation.  Initially following injury he was seen by [facility] orthopedics who suspected rotator cuff tear and they discussed the merits of MRI and surgery which they did not proceed with given patient's age.  Patient was then seen by Peter [name] in January 2016 who based on his exam suspected full-thickness supraspinatus tear. Patient was treated with a subacromial injection. Patient reports after injection he only received about 3 days of pain relief. Again they discussed the merits of surgery and decided not to proceed with MRI given the patient was not interested in surgical intervention due to the risk of surgery and long recovery.  Patient was sent for physical therapy as well to work on strengthening and mobility which she reports did not improve his function.  Currently patient's main goal of treatment is to obtain some pain control. He is interested in may be repeating injection therapy today. He reports to these comfortable with the function and range of motion he currently has if this is what he is limited to long-term.   ROS: Review of systems otherwise negative except for information present in HPI  HISTORY:  Past Medical, Surgical, Social, and Family History Reviewed & Updated per EMR. Pertinent Historical Findings include: HTN and HLD well controlled on low dose medications  DATA REVIEWED: Xray should mild DJD, humeral head shifted upward indicating suspected rotator cuff tear, normal before meals jointand irregularity significant seen on x-ray. Small possible inferior pole of the glenoid fracture. An obvious loss of subacromial space due to humeral head for shifting.  PHYSICAL EXAM:  VS: BP:(!) 143/78 mmHg  HR: bpm  TEMP: ( )  RESP:   HT:5\' 9"  (175.3 cm)   WT:183 lb (83.008 kg)  BMI:27.1 RIGHT SHOULDER EXAM:  General: well nourished Skin of UE: warm; dry, no rashes, lesions, ecchymosis or erythema. Vascular: radial pulses 2+ bilaterally Neurologically: Normal sensation with no sensory or motor defects in C4-C8, bilateral Palpation: no tenderness over the [facility] joint, acromion, no bicipital grove tenderness, Interval common rotator cuff insertion site ROM active/passive: Active ROM in flexion and abduction to approximately 110, passive ROM proximally 150. Internal rotation symmetric at 70, external rotation limited on the left to about 20 active and 30 passive. Appley's scratch test sacrum on the left and at level of L4 on the right Strength testing: Positive drop arm test and significant weakness in forward flexion and scapular flexion 2/5, 4/5 internal rotation, 3/5 external rotation compared to the right     Special Test: No signs of impingement or testing given patient's significant weakness and drop arm  MSK [place]: Normal biceps tendon, cortical irregularity of the humeral head, subscapularis with calcifications and partial tears, full thickness with a least 3 cm retraction  of the supraspinatus, full thickness tear of the infraspinatus approximately 2 cm with mild retraction, intact teres minor, normal AC joint  ASSESSMENT & PLAN: See problem based charting & AVS for pt  instructions. Impression: 1. 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 Small partial tears to the other rotator cuff muscles of the subscapularis 3. History of hypertension history of hyperlipidemia  Recommendations: -Discuss with patient we agree with Peter [name] in orthopedic surgeon that MRI is probably not to be effective in evaluating his shoulder given his exam and ultrasound exam today indicating full-thickness tear. -Ultimate goal this point is to provide him with some pain control and to help him maintain his function. -We will her agreeable with repeating intra-articular cortisone injection today under ultrasound guidance. Patient tolerated procedure well -Will follow-up in 3-4 weeks to reassess patient's pain control and function. If no improvements at that point in time would consider using medications to provide some additional pain control with possibly tramadol and amitriptyline.  The patient's clinical condition is marked by substantial pain and/or significant functional disability. Other conservative therapy has not provided relief, is contraindicated, or not appropriate. There is a reasonable likelihood that cortisone injection will signicantly improve the patient's pain and/or functional disability.  Injection procedure: Consent obtained and verified. Sterile betadine prep. Furthur cleansed with alcohol and betadine. Topical analgesic spray: Ethyl chloride. Injection Indication: Full thickness rotator cuff tear for pain control Approached in typical fashion with: Intra-articular glenoid injection under ultrasound guidance the posterior lateral aspect of the left shoulder Meds: 80 mg Depo-Medrol 1 mL and 3 mL of lidocaine 1% Needle: 25-gauge 1/2 inch Completed without difficulty Aftercare instructions and Red flags advised. Advised to call if fevers/chills, erythema, induration, drainage, or persistent bleeding.

## 2014-12-04 ENCOUNTER — Other Ambulatory Visit: Payer: Self-pay | Admitting: Family Medicine

## 2014-12-18 ENCOUNTER — Ambulatory Visit: Payer: Medicare Other | Admitting: Sports Medicine

## 2014-12-18 ENCOUNTER — Encounter: Payer: Self-pay | Admitting: Family Medicine

## 2014-12-18 ENCOUNTER — Ambulatory Visit (INDEPENDENT_AMBULATORY_CARE_PROVIDER_SITE_OTHER): Payer: Medicare Other | Admitting: Family Medicine

## 2014-12-18 VITALS — BP 140/80 | HR 64 | Ht 68.5 in | Wt 184.6 lb

## 2014-12-18 DIAGNOSIS — E119 Type 2 diabetes mellitus without complications: Secondary | ICD-10-CM | POA: Insufficient documentation

## 2014-12-18 DIAGNOSIS — Z0000 Encounter for general adult medical examination without abnormal findings: Secondary | ICD-10-CM

## 2014-12-18 DIAGNOSIS — Z125 Encounter for screening for malignant neoplasm of prostate: Secondary | ICD-10-CM | POA: Diagnosis not present

## 2014-12-18 DIAGNOSIS — E7800 Pure hypercholesterolemia, unspecified: Secondary | ICD-10-CM

## 2014-12-18 DIAGNOSIS — I10 Essential (primary) hypertension: Secondary | ICD-10-CM

## 2014-12-18 LAB — POCT URINALYSIS DIPSTICK
BILIRUBIN UA: NEGATIVE
GLUCOSE UA: NEGATIVE
Ketones, UA: NEGATIVE
Leukocytes, UA: NEGATIVE
Nitrite, UA: NEGATIVE
Protein, UA: NEGATIVE
RBC UA: NEGATIVE
Urobilinogen, UA: NEGATIVE
pH, UA: 6

## 2014-12-18 LAB — CBC WITH DIFFERENTIAL/PLATELET
Basophils Absolute: 0.1 10*3/uL (ref 0.0–0.1)
Basophils Relative: 1 % (ref 0–1)
Eosinophils Absolute: 0.3 10*3/uL (ref 0.0–0.7)
Eosinophils Relative: 5 % (ref 0–5)
HCT: 40.1 % (ref 39.0–52.0)
Hemoglobin: 13.5 g/dL (ref 13.0–17.0)
LYMPHS PCT: 14 % (ref 12–46)
Lymphs Abs: 0.9 10*3/uL (ref 0.7–4.0)
MCH: 32.2 pg (ref 26.0–34.0)
MCHC: 33.7 g/dL (ref 30.0–36.0)
MCV: 95.7 fL (ref 78.0–100.0)
MPV: 8.8 fL (ref 8.6–12.4)
Monocytes Absolute: 0.8 10*3/uL (ref 0.1–1.0)
Monocytes Relative: 13 % — ABNORMAL HIGH (ref 3–12)
NEUTROS ABS: 4.3 10*3/uL (ref 1.7–7.7)
NEUTROS PCT: 67 % (ref 43–77)
PLATELETS: 246 10*3/uL (ref 150–400)
RBC: 4.19 MIL/uL — AB (ref 4.22–5.81)
RDW: 13.1 % (ref 11.5–15.5)
WBC: 6.4 10*3/uL (ref 4.0–10.5)

## 2014-12-18 LAB — COMPREHENSIVE METABOLIC PANEL
ALBUMIN: 4.3 g/dL (ref 3.5–5.2)
ALT: 22 U/L (ref 0–53)
AST: 22 U/L (ref 0–37)
Alkaline Phosphatase: 58 U/L (ref 39–117)
BUN: 19 mg/dL (ref 6–23)
CALCIUM: 9.1 mg/dL (ref 8.4–10.5)
CHLORIDE: 102 meq/L (ref 96–112)
CO2: 26 meq/L (ref 19–32)
Creat: 1.03 mg/dL (ref 0.50–1.35)
Glucose, Bld: 127 mg/dL — ABNORMAL HIGH (ref 70–99)
POTASSIUM: 4.5 meq/L (ref 3.5–5.3)
SODIUM: 138 meq/L (ref 135–145)
TOTAL PROTEIN: 6.8 g/dL (ref 6.0–8.3)
Total Bilirubin: 0.9 mg/dL (ref 0.2–1.2)

## 2014-12-18 LAB — POCT GLYCOSYLATED HEMOGLOBIN (HGB A1C): Hemoglobin A1C: 6

## 2014-12-18 LAB — LIPID PANEL
Cholesterol: 151 mg/dL (ref 0–200)
HDL: 59 mg/dL (ref >=40)
LDL Cholesterol: 76 mg/dL (ref 0–99)
TRIGLYCERIDES: 82 mg/dL (ref <150)
Total CHOL/HDL Ratio: 2.6 Ratio
VLDL: 16 mg/dL (ref 0–40)

## 2014-12-18 LAB — TSH: TSH: 3.845 u[IU]/mL (ref 0.350–4.500)

## 2014-12-18 NOTE — Progress Notes (Signed)
Chief Complaint  Patient presents with  . Annual Exam    fasting annual exam, no concerns. Did not do eye exam as he is seeing Dr.Tanner next week.    Peter [name] is a 81 y.o. male who presents for a complete physical.  He has the following concerns:  Twitching of right eye/face.  He has discussed this with his ophtho, who offered Botox as a potential treatment.  He has also seen neurologist.  Apparently insurance requires MRI prior to approving Botox (but neuro didn't feel it was necessary).  It isn't too bothersome to him right now, so opting not to treat at this time.  Seeing [name] (and also saw [name]) for left rotator cuff tear and shoulder pain. This was a complication of a fall he had back in 03/2014.  He has significantly limited ROM. Pain is mostly at night, when sleeping.  He got cortisone shot 2 weeks ago, which has helped a lot with his pain.  Starting to notice some twinges of pain coming back.  Hypertension follow-up:  Blood pressures elsewhere are 140-150/60.  Denies dizziness (some lightheadedness only if he stands too quickly), headaches, chest pain.  Denies side effects of medications.  He admits to taking the coreg only once daily.  The amlodipine was stopped last year (he had stopped it on his own, had problems with some swelling); lisinopril had been added, and BP was well controlled on that regimen.   Hyperlipidemia follow-up:  Patient is reportedly following a low-fat, low cholesterol diet.  Compliant with medications and denies medication side effects  Diabetes:  He has never taken medication, just diet controlled.  Goes to ophho regularly.  Had cataract surgery--things seems brighter, but eye seems "congested".  Has follow up this week with [name].  Denies polydipsia, polyuria.  Doesn't really watch his diet much at all.   Immunization History  Administered Date(s) Administered  . Influenza Split 05/06/2011, 05/25/2012  . Influenza, High Dose  Seasonal PF 05/21/2013, 05/22/2014  . Pneumococcal Conjugate-13 11/28/2013  . Pneumococcal Polysaccharide-23 08/09/2006  . Tdap 05/06/2011  . Zoster 06/01/2012   Last colonoscopy: 07/2013, [name]  Last PSA: 1 year ago Dentist: twice yearly  Ophtho: yearly; had cataract surgery recently Exercise: "not enough". Active with work around the house, and walks occasionally with wife. Limited by muscle fatigue in his leg and his buttock. But he walked around [place] all week last week without problems  He has a living will and healthcare power of attorney See depression and ADL screening questionnaires in computer. Known stable hearing loss).  Past Medical History  Diagnosis Date  . Hypertension age 42  . Diabetes mellitus diet controlled  . Hiatal hernia   . Hypercholesteremia   . FHx: colon cancer   . Asthma h/o as a child-s/p immunotherapy  . Adenomatous colon polyp 07/2013  . Internal hemorrhoids 07/2013    noted on colonoscopy  . 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 Restlessness and agitation 08/13/2014    Past Surgical History  Procedure Laterality Date  . Cardiovascular stress test  normal  . Appendectomy    . Vasectomy    . Colonoscopy  3/08, 07/2013    no repeat rec due to age per [name]  . Cataract extraction, bilateral  07/2014, 08/2014    History   Social History  . Marital Status: Married    Spouse Name: Peter [name]  . Number of Children: 8  . Years of Education: Peter [name]  Occupational History  . 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 Smoking status: Former Smoker    Quit date: 08/09/1974  . Smokeless tobacco: Never Used  . Alcohol Use: 0.0 oz/week    0 Standard drinks or equivalent per week     Comment: 1-2 drinks daily, sometimes none  . Drug Use: No  . Sexual Activity:    Partners: Female   Other Topics Concern  . Not on file   Social History Narrative   Lives with wife, dog.  Children live in [place], [place], [place], [place], and 2  daughters here in [place], [place]. 15 grandchildren, 3 great-grandchildren   Patient is right handed.  And drinks caffeine daily.    Family History  Problem Relation Age of Onset  . Alcohol abuse Father   . Cancer Sister     stomach/?pancreatic  . Colon cancer Brother 11  . Cancer Brother     colon cancer in mid 26's  . Arthritis Daughter     psoriatic  . Hyperlipidemia Son   . Diabetes Neg Hx   . Stroke Neg Hx   . Heart disease Neg Hx   . Arthritis Daughter     psoriatic  . Hyperlipidemia Son    Outpatient Encounter Prescriptions as of 12/18/2014  Medication Sig  . atorvastatin (LIPITOR) 10 MG tablet TAKE 1 TABLET ONCE DAILY.  . carvedilol (COREG) 25 MG tablet TAKE 1 TABLET TWICE DAILY.  Marland Kitchen lisinopril (PRINIVIL,ZESTRIL) 10 MG tablet TAKE 1 TABLET DAILY.  . cetirizine (ZYRTEC) 10 MG tablet Take 10 mg by mouth as needed.   . fluticasone (FLONASE) 50 MCG/ACT nasal spray Place 2 sprays into both nostrils daily. (Patient not taking: Reported on 12/18/2014)  . [DISCONTINUED] amLODipine (NORVASC) 10 MG tablet Take 10 mg by mouth daily.   No facility-administered encounter medications on file as of 12/18/2014.    Allergies  Allergen Reactions  . Iodine Other (See Comments)    Feels like he is "burning up"   ROS: The patient denies anorexia, fever, headaches, vision loss, ear pain, hoarseness, exertional chest pain (just occasional tightness after walking after eating; intermittent and relieved by Tums), palpitations, syncope, dyspnea on exertion, cough, swelling, nausea, vomiting, diarrhea, constipation, abdominal pain, melena, hematochezia, hematuria, incontinence, weakened urine stream, dysuria, genital lesions, numbness, tingling, weakness, tremor, suspicious skin lesions, depression, anxiety, abnormal bleeding/bruising, or enlarged lymph nodes. No insomnia +stable hearing loss.  Pain in left shoulder; no other joint pain, denies back pain. Some indigestion after spicy foods  and tomato sauce. Up 2-3 times/night to void (declines medications, previously offered by [name]); falls right back to sleep +ED, mild, overall is fine, doesn't need medication. Sees dermatologist regularly (twice yearly) Spring allergies-- Flonase has been helpful, uses sporadically, along with zyrtec. Currently denies significant congestion, no cough. Currently without complaints (season for him has ended) +fatigue Intermittent muscle fatigue in his leg and buttock with exertion, but sporadic--able to walk all around [place] for a wedding without problems.  PHYSICAL EXAM:  BP 140/80 mmHg  Pulse 64  Ht 5' 8.5" (1.74 m)  Wt 184 lb 9.6 oz (83.734 kg)  BMI 27.66 kg/m2 150/62 on repeat by MD, RA General Appearance:  Alert, cooperative, no distress, appears stated age. Intermittent twitching of right eye/face  Head:  Normocephalic, without obvious abnormality, atraumatic   Eyes:  PERRL, conjunctiva/corneas clear, EOM's intact, fundi  benign   Ears:  Normal TM's and external ear canals   Nose:  Nares normal, mucosa mildly edematous, no erythema or purulence, no sinus tenderness   Throat:  Lips, mucosa, and tongue normal; teeth and gums normal   Neck:  Supple, no lymphadenopathy; thyroid: no enlargement/tenderness/nodules; no carotid  bruit or JVD   Back:  Spine nontender, no curvature, ROM normal, no CVA tenderness. nontender at SI joints and sciatic notch  Lungs:  Clear to auscultation bilaterally without wheezes, rales or ronchi; respirations unlabored   Chest Wall:  No tenderness or deformity   Heart:  Regular rate and rhythm, S1 and S2 normal, no murmur, rub  or gallop   Breast Exam:  No chest wall tenderness, masses or gynecomastia   Abdomen:  Soft, non-tender, nondistended, normoactive bowel sounds,  no masses, no hepatosplenomegaly   Genitalia:  Normal male external genitalia without lesions. Testicles without masses. No inguinal hernias.    Rectal:  Normal sphincter tone, no masses or tenderness; guaiac negative stool. Prostate smooth, no nodules, not enlarged.   Extremities:  No clubbing, cyanosis or edema   Pulses:  2+ and symmetric all extremities   Skin:  Skin color, texture, turgor normal, no rashes or lesions. Many scattered AKs  Lymph nodes:  Cervical, supraclavicular, and axillary nodes normal   Neurologic:  CNII-XII intact, normal strength, sensation and gait; reflexes 1+ and symmetric throughout.  Negative SLR   Psych: Normal mood, affect, hygiene and grooming        Lab Results  Component Value Date   HGBA1C 6.0 12/18/2014   Urine dip: normal, SG >1.030  ASSESSMENT/PLAN  Annual physical exam - Plan: POCT Urinalysis Dipstick, Lipid panel, Comprehensive metabolic panel, CBC with Differential/Platelet, TSH, PSA, Medicare  Diabetes mellitus with no complication - Plan: HgB A1c, Comprehensive metabolic panel, TSH, Microalbumin / creatinine urine ratio  Essential hypertension, benign - suboptimally controlled per home numbers, likely related to taking coreg only once daily, and weight gain. low sodium diet. - Plan: Comprehensive metabolic panel  Pure hypercholesterolemia - Plan: Lipid panel  Type 2 diabetes mellitus without complication - diet controlled; encouraged daily exercise and weight loss  Screening for prostate cancer - Plan: PSA, Medicare   c-met, lipid, PSA medicare, CBC, urine microalbm, TSH  Hypertension:  Increase the coreg to twice daily.  Continue the lisinopril. If BP remains >423 systolic, then we may need to add back a low dose amlodipine (had swelling on 25m).  Mention your leg concerns to Dr. FOneida Alar(? Related to back issue such as spinal stenosis, vs sciatica).  This seems intermittent, and is possible that you might benefit from some stretches or other evaluation.  F/u 6 months, sooner prn

## 2014-12-18 NOTE — Patient Instructions (Signed)
  HEALTH MAINTENANCE RECOMMENDATIONS:  It is recommended that you get at least 30 minutes of aerobic exercise at least 5 days/week (for weight loss, you may need as much as 60-90 minutes). This can be any activity that gets your heart rate up. This can be divided in 10-15 minute intervals if needed, but try and build up your endurance at least once a week.  Weight bearing exercise is also recommended twice weekly.  Eat a healthy diet with lots of vegetables, fruits and fiber.  "Colorful" foods have a lot of vitamins (ie green vegetables, tomatoes, red peppers, etc).  Limit sweet tea, regular sodas and alcoholic beverages, all of which has a lot of calories and sugar.  Up to 2 alcoholic drinks daily may be beneficial for men (unless trying to lose weight, watch sugars).  Drink a lot of water.  Sunscreen of at least SPF 30 should be used on all sun-exposed parts of the skin when outside between the hours of 10 am and 4 pm (not just when at beach or pool, but even with exercise, golf, tennis, and yard work!)  Use a sunscreen that says "broad spectrum" so it covers both UVA and UVB rays, and make sure to reapply every 1-2 hours.  Remember to change the batteries in your smoke detectors when changing your clock times in the spring and fall.  Use your seat belt every time you are in a car, and please drive safely and not be distracted with cell phones and texting while driving.   Hypertension:  Increase the coreg to twice daily.  Continue the lisinopril. If BP remains >751 systolic, then we may need to add back a low dose amlodipine (had swelling on 10mg ).  Call us with your blood pressure results within th enext month if your BP remains >140-150 despite taking the coreg twice daily.   Mention your leg concerns to [name] (? Related to back issue such as spinal stenosis, vs sciatica).  This seems intermittent, and is possible that you might benefit from some stretches or other evaluation.

## 2014-12-19 ENCOUNTER — Ambulatory Visit (INDEPENDENT_AMBULATORY_CARE_PROVIDER_SITE_OTHER): Payer: Medicare Other | Admitting: Sports Medicine

## 2014-12-19 ENCOUNTER — Encounter: Payer: Self-pay | Admitting: Family Medicine

## 2014-12-19 ENCOUNTER — Encounter: Payer: Self-pay | Admitting: Sports Medicine

## 2014-12-19 VITALS — BP 143/68 | Ht 69.0 in | Wt 184.0 lb

## 2014-12-19 DIAGNOSIS — M75122 Complete rotator cuff tear or rupture of left shoulder, not specified as traumatic: Secondary | ICD-10-CM

## 2014-12-19 LAB — MICROALBUMIN / CREATININE URINE RATIO
CREATININE, URINE: 197.9 mg/dL
MICROALB UR: 0.8 mg/dL (ref <2.0)
MICROALB/CREAT RATIO: 4 mg/g (ref 0.0–30.0)

## 2014-12-19 LAB — PSA, MEDICARE: PSA: 2.7 ng/mL (ref <=4.00)

## 2014-12-19 MED ORDER — NITROGLYCERIN 0.2 MG/HR TD PT24: MEDICATED_PATCH | TRANSDERMAL | Status: DC

## 2014-12-19 NOTE — Progress Notes (Signed)
Patient ID: Peter [name], male   DOB: November 04, 1929, 81 y.o.   MRN: [id number]  Patient was seen in just over 2 weeks ago with a painful left shoulder He had seen orthopedics before and [name] before Felt he had a complete rotator cuff tear on the left This is what we found as well A corticosteroid injection done earlier have not helped very much  On last visit we did a higher volume corticosteroid injection with US guidance intra-articular He states that his pain is 70% less He was now able to put his belt on and lift his arm high enough to put on a shirt He can now reach behind his back on the left  Over the past week he has been sleeping through the night again without the shoulder waking him up  Physical examination Pleasant older male in no acute distress BP 143/68 mmHg  Ht 5\' 9"  (1.753 m)  Wt 184 lb (83.462 kg)  BMI 27.16 kg/m2  Left shoulder Limited forward flexion to 70 but I can passively go to 140 Abduction limited to 60 and I can passively go to 120 Back scratch to L5 this is significantly improved Internal rotation is only about 10 less than normal External rotation at waist level is about 15 less than normal

## 2014-12-19 NOTE — Assessment & Plan Note (Signed)
This is complicated by some element of adhesive capsulitis  Improved with IA injection  Work on [employer] exercises using a table until able to wall climb  Start NTG protocol  Reck 1 month

## 2014-12-19 NOTE — Patient Instructions (Signed)
Nitroglycerin Protocol   Apply 1/4 nitroglycerin patch to affected area daily.  Change position of patch within the affected area every 24 hours.  You may experience a headache during the first 1-2 weeks of using the patch, these should subside.  If you experience headaches after beginning nitroglycerin patch treatment, you may take your preferred over the counter pain reliever.  Another side effect of the nitroglycerin patch is skin irritation or rash related to patch adhesive.  Please notify our office if you develop more severe headaches or rash, and stop the patch.  Tendon healing with nitroglycerin patch may require 12 to 24 weeks depending on the extent of injury.  Men should not use if taking Viagra, Cialis, or Levitra.   Do not use if you have migraines or rosacea.    Exercises as noted on the sheet, using the table as a prop

## 2015-01-21 ENCOUNTER — Other Ambulatory Visit: Payer: Self-pay | Admitting: Family Medicine

## 2015-01-22 ENCOUNTER — Ambulatory Visit: Payer: Medicare Other | Admitting: Sports Medicine

## 2015-01-23 ENCOUNTER — Other Ambulatory Visit: Payer: Self-pay | Admitting: Family Medicine

## 2015-02-13 ENCOUNTER — Other Ambulatory Visit: Payer: Self-pay | Admitting: Family Medicine

## 2015-05-28 ENCOUNTER — Other Ambulatory Visit: Payer: Self-pay | Admitting: Family Medicine

## 2015-06-25 ENCOUNTER — Encounter: Payer: Self-pay | Admitting: Family Medicine

## 2015-06-25 ENCOUNTER — Ambulatory Visit (INDEPENDENT_AMBULATORY_CARE_PROVIDER_SITE_OTHER): Payer: Medicare Other | Admitting: Family Medicine

## 2015-06-25 VITALS — BP 132/62 | HR 68 | Ht 68.5 in | Wt 189.0 lb

## 2015-06-25 DIAGNOSIS — Z125 Encounter for screening for malignant neoplasm of prostate: Secondary | ICD-10-CM | POA: Diagnosis not present

## 2015-06-25 DIAGNOSIS — Z23 Encounter for immunization: Secondary | ICD-10-CM | POA: Diagnosis not present

## 2015-06-25 DIAGNOSIS — Z5181 Encounter for therapeutic drug level monitoring: Secondary | ICD-10-CM | POA: Diagnosis not present

## 2015-06-25 DIAGNOSIS — I10 Essential (primary) hypertension: Secondary | ICD-10-CM

## 2015-06-25 DIAGNOSIS — E7800 Pure hypercholesterolemia, unspecified: Secondary | ICD-10-CM | POA: Diagnosis not present

## 2015-06-25 DIAGNOSIS — E119 Type 2 diabetes mellitus without complications: Secondary | ICD-10-CM | POA: Diagnosis not present

## 2015-06-25 LAB — POCT GLYCOSYLATED HEMOGLOBIN (HGB A1C): HEMOGLOBIN A1C: 6.2

## 2015-06-25 NOTE — Progress Notes (Signed)
Chief Complaint  Patient presents with  . Hypertension    fasting med check. Had cataract surgery Nov 2015 and Jan 2016-ever since then he has been kind of "off balance" and feels like his eyes are crossing. Wonders if he has some kind of inner ear problem. Dr Satira Sark (eye surgeon) is aware of this but says everything is fine.    Hypertension follow-up: Blood pressures at home this morning was 152/66, pulse 70.  It is always lower here than when he checks at home.  He forgot to bring his monitor in to have it checked, nor did he bring list of BP's.  Machine is about 79 years old.  He checks his BP about 1-2x/month, and recalls it running 140/60-70.  He was told to increase his Coreg to twice daily after his last visit 6 months ago, due to elevated BP's (he had only been taking it once daily). He admits that he never takes it twice daily. We had also discussed that if BP remained elevated, we would need to restart a low dose amlodipine (he previously had swelling on $RemoveBef'10mg'[id number]$  dose).  He reports that the Coreg CR is the only medication that has gotten his BP controlled, but his insurance plan doesn't cover the CR. Denies dizziness (some lightheadedness only if he stands too quickly--he admits he doesn't drink any water), headaches, chest pain. Denies side effects of medications. "I eat entirely too much salt".  Dips foods in salt.  He sees Dr. Acie Fredrickson, past due (last seen 04/2014)--he is trying to decide if he wants to see another cardiologist. He denies any change in his chest symptoms over many years, but wonders if he should have another stress test.  Hyperlipidemia follow-up: Compliant with medications and denies medication side effects.    He admits to a poor diet.  He eats cheese and pretzels with a glass of red wine, with olives, as a snack.  He doesn't limit anything in particular (as far as sugar, cholesterol, or salt).  He snacks on unsalted nuts.  Diabetes: He has never taken medication.Denies  polydipsia, polyuria. Doesn't really watch his diet much at all.  He eats candy (not daily, but doesn't limit himself, eats it when he wants it). Goes to ophtho regularly. Had cataract surgery-he has some ongoing issues with his eye (related to the ducts). Eyes feel watery.  He feels like his vision is slightly blurry when using both eyes together (no problems with individual eyes).  He has upcoming appointment with Dr. Claudean Kinds to discuss this.  Twitching of right eye/face--unchanged. He has discussed this with his ophtho, who in the past has offered Botox as a potential treatment. He has also seen neurologist. Apparently insurance requires MRI prior to approving Botox (but neuro didn't feel it was necessary). It isn't too bothersome to him right now, so opting not to treat at this time.  It hasn't gotten any worse in the last 6 months, still doesn't wish to pursue. He feels like he also sees some weakness in his mouth, like a Bell's Palsy.  Hasn't noticed any change.  Allergies: in the Spring, treated with Flonase and zyrtec.  He does report constant runny nose since his eye surgery.  Occasional sneeze, itchy eyes.  Slight postnasal drip, no cough.  Slight dry cough at night relieved by cough drop.  PMH, PSH, SH reviewed. \ Outpatient Encounter Prescriptions as of 06/25/2015  Medication Sig Note  . atorvastatin (LIPITOR) 10 MG tablet TAKE 1 TABLET ONCE DAILY.   Marland Kitchen  carvedilol (COREG) 25 MG tablet TAKE 1 TABLET TWICE DAILY.   Marland Kitchen lisinopril (PRINIVIL,ZESTRIL) 10 MG tablet TAKE 1 TABLET DAILY.   . cetirizine (ZYRTEC) 10 MG tablet Take 10 mg by mouth as needed.  12/18/2014: Hasn't needed in 2-3 months  . fluticasone (FLONASE) 50 MCG/ACT nasal spray USE 2 SPRAYS IN EACH NOSTRIL ONCE DAILY (Patient not taking: Reported on 06/25/2015)   . [DISCONTINUED] nitroGLYCERIN (MINITRAN) 0.2 mg/hr patch Place 1/4 patch to the affected area daily. (Patient not taking: Reported on 06/25/2015)    No  facility-administered encounter medications on file as of 06/25/2015.   Allergies  Allergen Reactions  . Iodine Other (See Comments)    Feels like he is "burning up"   ROS:  Denies fever, chills, chest pain, shortness of breath (just the same discomfort with walking, intermittently, as previously described x years).  No problems on recent vacation at [place] with lots of walking.  Denies nausea, vomiting, bowel changes, bleeding, bruising, rash. Denies edema, URI symptoms (see HPI).  PHYSICAL EXAM: BP 132/62 mmHg  Pulse 68  Ht 5' 8.5" (1.74 m)  Wt 189 lb (85.73 kg)  BMI 28.32 kg/m2 Well developed, pleasant male in no distress HEENT: PERRL, EOMI, conjunctiva clear.  No crusting or drainage noted. Twitching of the right eye, and some facial asymmetry with movement of his right mouth. Swelling noted L>R below his eyes (since his cataract surgery, unchanged, per pt). Nasal mucosa is normal.  OP is clear.  Sinuses arenontender Neck: no lymphadenopathy, thyromegaly or carotid bruit. Heart: regular rate and rhythm, no murmur Lungs: clear bilaterally Back: no CVA or spinal tenderness Abdomen: soft, nontender, no organomegaly or mass Extremities: no edema, 2+ pulses Skin: very dry skin on legs. Psych: normal mood, affect, hygiene and grooming Neuro: alert and oriented.  Some right-sided facial weakness and twitching as above. Otherwise normal neuro exam.  Normal gait, strength Psych: normal mood, affect, hygiene and grooming   Lab Results  Component Value Date   HGBA1C 6.2 06/25/2015   ASSESSMENT/PLAN:  Diabetes mellitus without complication ([place]) - never on meds; diet reviewed. weight loss recommended - Plan: HgB A1c  Need for prophylactic vaccination and inoculation against influenza - Plan: Flu vaccine HIGH DOSE PF (Fluzone High dose)  Pure hypercholesterolemia - controlled on current meds, continue  Essential hypertension, benign - elevated at home, fine here. bring monitor  to check accuracy. low sodium diet reviewed, take coreg BID. May need amlodipine if truly elevated  Type 2 diabetes mellitus without complication, without long-term current use of insulin ([facility])   Poss Bell's palsy, stable and unchanged.  Has seen neuro. Doesn't wish to pursue MRI and botox at this time.  Please try and cut back on the salt in your diet.  This will help keep your blood pressure lower (so more medication isn't needed). It is important that you take the coreg TWICE DAILY--start taking one at bedtime (since your daytime dose is around noon). Please try and check your blood pressure and keep a record of it--use columns for the date, morning, evening and comments. You don't need to check it twice daily, but put the number in the appropriate column.  Use the comment section to put in how you feel (ie headache, dizzy), if you missed your medication, if you ate something out of the ordinary (ie high after chinese food). Bring both your monitor and this record to your next visit in May. Return soon (with monitor and record) if your blood pressure is  consistently >140/90.  30-40 minute visit, more than 1/2 spent counseling.  AWV/CPE/med check 6 months with fasting labs prior  c-met, lipids, A1c, TSH, PSA medicare, microalbumin, CBC

## 2015-06-25 NOTE — Patient Instructions (Addendum)
Please try and cut back on the salt in your diet.  This will help keep your blood pressure lower (so more medication isn't needed). It is important that you take the coreg TWICE DAILY--start taking one at bedtime (since your daytime dose is around noon). Please try and check your blood pressure and keep a record of it--use columns for the date, morning, evening and comments. You don't need to check it twice daily, but put the number in the appropriate column.  Use the comment section to put in how you feel (ie headache, dizzy), if you missed your medication, if you ate something out of the ordinary (ie high after chinese food). Bring both your monitor and this record to your next visit in May. Return soon (with monitor and record) if your blood pressure is consistently >140/90.  Please moisturize after your showers.

## 2015-09-15 DIAGNOSIS — H52223 Regular astigmatism, bilateral: Secondary | ICD-10-CM | POA: Diagnosis not present

## 2015-10-22 ENCOUNTER — Other Ambulatory Visit: Payer: Self-pay | Admitting: Family Medicine

## 2015-11-04 ENCOUNTER — Other Ambulatory Visit: Payer: Self-pay | Admitting: Family Medicine

## 2015-11-24 ENCOUNTER — Ambulatory Visit (INDEPENDENT_AMBULATORY_CARE_PROVIDER_SITE_OTHER): Payer: Medicare Other | Admitting: Family Medicine

## 2015-11-24 ENCOUNTER — Encounter: Payer: Self-pay | Admitting: Family Medicine

## 2015-11-24 VITALS — BP 140/80 | HR 80 | Temp 97.3°F | Ht 68.5 in | Wt 192.0 lb

## 2015-11-24 DIAGNOSIS — J309 Allergic rhinitis, unspecified: Secondary | ICD-10-CM | POA: Diagnosis not present

## 2015-11-24 DIAGNOSIS — R05 Cough: Secondary | ICD-10-CM

## 2015-11-24 DIAGNOSIS — R059 Cough, unspecified: Secondary | ICD-10-CM

## 2015-11-24 MED ORDER — BENZONATATE 200 MG PO CAPS: 200.0000 mg | ORAL_CAPSULE | Freq: Three times a day (TID) | ORAL | Status: DC | PRN

## 2015-11-24 MED ORDER — AZITHROMYCIN 250 MG PO TABS: ORAL_TABLET | ORAL | Status: DC

## 2015-11-24 NOTE — Progress Notes (Signed)
Chief Complaint  Patient presents with  . Cough    always starts when allergy season starts. Coughing up "gray stuff," 3-4 days ago. He says every year this happens and he takes a zpak and it clears up. Cough is mostly during the day-not really coughing at night-feels like he is wheezing.    Started last month (or longer) with a runny nose.  As allergy season kicked in, his cough got worse.   He has been coughing more in the last 7-10 days when the pollen got worse. No fever, chills, no sick contact. He reports having this every year for the last 7-8 years, and always requires a z-pak.  Review of chart:  z-paks last prescribed 05/2014 and 12/2012.  He recalls that the last time it was prescribed, he didn't actually need to take it.  He is getting up small amounts of gray phlegm, thick, scant amount.  Cough is only during the day, rarely bothers him at night (if he wakes up to void, he coughs some, but not waking him up).   He has a h/o asthma, and this time notices that his breathing is affected some, slightly wheezy, mostly feeling in his throat, not his chest yet. He reports that he is coughing much more in the office today than he has been coughing at home.  He has been taking zyrtec daily (admits to sometimes taking a second one in the evening). He admits to only sporadic use of the Flonase.  PMH, PSH, SH reviewed.  Outpatient Encounter Prescriptions as of 11/24/2015  Medication Sig Note  . atorvastatin (LIPITOR) 10 MG tablet TAKE 1 TABLET ONCE DAILY.   . carvedilol (COREG) 25 MG tablet TAKE 1 TABLET TWICE DAILY.   . cetirizine (ZYRTEC) 10 MG tablet Take 10 mg by mouth as needed.    Marland Kitchen co-enzyme Q-10 30 MG capsule Take 30 mg by mouth 3 (three) times daily.   . fluticasone (FLONASE) 50 MCG/ACT nasal spray USE 2 SPRAYS IN EACH NOSTRIL ONCE DAILY 11/24/2015: Uses sporadically  . lisinopril (PRINIVIL,ZESTRIL) 10 MG tablet TAKE 1 TABLET ONCE DAILY.   . Multiple Vitamins-Minerals (MULTIVITAMIN  WITH MINERALS) tablet Take 1 tablet by mouth daily.   . Omega-3 Fatty Acids (FISH OIL) 1000 MG CAPS Take 1,000 mg by mouth daily.   Marland Kitchen azithromycin (ZITHROMAX) 250 MG tablet Take 2 tablets by mouth on first day, then 1 tablet by mouth on days 2 through 5   . benzonatate (TESSALON) 200 MG capsule Take 1 capsule (200 mg total) by mouth 3 (three) times daily as needed.    No facility-administered encounter medications on file as of 11/24/2015.   (zpak and tessalon rx'd today, not prior to visit).  Allergies  Allergen Reactions  . Iodine Other (See Comments)    Feels like he is "burning up"   ROS:   No fever, chills, headaches, dizziness, chest pain. Some ongoing eye problems since surgery, with swelling/"bags" below his eyes. No nausea, vomiting, diarrhea, urinary complaints. Denies myalgias.  PHYSICAL EXAM: BP 140/80 mmHg  Pulse 80  Temp(Src) 97.3 F (36.3 C) (Tympanic)  Ht 5' 8.5" (1.74 m)  Wt 192 lb (87.091 kg)  BMI 28.77 kg/m2 Well developed, pleasant male in good spirits. Frequent dry cough heard throughout visit.  No increased cough with forced expiration or deep breaths.  Speaking easily during visit HEENT: PERRL, EOMI, conjunctiva and sclera are clear.  No significant soft tissue swelling around eye noted. Nasal mucosa is pale, mildly edematous, no  erythema or purulence. Sinuses nontender. OP is clear TM's and EAC's normal. Neck: no lymphadenopathy or mass Heart: regular rate and rhythm Lungs: clear bilaterally. No wheezes, rales, ronchi Skin: normal turgor, no rash Neuro: alert and oriented, some facial twitching on the right, unchanged. Normal strength, gait Psych: normal mood, affect, hygiene and grooming  ASSESSMENT/PLAN:  Allergic rhinitis, unspecified allergic rhinitis type  Cough - Plan: benzonatate (TESSALON) 200 MG capsule, azithromycin (ZITHROMAX) 250 MG tablet    Allergies: Use the Flonase every day (2 sprays into each nostril--if your allergies seem better,  you can decrease just to 1 spray in each nostril, rather than stopping using it, especially if still during allergy season).   Use gentle sniffs (don't inhale so hard that you swallow/taste it).   Don't take the zyrtec more than once daily.  Take mucinex (I recommend the 12 hour version) regularly--you can use the plain, or the DM version.  The DM has dextromethorphan, which is a cough suppressant, also found in Delsym Syrup.  I recommend getting the PLAIN Mucinex (not the DM), and a separate Delsym syrup, that you can use during the day, and skip at night, if you aren't coughing at night, but still take the mucinex twice daily.   Use the benzonatate as needed for cough (along with the mucinex).  Start the antibiotic only if you notice worsening cough, discolored mucus or phlegm. If you have significant shortness of breath or wheezing develop, please return for evaluation.

## 2015-11-24 NOTE — Patient Instructions (Signed)
   Allergies: Use the Flonase every day (2 sprays into each nostril--if your allergies seem better, you can decrease just to 1 spray in each nostril, rather than stopping using it, especially if still during allergy season).   Use gentle sniffs (don't inhale so hard that you swallow/taste it).   Don't take the zyrtec more than once daily.  Take mucinex (I recommend the 12 hour version) regularly--you can use the plain, or the DM version.  The DM has dextromethorphan, which is a cough suppressant, also found in Delsym Syrup.  I recommend getting the PLAIN Mucinex (not the DM), and a separate Delsym syrup, that you can use during the day, and skip at night, if you aren't coughing at night, but still take the mucinex twice daily.   Use the benzonatate as needed for cough (along with the mucinex).  Start the antibiotic only if you notice worsening cough, discolored mucus or phlegm. If you have significant shortness of breath or wheezing develop, please return for evaluation.

## 2015-12-07 ENCOUNTER — Other Ambulatory Visit: Payer: Self-pay | Admitting: Family Medicine

## 2015-12-22 ENCOUNTER — Ambulatory Visit (INDEPENDENT_AMBULATORY_CARE_PROVIDER_SITE_OTHER): Payer: Medicare Other | Admitting: Family Medicine

## 2015-12-22 ENCOUNTER — Encounter: Payer: Self-pay | Admitting: Family Medicine

## 2015-12-22 ENCOUNTER — Ambulatory Visit
Admission: RE | Admit: 2015-12-22 | Discharge: 2015-12-22 | Disposition: A | Discharge status: Home / Self Care | Payer: Medicare Other | Source: Ambulatory Visit | Attending: Family Medicine | Admitting: Family Medicine

## 2015-12-22 VITALS — BP 152/74 | HR 60 | Temp 98.1°F | Ht 68.5 in | Wt 187.0 lb

## 2015-12-22 DIAGNOSIS — R05 Cough: Secondary | ICD-10-CM | POA: Diagnosis not present

## 2015-12-22 DIAGNOSIS — I10 Essential (primary) hypertension: Secondary | ICD-10-CM | POA: Diagnosis not present

## 2015-12-22 DIAGNOSIS — J449 Chronic obstructive pulmonary disease, unspecified: Secondary | ICD-10-CM | POA: Diagnosis not present

## 2015-12-22 DIAGNOSIS — R059 Cough, unspecified: Secondary | ICD-10-CM

## 2015-12-22 DIAGNOSIS — J309 Allergic rhinitis, unspecified: Secondary | ICD-10-CM

## 2015-12-22 MED ORDER — ALBUTEROL SULFATE (2.5 MG/3ML) 0.083% IN NEBU
2.5000 mg | INHALATION_SOLUTION | Freq: Once | RESPIRATORY_TRACT | Status: AC
  Administered 2015-12-22: 2.5 mg via RESPIRATORY_TRACT

## 2015-12-22 MED ORDER — LOSARTAN POTASSIUM 50 MG PO TABS: 50.0000 mg | ORAL_TABLET | Freq: Every day | ORAL | Status: DC

## 2015-12-22 MED ORDER — PREDNISONE 20 MG PO TABS: 20.0000 mg | ORAL_TABLET | Freq: Two times a day (BID) | ORAL | Status: DC

## 2015-12-22 NOTE — Patient Instructions (Addendum)
Use the Flonase every day.  Please try and remember to take your Carvedilol TWICE DAILY--morning and evening.  Try and develop a routine to remember this better (ie take when brushing your teeth--keep in the bathroom next to your toothbrush).  Go to [facility] ([address]) for chest x-ray.  You don't need any paperwork, order is in the computer, and no appointment is needed.  Stop the lisinopril. Change to losartan 50mg  once daily in place of the lisinopril.  This also helps protect the kidney from diabetes, but is much less likely to cause any cough. If your blood pressure drops to <110/50, then cut the dose in half, and stay on just 1/2 tablet daily.  Your lung tests showed obstructive airway disease.  Go for the x-ray.  If otherwise normal (just COPD changes), then we can try a short course of prednisone to see if that helps with your cough.  If it helps only temporarily, then the next step would be treating the underlying breathing problem (ie with daily inhalers) Try and check blood pressure periodically, keep a list and bring it to a follow up appointment in 2-3 weeks.

## 2015-12-22 NOTE — Progress Notes (Signed)
Chief Complaint  Patient presents with  . Cough    seen mid April for the same thing, did take zpak, thought it was helping but it really didn't . Not coughing up too much stuff. Once he gets up in the morning and starts moving around, cough starts. Has coughing fits, and then it stops. Really just wants cough to go away. Lots of clearing of the throat. Hot coffee helps when he has a coughing fit. Does get a little hard to breathe through his nose when he is coughing.     Seen a month ago with complaint of runny nose and cough, related to when the pollen got worse. He was coughing up small amounts of thick, gray phlegm at that time.  He had been taking zyrtec daily, and flonase sporadically.  Exam was consistent with allergies, and he was instructed to use Flonase daily, in addition to the zyrtec, and guaifenesin; he was given rx for tessalon and a zpak to use if worsening cough, discolored mucus.  He took the z-pak due to the gray color, doesn't believe it ever turned green.  It helped some, but the cough didn't completely resolve.  He feels like the cough is related to sinus drainage.  He doesn't cough much phlegm up, what he gets up is now clear.  Doesn't feel like he is wheezing (had asthma as a kid). He is constantly clearing his throat, sometimes triggers a coughing jag. Feels better after coughing up a very small amount of phlegm.  He only took flonase daily for 3-4 days (stopped when he started the antibiotic).  He still only uses it intermittently. He no longer is complaining of any runny nose (like he had at last visit).  He took mucinex for 8-10 days, didn't notice a different so stopped it.  He also stopped taking fish oil, vitamin and coenzyme Q10--nothing made a difference, but he hasn't restarted these yet. He continues to take his BP meds.  Given that his BP was elevated today, we reviewed his BP meds.  He reports that his home BP's have been running [id number] systolic at home  recently, usually is in the 140's. He mentioned thinking he was on the CR of the Coreg, so has only been taking it once daily, not BID.  He then reported periodically taking a second pill in the evening.  He takes all his pills together at one time, usually in the mid afternoon or early evening, not really a consistent time.  Marland Kitchen  He states that he took norvasc and coreg CR for years with good control of BP. He couldn't recall the details, but upon reviewing the dates in epic from the prescriptions, and earlier visits, we were able to piece together that amlodipine was stopped in 08/2013, due to leg swelling. Lisinopril was started 11/2013 (due to diabetes).  PMH, PSH, SH reviewed Distant smoking history, quit in [place] Encounter Prescriptions as of 12/22/2015  Medication Sig Note  . atorvastatin (LIPITOR) 10 MG tablet TAKE 1 TABLET ONCE DAILY.   . benzonatate (TESSALON) 200 MG capsule Take 1 capsule (200 mg total) by mouth 3 (three) times daily as needed. 12/22/2015: On and off  . carvedilol (COREG) 25 MG tablet TAKE 1 TABLET TWICE DAILY. 12/22/2015: Only taking it once daily  . fluticasone (FLONASE) 50 MCG/ACT nasal spray USE 2 SPRAYS IN EACH NOSTRIL ONCE DAILY 11/24/2015: Uses sporadically  . lisinopril (PRINIVIL,ZESTRIL) 10 MG tablet TAKE 1 TABLET ONCE DAILY.   Marland Kitchen  cetirizine (ZYRTEC) 10 MG tablet Take 10 mg by mouth as needed. Reported on 12/22/2015   . co-enzyme Q-10 30 MG capsule Take 30 mg by mouth 3 (three) times daily. Reported on 12/22/2015   . Multiple Vitamins-Minerals (MULTIVITAMIN WITH MINERALS) tablet Take 1 tablet by mouth daily. Reported on 12/22/2015   . Omega-3 Fatty Acids (FISH OIL) 1000 MG CAPS Take 1,000 mg by mouth daily. Reported on 12/22/2015   . [DISCONTINUED] azithromycin (ZITHROMAX) 250 MG tablet Take 2 tablets by mouth on first day, then 1 tablet by mouth on days 2 through 5    No facility-administered encounter medications on file as of 12/22/2015.   He is only  taking BP med and lipitor regularly currently.  Allergies  Allergen Reactions  . Iodine Other (See Comments)    Feels like he is "burning up"   ROS: no fever, chills, headaches, dizziness, chest pain, ear pain, sore throat, shortness of breath, GI complaints, bleeding, bruising, rash, edema or other complaints.  See HPI.  PHYSICAL EXAM: BP 182/80 mmHg  Pulse 60  Temp(Src) 98.1 F (36.7 C) (Tympanic)  Ht 5' 8.5" (1.74 m)  Wt 187 lb (84.823 kg)  BMI 28.02 kg/m2 152/74 on repeat  Well appearing male, with occasional throat clearing and dry cough, in no distress, speaking easily, in good spirits HEENT: PERRL, EOMI, conjunctiva and sclera are clear.  Nasal mucosa is mildly edematous, pale. No purulence or erythema. Sinuses nontender. OP is clear Neck: no lymphadenopathy, thyromegaly or mass Heart: regular rate and rhythm Lungs: fair air movement.  Some upper airway noise heard with forced expiration. Trace wheezing noted on the right. Cleared after nebulizer. No rales, ronchi Extremities: no edema, 2+ pulses Psych: normal mood, affect, hygiene and grooming Neuro: twitching R face unchanged.  Normal strength, gait Skin: normal turgor, no rash   Spirometry (pre and post if abnormal to start)--shows significant obstruction, with some improvement after albuterol nebulizer treatment.  ASSESSMENT/PLAN:  Cough - given length of sx, check CXR to look for underlying abnl. suspect related to PND, RAD, poss COPD. d/c ACEI - Plan: DG Chest 2 View, Spirometry: Pre & Post Eval, albuterol (PROVENTIL) (2.5 MG/3ML) 0.083% nebulizer solution 2.5 mg, CANCELED: Spirometry: Pre & Post Eval, CANCELED: Spirometry with Graph, CANCELED: Spirometry with Graph  Essential hypertension, benign - not well controlled, possibly related to qd dosing of coreg. change ACEI to ARB, take coreg BID. f/u 2-3 wks - Plan: losartan (COZAAR) 50 MG tablet  Allergic rhinitis, unspecified allergic rhinitis type - encouraged to  use Flonase daily; continue antihistamine; mucinex prn.  If CXR negative, short course of steroids  Chronic obstructive pulmonary disease, unspecified COPD type ([place])    Use the Flonase every day.  Please try and remember to take your Carvedilol TWICE DAILY--morning and evening.  Try and develop a routine to remember this better (ie take when brushing your teeth--keep in the bathroom next to your toothbrush).  Go to [facility] ([address]) for chest x-ray.  You don't need any paperwork, order is in the computer, and no appointment is needed.  Stop the lisinopril. Change to losartan 50mg  once daily in place of the lisinopril.  This also helps protect the kidney from diabetes, but is much less likely to cause any cough. If your blood pressure drops to <110/50, then cut the dose in half, and stay on just 1/2 tablet daily.  Your lung tests showed obstructive airway disease.  Go for the x-ray.  If otherwise  normal (just COPD changes), then we can try a short course of prednisone to see if that helps with your cough.  If it helps only temporarily, then the next step would be treating the underlying breathing problem (ie with daily inhalers)  Try and check blood pressure periodically, keep a list and bring it to a follow up appointment in 2-3 weeks.

## 2016-01-06 ENCOUNTER — Other Ambulatory Visit: Payer: Medicare Other

## 2016-01-06 ENCOUNTER — Other Ambulatory Visit: Payer: Self-pay | Admitting: Family Medicine

## 2016-01-06 DIAGNOSIS — E538 Deficiency of other specified B group vitamins: Secondary | ICD-10-CM | POA: Diagnosis not present

## 2016-01-06 DIAGNOSIS — I10 Essential (primary) hypertension: Secondary | ICD-10-CM | POA: Diagnosis not present

## 2016-01-06 DIAGNOSIS — Z125 Encounter for screening for malignant neoplasm of prostate: Secondary | ICD-10-CM

## 2016-01-06 DIAGNOSIS — Z5181 Encounter for therapeutic drug level monitoring: Secondary | ICD-10-CM | POA: Diagnosis not present

## 2016-01-06 DIAGNOSIS — D649 Anemia, unspecified: Secondary | ICD-10-CM | POA: Diagnosis not present

## 2016-01-06 DIAGNOSIS — E119 Type 2 diabetes mellitus without complications: Secondary | ICD-10-CM

## 2016-01-06 DIAGNOSIS — E7800 Pure hypercholesterolemia, unspecified: Secondary | ICD-10-CM

## 2016-01-06 LAB — COMPREHENSIVE METABOLIC PANEL
ALT: 22 U/L (ref 9–46)
AST: 23 U/L (ref 10–35)
Albumin: 3.9 g/dL (ref 3.6–5.1)
Alkaline Phosphatase: 57 U/L (ref 40–115)
BUN: 21 mg/dL (ref 7–25)
CALCIUM: 8.5 mg/dL — AB (ref 8.6–10.3)
CHLORIDE: 105 mmol/L (ref 98–110)
CO2: 27 mmol/L (ref 20–31)
Creat: 1.07 mg/dL (ref 0.70–1.11)
GLUCOSE: 112 mg/dL — AB (ref 65–99)
POTASSIUM: 4.7 mmol/L (ref 3.5–5.3)
Sodium: 139 mmol/L (ref 135–146)
TOTAL PROTEIN: 6.1 g/dL (ref 6.1–8.1)
Total Bilirubin: 0.6 mg/dL (ref 0.2–1.2)

## 2016-01-06 LAB — CBC WITH DIFFERENTIAL/PLATELET
BASOS PCT: 1 %
Basophils Absolute: 70 cells/uL (ref 0–200)
EOS ABS: 630 {cells}/uL — AB (ref 15–500)
Eosinophils Relative: 9 %
HEMATOCRIT: 37.3 % — AB (ref 38.5–50.0)
Hemoglobin: 12.3 g/dL — ABNORMAL LOW (ref 13.2–17.1)
LYMPHS PCT: 17 %
Lymphs Abs: 1190 cells/uL (ref 850–3900)
MCH: 32.5 pg (ref 27.0–33.0)
MCHC: 33 g/dL (ref 32.0–36.0)
MCV: 98.4 fL (ref 80.0–100.0)
MONO ABS: 1050 {cells}/uL — AB (ref 200–950)
MONOS PCT: 15 %
MPV: 8.7 fL (ref 7.5–12.5)
NEUTROS ABS: 4060 {cells}/uL (ref 1500–7800)
Neutrophils Relative %: 58 %
PLATELETS: 188 10*3/uL (ref 140–400)
RBC: 3.79 MIL/uL — AB (ref 4.20–5.80)
RDW: 13.3 % (ref 11.0–15.0)
WBC: 7 10*3/uL (ref 4.0–10.5)

## 2016-01-06 LAB — HEMOGLOBIN A1C
Hgb A1c MFr Bld: 6.9 % — ABNORMAL HIGH (ref <5.7)
Mean Plasma Glucose: 151 mg/dL

## 2016-01-06 LAB — TSH: TSH: 3.76 mIU/L (ref 0.40–4.50)

## 2016-01-06 LAB — LIPID PANEL
CHOL/HDL RATIO: 2.8 ratio (ref <=5.0)
Cholesterol: 147 mg/dL (ref 125–200)
HDL: 53 mg/dL (ref >=40)
LDL CALC: 73 mg/dL (ref <130)
TRIGLYCERIDES: 106 mg/dL (ref <150)
VLDL: 21 mg/dL (ref <30)

## 2016-01-07 ENCOUNTER — Encounter: Payer: Self-pay | Admitting: Family Medicine

## 2016-01-07 ENCOUNTER — Ambulatory Visit (INDEPENDENT_AMBULATORY_CARE_PROVIDER_SITE_OTHER): Payer: Medicare Other | Admitting: Family Medicine

## 2016-01-07 VITALS — BP 124/64 | HR 72 | Ht 68.5 in | Wt 190.0 lb

## 2016-01-07 DIAGNOSIS — E119 Type 2 diabetes mellitus without complications: Secondary | ICD-10-CM | POA: Diagnosis not present

## 2016-01-07 DIAGNOSIS — D649 Anemia, unspecified: Secondary | ICD-10-CM

## 2016-01-07 DIAGNOSIS — E7800 Pure hypercholesterolemia, unspecified: Secondary | ICD-10-CM | POA: Diagnosis not present

## 2016-01-07 DIAGNOSIS — I10 Essential (primary) hypertension: Secondary | ICD-10-CM | POA: Diagnosis not present

## 2016-01-07 DIAGNOSIS — Z0000 Encounter for general adult medical examination without abnormal findings: Secondary | ICD-10-CM | POA: Diagnosis not present

## 2016-01-07 DIAGNOSIS — R972 Elevated prostate specific antigen [PSA]: Secondary | ICD-10-CM | POA: Diagnosis not present

## 2016-01-07 LAB — POCT URINALYSIS DIPSTICK
Blood, UA: NEGATIVE
Glucose, UA: NEGATIVE
Ketones, UA: NEGATIVE
Leukocytes, UA: NEGATIVE
Nitrite, UA: NEGATIVE
Protein, UA: NEGATIVE
Spec Grav, UA: >=1.03
Urobilinogen, UA: NEGATIVE
pH, UA: 5.5

## 2016-01-07 LAB — PSA, MEDICARE: PSA: 3.82 ng/mL (ref <=4.00)

## 2016-01-07 LAB — MICROALBUMIN / CREATININE URINE RATIO
CREATININE, URINE: 147 mg/dL (ref 20–370)
MICROALB UR: 0.5 mg/dL
MICROALB/CREAT RATIO: 3 ug/mg{creat} (ref <30)

## 2016-01-07 NOTE — Progress Notes (Signed)
Chief Complaint  Patient presents with  . Medicare Wellness    nonfasting annual exam, labs already done. Only complaint is that he stil has his nagging cough. Did not do eye exam today as he is seeing Dr.Tanner tomorrow.    Peter [name] is a 82 y.o. male who presents for annual physical, wellness visit and follow-up on chronic medical conditions.  He has the following concerns:  Hypertension: he was changed from ACE to ARB due to cough at last visit 2 weeks ago.  His BP's had been high, but had been taking coreg only once daily prior to last visit.  He is now taking losartan 25 mg daily along with taking coreg BID. He was supposed to start at 56m, and cut it in 1/2 only if BPs were low, but he admits that he only started at 23m and never increased it. BP's have been running 124-155/53-68. Pulse 61-82  Denies dizziness (some lightheadedness only if he stands too quickly--he admits he doesn't drink any water, and drinks a lot of caffeine--this is chronic and unchanged), headaches, chest pain. Denies side effects of medications.  He has cut back some on the salt from his usual.  Cut back on pretzels (but still eats them sometimes with olives).  He is a year past due in seeing Dr. NaAcie Fredricksonor CAD.  He has appointment scheduled for July. He is no longer having any chest discomfort (rare, much improved, he thinks related to hiatal hernia--3 Tums resolves any symptoms he has).  Allergies:  He has been using Flonase regularly since his last visit. He was given a course of steroids (prednisone 2098mID x 5 days) after CXR negative to see if it helped with his cough. It seemed to help, but reports that the cough never completely went away. He has some good days, and other days that he coughs more.  It seems the days he is more active, he has more cough (up moving around more, seems to cough more).  Once he can cough up sputum, he feels better.  He has tried using sudafed with good results.   Spirometry showed obstructive airway disease; CXR didn't show COPD changes.  Steroids were also felt to potentially help with allergic symptoms.  Hyperlipidemia follow-up: Compliant with medications and denies medication side effects.   Diabetes: He has never taken medications for diabetes.Denies polydipsia, polyuria. Doesn't really watch his diet much at all. He has been eating less candy, doesn't eat dessert.  Goes to ophtho regularly. Had cataract surgery-he has some ongoing issues with his eye (related to the ducts). Eyes feel watery.He has upcoming appointment.  May have blepharoplasty, and is also considering Botox for the right eye twitching.   Immunization History  Administered Date(s) Administered  . Influenza Split 05/06/2011, 05/25/2012  . Influenza, High Dose Seasonal PF 05/21/2013, 05/22/2014, 06/25/2015  . Pneumococcal Conjugate-13 11/28/2013  . Pneumococcal Polysaccharide-23 08/09/2006  . Tdap 05/06/2011  . Zoster 06/01/2012   Last colonoscopy: 07/2013, Dr. EdwOletta Lamasotified he was due for stool test, they are mailing to him Last PSA: recent Dentist: twice yearly  Ophtho: yearly, more often since cataract surgery last year Exercise: "not enough". Active with work around the house, and walks occasionally with wife. Limited by muscle fatigue in his leg and his buttock.  Intermittently he can walk around (on vacation, trips) without problems   He has a living will and healthcare power of attorney See depression and ADL screening questionnaires in computer. Known stable hearing loss).  Other doctors caring for patient include: Sports Med: [name] Neurology: Dr. Brett Fairy (saw once for possible botox injection) Cardiology: Dr. Acie Fredrickson Ophtho: Dr. Claudean Kinds, [name] Dermatology: Dr. Elvera Lennox GI: [name]  Past Medical History  Diagnosis Date  . Hypertension age 57  . Diabetes mellitus diet controlled  . Hiatal hernia   . Hypercholesteremia   .  FHx: colon cancer   . Asthma h/o as a child-s/p immunotherapy  . Adenomatous colon polyp 07/2013  . Internal hemorrhoids 07/2013    noted on colonoscopy  . 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 Restlessness and agitation 08/13/2014    Past Surgical History  Procedure Laterality Date  . Cardiovascular stress test  normal  . Appendectomy    . Vasectomy    . Colonoscopy  3/08, 07/2013    no repeat rec due to age per [name]  . Cataract extraction, bilateral  07/2014, 08/2014    Social History   Social History  . Marital Status: Married    Spouse Name: N/A  . Number of Children: 8  . Years of Education: N/A   Occupational History  . 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 Smoking status: Former Smoker    Quit date: 08/09/1974  . Smokeless tobacco: Never Used  . Alcohol Use: 0.0 oz/week    0 Standard drinks or equivalent per week     Comment: 1-2 drinks daily, sometimes none  . Drug Use: No  . Sexual Activity:    Partners: Female   Other Topics Concern  . Not on file   Social History Narrative   Lives with wife, dog.  Children live in [place], [place], [place], [place], and 2 daughters here in [place], [place]. 15 grandchildren, 5 great-grandchildren   Patient is right handed.  And drinks caffeine daily.    Family History  Problem Relation Age of Onset  . Alcohol abuse Father   . Cancer Sister     stomach/?pancreatic  . Colon cancer Brother 81  . Cancer Brother     colon cancer in mid 3's  . Arthritis Daughter     psoriatic  . Hyperlipidemia Son   . Stroke Neg Hx   . Heart disease Neg Hx   . Arthritis Daughter     psoriatic  . Hyperlipidemia Son   . Diabetes Maternal Grandmother     Outpatient Encounter Prescriptions as of 01/07/2016  Medication Sig Note  . atorvastatin (LIPITOR) 10 MG tablet TAKE 1 TABLET ONCE DAILY.   . carvedilol (COREG) 25 MG tablet TAKE 1 TABLET TWICE DAILY. 12/22/2015: Only taking it once daily  . co-enzyme Q-10 30 MG  capsule Take 30 mg by mouth 3 (three) times daily. Reported on 12/22/2015   . fluticasone (FLONASE) 50 MCG/ACT nasal spray USE 2 SPRAYS IN EACH NOSTRIL ONCE DAILY 11/24/2015: Uses sporadically  . losartan (COZAAR) 50 MG tablet Take 1 tablet (50 mg total) by mouth daily.   . Multiple Vitamins-Minerals (MULTIVITAMIN WITH MINERALS) tablet Take 1 tablet by mouth daily. Reported on 12/22/2015   . Omega-3 Fatty Acids (FISH OIL) 1000 MG CAPS Take 1,000 mg by mouth daily. Reported on 12/22/2015   . Pseudoephedrine-DM-GG-APAP (SUDAFED COLD/COUGH PO) Take 2 tablets by mouth as needed.   . [DISCONTINUED] lisinopril (PRINIVIL,ZESTRIL) 10 MG tablet  01/07/2016: Received from: External Pharmacy  . [DISCONTINUED] benzonatate (TESSALON) 200 MG capsule Take 1 capsule (200 mg total) by mouth 3 (three) times daily as needed. 12/22/2015: On and  off  . [DISCONTINUED] cetirizine (ZYRTEC) 10 MG tablet Take 10 mg by mouth as needed. Reported on 12/22/2015   . [DISCONTINUED] predniSONE (DELTASONE) 20 MG tablet Take 1 tablet (20 mg total) by mouth 2 (two) times daily with a meal.    No facility-administered encounter medications on file as of 01/07/2016.    Allergies  Allergen Reactions  . Iodine Other (See Comments)    Feels like he is "burning up"    ROS: The patient denies anorexia, fever, headaches, ear pain, hoarseness, exertional chest pain (just occasional tightness after walking after eating; intermittent and relieved by Tums), palpitations, syncope, dyspnea on exertion, swelling, nausea, vomiting, diarrhea, constipation, abdominal pain, melena, hematochezia, hematuria, incontinence, weakened urine stream, dysuria, genital lesions, numbness, tingling, weakness, tremor, suspicious skin lesions, depression, anxiety, abnormal bleeding/bruising, or enlarged lymph nodes. No insomnia +stable hearing loss.  +chronic cough as per HPI Some indigestion after spicy foods and tomato sauce, relieved by Tums (2-3 times/week, takes  before bedtime) Up 2 times/night to void (declines medications, previously offered by [name]); falls right back to sleep +ED, mild, overall is fine, doesn't need medication. Sees dermatologist regularly (twice yearly) Intermittent muscle fatigue in his leg and buttock with exertion, but sporadic--able to walk all around when traveling without problems.    PHYSICAL EXAM:  BP 124/64 mmHg  Pulse 72  Ht 5' 8.5" (1.74 m)  Wt 190 lb (86.183 kg)  BMI 28.47 kg/m2  General Appearance:  Alert, cooperative, no distress, appears stated age. Intermittent twitching of right eye/face  Head:  Normocephalic, without obvious abnormality, atraumatic   Eyes:  PERRL, conjunctiva/corneas clear, EOM's intact, fundi  benign   Ears:  Normal TM's and external ear canals   Nose:  Nares normal, mucosa mildly edematous, no erythema or purulence, no sinus tenderness   Throat:  Lips, mucosa, and tongue normal; teeth and gums normal. Small amount of white mucus noted in posterior OP on right  Neck:  Supple, no lymphadenopathy; thyroid: no enlargement/tenderness/nodules; no carotid  bruit or JVD   Back:  Spine nontender, no curvature, ROM normal, no CVA tenderness. nontender at SI joints and sciatic notch  Lungs:  Clear to auscultation bilaterally without wheezes, rales or ronchi; respirations unlabored   Chest Wall:  No tenderness or deformity   Heart:  Regular rate and rhythm, S1 and S2 normal, no murmur, rub  or gallop   Breast Exam:  No chest wall tenderness, masses or gynecomastia   Abdomen:  Soft, non-tender, nondistended, normoactive bowel sounds,  no masses, no hepatosplenomegaly   Genitalia:  Normal male external genitalia without lesions. Testicles without masses. No inguinal hernias.   Rectal:  Normal sphincter tone, no masses or tenderness; guaiac negative stool. Prostate smooth, no nodules, not enlarged.   Extremities:  No clubbing,  cyanosis or edema; notmal monofilament exam.  Pulses:  2+ and symmetric all extremities   Skin:  Skin color, texture, turgor normal, no rashes or lesions. Dry skin on legs  Lymph nodes:  Cervical, supraclavicular, and axillary nodes normal   Neurologic:  CNII-XII intact, normal strength, sensation and gait; reflexes 1+ and symmetric throughout. Negative SLR   Psych: Normal mood, affect, hygiene and grooming             Urine dip: SG >= 1.030  Lab Results  Component Value Date   HGBA1C 6.9* 01/06/2016   Lab Results  Component Value Date   WBC 7.0 01/06/2016   HGB 12.3* 01/06/2016   HCT 37.3* 01/06/2016  MCV 98.4 01/06/2016   PLT 188 01/06/2016   Lab Results  Component Value Date   CHOL 147 01/06/2016   HDL 53 01/06/2016   LDLCALC 73 01/06/2016   TRIG 106 01/06/2016   CHOLHDL 2.8 01/06/2016   Lab Results  Component Value Date   PSA 3.82 01/06/2016   PSA 2.70 12/18/2014   PSA 2.58 11/28/2013   Lab Results  Component Value Date   TSH 3.76 01/06/2016   Normal urine microalbumin    Chemistry      Component Value Date/Time   NA 139 01/06/2016 0727   NA 141 08/13/2014 1517   K 4.7 01/06/2016 0727   CL 105 01/06/2016 0727   CO2 27 01/06/2016 0727   BUN 21 01/06/2016 0727   BUN 22 08/13/2014 1517   CREATININE 1.07 01/06/2016 0727   CREATININE 1.08 08/13/2014 1517      Component Value Date/Time   CALCIUM 8.5* 01/06/2016 0727   ALKPHOS 57 01/06/2016 0727   AST 23 01/06/2016 0727   ALT 22 01/06/2016 0727   BILITOT 0.6 01/06/2016 0727     Fasting glucose 112   ASSESSMENT/PLAN:  Annual physical exam - Plan: POCT Urinalysis Dipstick  Increased prostate specific antigen (PSA) velocity - discussed; recommend recheck in 6 months  Type 2 diabetes mellitus without complication, without long-term current use of insulin (HCC) - increase in A1c; perhaps related to recent steroid use; diet, exercise reviewed. recheck A1c in  3 mos  Essential hypertension, benign - improved control; continue curretn regimen  Pure hypercholesterolemia - at goal; continue current regimen  Anemia, unspecified anemia type - mild, new onset.  add on B12, folate, iron and ferritin to recent labs  Encounter for Medicare annual wellness exam   Discussed PSA screening (risks/benefits), recommended at least 30 minutes of aerobic activity at least 5 days/week, weight-bearing exercise at least 2x/week; proper sunscreen use reviewed; healthy diet and alcohol recommendations (less than or equal to 2 drinks/day) reviewed; regular seatbelt use; changing batteries in smoke detectors. Self-testicular exams. Immunization recommendations discussed--continue yearly flu shots.  Colonoscopy recommendations reviewed--will forward GI CBC, showing new mild anemia.  Cough--trial of Omeprazole x 2 weeks.  If not better, then call for Spiriva sample.  If not better, refer to Pulmonary for evaluation. There may also be a sinus component, since sudafed helped.  Avoid sudafed due to blood pressure. Can try coricidin HBP and sinus rinses (sinus rinse kit or Neti-pot).  Reflux precautions reviewed.  Increased PSA velocity--recheck in 6 months   HTN--BP's are higher at home (?accuracy of cuff); at goal here.  Continue 1/2 tablet of losartan.  Continue the twice daily coreg.  A1c may have been affected by the 5d course of prednisone recently. Continue proper diet, encouraged regular exercise,  Repeat A1c in 3-4 months.  New anemia.  Just got notified from [name] that he is due for stool tests.  Unsure which kind this may be (?Cologard).  Send copies of labs to [name]. Add on B12, folate, ferritin and iron.  Return in 3 months. A1c at the visit Possibly repeat CBC  Asked to drop off copy of Living Will and healthcare power of attorney. Reviewed that he is Full Code, Full Care. Doesn't want prolonged measures   Medicare Attestation I have  personally reviewed: The patient's medical and social history Their use of alcohol, tobacco or illicit drugs Their current medications and supplements The patient's functional ability including ADLs,fall risks, home safety risks, cognitive, and hearing and  visual impairment Diet and physical activities Evidence for depression or mood disorders  The patient's weight, height, and BMI have been recorded in the chart.  I have made referrals, counseling, and provided education to the patient based on review of the above and I have provided the patient with a written personalized care plan for preventive services.     Julanne Schlueter A, MD   01/07/2016

## 2016-01-07 NOTE — Patient Instructions (Addendum)
HEALTH MAINTENANCE RECOMMENDATIONS:  It is recommended that you get at least 30 minutes of aerobic exercise at least 5 days/week (for weight loss, you may need as much as 60-90 minutes). This can be any activity that gets your heart rate up. This can be divided in 10-15 minute intervals if needed, but try and build up your endurance at least once a week.  Weight bearing exercise is also recommended twice weekly.  Eat a healthy diet with lots of vegetables, fruits and fiber.  "Colorful" foods have a lot of vitamins (ie green vegetables, tomatoes, red peppers, etc).  Limit sweet tea, regular sodas and alcoholic beverages, all of which has a lot of calories and sugar.  Up to 2 alcoholic drinks daily may be beneficial for men (unless trying to lose weight, watch sugars).  Drink a lot of water.  Sunscreen of at least SPF 30 should be used on all sun-exposed parts of the skin when outside between the hours of 10 am and 4 pm (not just when at beach or pool, but even with exercise, golf, tennis, and yard work!)  Use a sunscreen that says "broad spectrum" so it covers both UVA and UVB rays, and make sure to reapply every 1-2 hours.  Remember to change the batteries in your smoke detectors when changing your clock times in the spring and fall.  Use your seat belt every time you are in a car, and please drive safely and not be distracted with cell phones and texting while driving.  Take prilosec OTC once daily for 2 weeks.  If this (along with the dietary changes we discussed) doesn't help the cough, then call us so we can give you Spiriva samples to try. You can also try Coricidin HBP, since the sudafed helped, but you aren't supposed to take sudafed because it raises your blood pressure.  Continue the losartan at 1/2 tablet daily and coreg twice daily.   Food Choices for Gastroesophageal Reflux Disease, Adult When you have gastroesophageal reflux disease (GERD), the foods you eat and your eating habits  are very important. Choosing the right foods can help ease the discomfort of GERD. WHAT GENERAL GUIDELINES DO I NEED TO FOLLOW?  Choose fruits, vegetables, whole grains, low-fat dairy products, and low-fat meat, fish, and poultry.  Limit fats such as oils, salad dressings, butter, nuts, and avocado.  Keep a food diary to identify foods that cause symptoms.  Avoid foods that cause reflux. These may be different for different people.  Eat frequent small meals instead of three large meals each day.  Eat your meals slowly, in a relaxed setting.  Limit fried foods.  Cook foods using methods other than frying.  Avoid drinking alcohol.  Avoid drinking large amounts of liquids with your meals.  Avoid bending over or lying down until 2-3 hours after eating. WHAT FOODS ARE NOT RECOMMENDED? The following are some foods and drinks that may worsen your symptoms: Vegetables Tomatoes. Tomato juice. Tomato and spaghetti sauce. Chili peppers. Onion and garlic. Horseradish. Fruits Oranges, grapefruit, and lemon (fruit and juice). Meats High-fat meats, fish, and poultry. This includes hot dogs, ribs, ham, sausage, salami, and bacon. Dairy Whole milk and chocolate milk. Sour cream. Cream. Butter. Ice cream. Cream cheese.  Beverages Coffee and tea, with or without caffeine. Carbonated beverages or energy drinks. Condiments Hot sauce. Barbecue sauce.  Sweets/Desserts Chocolate and cocoa. Donuts. Peppermint and spearmint. Fats and Oils High-fat foods, including [place] fries and potato chips. Other Vinegar. Strong spices, such  as black pepper, white pepper, red pepper, cayenne, curry powder, cloves, ginger, and chili powder. The items listed above may not be a complete list of foods and beverages to avoid. Contact your dietitian for more information.   This information is not intended to replace advice given to you by your health care provider. Make sure you discuss any questions you have with  your health care provider.   Document Released: 07/26/2005 Document Revised: 08/16/2014 Document Reviewed: 05/30/2013 Elsevier Interactive Patient Education 2016 [employer].    Peter [name] , Thank you for taking time to come for your Medicare Wellness Visit. I appreciate your ongoing commitment to your health goals. Please review the following plan we discussed and let me know if I can assist you in the future.   These are the goals we discussed: Goals    None      This is a list of the screening recommended for you and due dates:  Health Maintenance  Topic Date Due  . Eye exam for diabetics  05/22/2015  . Complete foot exam   12/18/2015  . Flu Shot  03/09/2016  . Hemoglobin A1C  07/08/2016  . Tetanus Vaccine  05/05/2021  . Shingles Vaccine  Completed  . Pneumonia vaccines  Completed   Make sure that you eye doctor is aware that you have diabetes, and that he sends me a report once yearly with the diabetic eye exam. Foot exam was done today.

## 2016-01-08 DIAGNOSIS — Z1211 Encounter for screening for malignant neoplasm of colon: Secondary | ICD-10-CM | POA: Diagnosis not present

## 2016-01-08 LAB — IRON: IRON: 87 ug/dL (ref 50–180)

## 2016-01-08 LAB — FERRITIN: FERRITIN: 102 ng/mL (ref 20–380)

## 2016-01-08 LAB — VITAMIN B12: VITAMIN B 12: 769 pg/mL (ref 200–1100)

## 2016-01-08 LAB — FOLATE: FOLATE: 19.3 ng/mL (ref >5.4)

## 2016-01-09 DIAGNOSIS — H02834 Dermatochalasis of left upper eyelid: Secondary | ICD-10-CM | POA: Diagnosis not present

## 2016-01-09 DIAGNOSIS — G513 Clonic hemifacial spasm: Secondary | ICD-10-CM | POA: Diagnosis not present

## 2016-01-09 DIAGNOSIS — H02831 Dermatochalasis of right upper eyelid: Secondary | ICD-10-CM | POA: Diagnosis not present

## 2016-01-12 ENCOUNTER — Other Ambulatory Visit: Payer: Self-pay | Admitting: Family Medicine

## 2016-01-16 DIAGNOSIS — Z1211 Encounter for screening for malignant neoplasm of colon: Secondary | ICD-10-CM | POA: Diagnosis not present

## 2016-01-26 ENCOUNTER — Ambulatory Visit (INDEPENDENT_AMBULATORY_CARE_PROVIDER_SITE_OTHER): Payer: Medicare Other | Admitting: Neurology

## 2016-01-26 ENCOUNTER — Encounter: Payer: Self-pay | Admitting: Neurology

## 2016-01-26 VITALS — BP 112/60 | HR 70 | Resp 20 | Ht 67.0 in | Wt 186.0 lb

## 2016-01-26 DIAGNOSIS — G518 Other disorders of facial nerve: Secondary | ICD-10-CM

## 2016-01-26 DIAGNOSIS — G5139 Clonic hemifacial spasm, unspecified: Secondary | ICD-10-CM

## 2016-01-26 MED ORDER — ALPRAZOLAM 0.5 MG PO TABS: 0.5000 mg | ORAL_TABLET | Freq: Every evening | ORAL | Status: DC | PRN

## 2016-01-26 NOTE — Addendum Note (Signed)
Addended by: Lester [place] A on: 01/26/2016 12:12 PM   Modules accepted: Orders

## 2016-01-26 NOTE — Patient Instructions (Signed)
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 He has seen ophthalmology and [name] at the [facility] ophthalmology center felt that he would not be a surgical candidate due to the variable degree of eye closure associated with his right facial spasms. His left side is not involved. He will be evaluated by an MRI and will have Xanax as a antianxiety medication for the procedure. In addition I have referred him for Botox evaluation. CD   Rv with Np or me.

## 2016-01-26 NOTE — Progress Notes (Signed)
Provider:  Larey Seat, M D  Referring Provider: Rita Ohara, MD Primary Care Physician:  Vikki Ports, MD  Chief Complaint  Patient presents with  . Follow-up    facial spasm, last saw Dr. Brett Fairy 08/13/2014, considering botox, rm 69, with wife    HPI:  Peter [name] is a 83 y.o. male seen here as a referral from Dr. Rita Ohara for facial twitching,now re visit after he initially declined Botox.    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 Onset about 18 month ago, unrelated to any surgery or trauma. He has the habit of drinking 1-3 caffeinated drinks a day , sees no correlation. He has had meanwhile, since onset , bilateral cataract surgery , which did not affect the twitching. He feels no stress or nervousity is a trigger. He doesn't have it every day. Sometimes it occurs in the middle of the night.  He carries the diagnosis of hypoertension, hyperlipidemia and diabetes.  No work up thus far for the twitching , no imaging.  Social history , one of 8 siblings, has 8 children .  Childhood asthma until age 37.  Interval history from 01/26/2016. Peter [name] is seen here today again with the identical complaint of right eye closure. He brought me today a picture that was taken over 3 years ago in [place] additional symptoms with exactly the same facial features, a closed right eye down to a slit in the left eye appears to be normal in size.. Based on this he has had this condition for several years. He also has the associated twitching as I described in January of last year. He has been followed by [facility] Dr. Marygrace Drought. Closure of the right eye with eye twitching with a chief complaint for the ophthalmology visit. The complication is that he has involuntary orbicular contractions with variable degrees of eye closure. Sometimes the eye closure is stronger than  others. These right-sided hemifacial spasms would warrant to defer any intervention of surgical nature, however they could be treated with Botox which is not a permanent treatment form. Botox is expected to work for about 90 days. Most times his eye closure is incomplete and will leave at about 2 mm slit.  I will prepare him by MRI brain, open MRI requested. MRI with Xanax, premedication. Wife will be the designated driver.    Review of Systems: Out of a complete 14 system review, the patient complains of only the following symptoms, and all other reviewed systems are negative. Facial right ocularis muscle twitching. Claustrophobic .   Social History   Social History  . Marital Status: Married    Spouse Name: N/A  . Number of Children: 8  . Years of Education: N/A   Occupational History  . 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 Smoking status: Former Smoker    Quit date: 08/09/1974  . Smokeless tobacco: Never Used  . Alcohol Use: 0.0 oz/week    0 Standard drinks or equivalent per week     Comment: 1-2 drinks daily, sometimes none  . Drug Use: No  . Sexual Activity:    Partners: Female   Other Topics Concern  . Not on file   Social History Narrative   Lives with wife, dog.  Children live in [place], [place], [place], [place], and 2 daughters here in  [place], [employer] 15 grandchildren, 5 great-grandchildren   Patient is right handed.  And drinks caffeine daily.    Family History  Problem Relation Age of Onset  . Alcohol abuse Father   . Cancer Sister     stomach/?pancreatic  . Colon cancer Brother 75  . Cancer Brother     colon cancer in mid 68's  . Arthritis Daughter     psoriatic  . Hyperlipidemia Son   . Stroke Neg Hx   . Heart disease Neg Hx   . Arthritis Daughter     psoriatic  . Hyperlipidemia Son   . Diabetes Maternal Grandmother     Past Medical History  Diagnosis Date  . Hypertension age 57  . Diabetes mellitus diet controlled  .  Hiatal hernia   . Hypercholesteremia   . FHx: colon cancer   . Asthma h/o as a child-s/p immunotherapy  . Adenomatous colon polyp 07/2013  . Internal hemorrhoids 07/2013    noted on colonoscopy  . 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 Restlessness and agitation 08/13/2014    Past Surgical History  Procedure Laterality Date  . Cardiovascular stress test  normal  . Appendectomy    . Vasectomy    . Colonoscopy  3/08, 07/2013    no repeat rec due to age per [name]  . Cataract extraction, bilateral  07/2014, 08/2014    Current Outpatient Prescriptions  Medication Sig Dispense Refill  . atorvastatin (LIPITOR) 10 MG tablet TAKE 1 TABLET ONCE DAILY. 90 tablet 0  . carvedilol (COREG) 25 MG tablet TAKE 1 TABLET TWICE DAILY. 180 tablet 0  . co-enzyme Q-10 30 MG capsule Take 30 mg by mouth 3 (three) times daily. Reported on 12/22/2015    . fluticasone (FLONASE) 50 MCG/ACT nasal spray USE 2 SPRAYS IN EACH NOSTRIL ONCE DAILY 32 g 2  . losartan (COZAAR) 50 MG tablet Take 1 tablet (50 mg total) by mouth daily. 30 tablet 0  . Multiple Vitamins-Minerals (MULTIVITAMIN WITH MINERALS) tablet Take 1 tablet by mouth daily. Reported on 12/22/2015    . Omega-3 Fatty Acids (FISH OIL) 1000 MG CAPS Take 1,000 mg by mouth daily. Reported on 12/22/2015    . Pseudoephedrine-DM-GG-APAP (SUDAFED COLD/COUGH PO) Take 2 tablets by mouth as needed.     No current facility-administered medications for this visit.    Allergies as of 01/26/2016 - Review Complete 01/26/2016  Allergen Reaction Noted  . Iodine Other (See Comments) 01/14/2011    Vitals: BP 112/60 mmHg  Pulse 70  Resp 20  Ht 5\' 7"  (1.702 m)  Wt 186 lb (84.369 kg)  BMI 29.12 kg/m2 Last Weight:  Wt Readings from Last 1 Encounters:  01/26/16 186 lb (84.369 kg)   Last Height:   Ht Readings from Last 1 Encounters:  01/26/16 5\' 7"  (1.702 m)    Physical exam:  General: The patient is awake, alert and appears not in acute distress. The patient is well  groomed. Head: Normocephalic, atraumatic. Neck is supple. Mallampati 3 , neck circumference: 16.  Cardiovascular:  Regular rate and rhythm *, without  murmurs or carotid bruit, and without distended neck veins. Respiratory: Lungs are clear to auscultation. Skin:  Without evidence of edema, or rash Trunk:normal posture.  Neurologic exam : The patient is awake and alert, oriented to place and time.  Memory subjective described as intact.  There is a normal attention span & concentration ability. Speech is fluent without dysarthria, dysphonia or aphasia.  Mood and affect are appropriate.  Cranial nerves: Pupils are equal and briskly reactive to light. Funduscopic exam without  evidence of pallor or edema. Extraocular movements  in vertical and horizontal planes intact and without nystagmus.  Visual fields by finger perimetry are intact. Hearing to finger rub intact.  Facial sensation intact to fine touch. Facial motor strength is affected by the right facial spasms- the right moth is pulled , the eye closed , the brow lower.  The  tongue and uvula move midline.  Tongue protrusion into either cheek is normal. Shoulder shrug is restricted his right shoulder move.   Motor exam:  Normal tone, muscle bulk and symmetric strength in all extremities. Acromioclavicular click, left shoulder.   Stance is stable and normal. Tandem gait is unfragmented.  Deep tendon reflexes: in the  upper and lower extremities are symmetric and intact. Babinski maneuver response is  downgoing.   Assessment:  After physical and neurologic examination, review of laboratory studies, imaging, neurophysiology testing and pre-existing records, assessment is that of : Longstanding HTN ( diagnosed at age 28 ) , diabetes and hyperlipidemia , increasing his stroke risk.    Physically not very active lifestyle, but no sign of other focal neurological abnormalities. He appears restless. He is constantly moving .  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 Prep for Botox evaluation with Dr Jaynee Eagles or Krista Blue. MRI brain.    Plan:  Treatment plan and additional workup :  The patient can first try to reduce his caffeine intake,  And I encouraged him to keep a facial spasm diary.  Visit 45 minutes with 20 minutes of discussion and answering questions.  MRI brain to evaluate white matter disease as a possible cause. Open MRI requested.   I will refer to Dr. Krista Blue  or  Dr. Jaynee Eagles for Botox consult.        Peter Partridge Neil Brickell MD 01/26/2016

## 2016-01-30 ENCOUNTER — Other Ambulatory Visit: Payer: Self-pay | Admitting: Family Medicine

## 2016-01-30 NOTE — Telephone Encounter (Signed)
Tessalon Perles denied as this is not maintenance medication.

## 2016-02-01 ENCOUNTER — Telehealth: Payer: Self-pay | Admitting: Neurology

## 2016-02-01 NOTE — Telephone Encounter (Signed)
Dr. Brett Fairy, thank you for the referral of this patient! Unfortunately I have never performed botox for hemifacial spasm and I refer these patients to Dr. Rexene Alberts or Dr. Krista Blue. I hope this is ok with you. I also refer my cervical dystonia patients to Dr. Krista Blue as well. But happy to perform Botox migraine and other injections for headaches, I've also started doing Botox for hyperhidrosis as well.   Terrence Dupont, can you reschedule patient to Dr. Rexene Alberts please? They are on the schedule for Tuesday.  Thank you again Dr. Brett Fairy, happy to see your patients anytime !

## 2016-02-02 NOTE — Telephone Encounter (Signed)
Dr Jaynee Eagles- FYI Dr Rexene Alberts was booked out for a few weeks. Dr Krista Blue had availability.

## 2016-02-02 NOTE — Telephone Encounter (Signed)
I called patient and rescheduled with Dr. Krista Blue. Patient voiced understanding.

## 2016-02-03 ENCOUNTER — Other Ambulatory Visit: Payer: Self-pay | Admitting: Family Medicine

## 2016-02-03 ENCOUNTER — Ambulatory Visit: Payer: Medicare Other | Admitting: Neurology

## 2016-02-03 NOTE — Telephone Encounter (Signed)
Pt is to take 1/2 of losartan according to cpe back in may. Refilling for 6 months

## 2016-02-06 ENCOUNTER — Telehealth: Payer: Self-pay | Admitting: Family Medicine

## 2016-02-06 NOTE — Telephone Encounter (Signed)
Pt says he is still having a difficult time with coughing. He is requesting a referral to ENT, Dr Ernesto Rutherford, asap

## 2016-02-06 NOTE — Telephone Encounter (Signed)
Agree.  We said trial of Spiriva, and then referral to PULMONARY if cough persisted.  Since we also thought there might be a drainage component, we suggested using Coricidin HBP (and not sudafed, that had helped him some, but raises blood pressure).  Please touch base with the patient and see how he is doing with the Spiriva, and also enough no fever, chest pain, shortness of breath

## 2016-02-06 NOTE — Telephone Encounter (Signed)
Forwarding to [place] since Dr Tomi Bamberger out of the office & pt hoping to get referral started today

## 2016-02-06 NOTE — Telephone Encounter (Signed)
Dr. Johnsie Kindred notes suggest trial of Spiriva?  Has he done this?  If not, I would do this first.   Spiriva is an inhaler, using 2 puffs daily to calm down symptoms.   I would try this first.  If needed get more symptom info.

## 2016-02-06 NOTE — Telephone Encounter (Signed)
Pts aware and inhaler is up front

## 2016-02-09 NOTE — Telephone Encounter (Signed)
Ok to start with ENT, if he prefers, but based on PFT's, I recommend trial of Spiriva and seeing pulmonary. We can see what Dr. Ernesto Rutherford says.  Please send CXR and PFT's, along with the following info (from his Wellness visit) with the referral to Dr. Ernesto Rutherford:  He was given a course of steroids (prednisone 20mg  BID x 5 days) after CXR negative to see if it helped with his cough. It seemed to help, but reports that the cough never completely went away. He had been using Flonase regularly. He has some good days, and other days that he coughs more. It seems the days he is more active, he has more cough (up moving around more, seems to cough more). Once he can cough up sputum, he feels better. He has tried using sudafed with good results. Spirometry showed obstructive airway disease; CXR didn't show COPD changes. Steroids were also felt to potentially help with allergic symptoms. He as also recently changed from ACEI to ARB due to cough (mid-May)

## 2016-02-09 NOTE — Telephone Encounter (Signed)
Pt's wife stated that he needs to see ENT, Dr Ernesto Rutherford. Pt said he is doing better but has a "strange cough" still. Has been taking coricidine (2 boxes) No other symptoms. Feels it is more in his throat then anywhere else.

## 2016-02-09 NOTE — Telephone Encounter (Signed)
Dr Berle Mull office is closed until Wednesday. LM with answering service and faxed over pts information. Pt is aware.

## 2016-02-11 ENCOUNTER — Ambulatory Visit (INDEPENDENT_AMBULATORY_CARE_PROVIDER_SITE_OTHER): Payer: Medicare Other | Admitting: Neurology

## 2016-02-11 ENCOUNTER — Encounter: Payer: Self-pay | Admitting: Neurology

## 2016-02-11 ENCOUNTER — Telehealth: Payer: Self-pay | Admitting: Family Medicine

## 2016-02-11 VITALS — BP 171/73 | HR 67 | Ht 67.0 in | Wt 187.5 lb

## 2016-02-11 DIAGNOSIS — R269 Unspecified abnormalities of gait and mobility: Secondary | ICD-10-CM | POA: Diagnosis not present

## 2016-02-11 DIAGNOSIS — G518 Other disorders of facial nerve: Secondary | ICD-10-CM | POA: Diagnosis not present

## 2016-02-11 DIAGNOSIS — G5139 Clonic hemifacial spasm, unspecified: Secondary | ICD-10-CM

## 2016-02-11 NOTE — Telephone Encounter (Signed)
Spoke with pt and he decided that he wants to see Dr.Newman-set him up for this Friday 02/13/16 @ 3:15pm. Records faxed.

## 2016-02-11 NOTE — Telephone Encounter (Signed)
Pt's wife, Stasia Cavalier, called requesting to speak to [place] regarding pt's cough. Wife did not want to go into detail as to what was going on or what was needed.

## 2016-02-11 NOTE — Progress Notes (Signed)
PATIENT: Peter [name] DOB: [birth year]  Chief Complaint  Patient presents with  . Hemifacial spasm    He is here with his wife, [name], to discuss possible Botox treatment for his right-sided facial spasm.     HISTORICAL  Peter [name] is 82 years old right-handed male, accompanied by his wife, seen in refer by Dr. Brett Fairy for evaluation of EMG guided botulism toxin injection for right hemifacial spasm  I reviewed and summarized his medical history, he had a history of hypertension hyperlipidemia, bilateral cataract surgery, around 2015, he noticed right facial muscle spasm, initially mainly involving muscle around his right eye, over the past few years, gradually getting worse, more frequent, occasionally blocking his right vision, also spreading to right cheek, right upper mouth corner, is present 25% daytime, he also has bilateral senior ptosis, occasionally blocking upper vision, was suggested bilateral blepharoplasty by his ophthalmologist.   He denies a history of right facial palsy, no visual loss, no hearing loss,  He complains of chronic cough for few months, He also complains of chronic low back pain, knee pain, mild gait abnormality.  REVIEW OF SYSTEMS: Full 14 system review of systems performed and notable only for Dizziness, facial drooping, cough, wheezing, shortness of breath, walking difficulty, hearing loss, ringing ears, runny nose  ALLERGIES: Allergies  Allergen Reactions  . Iodine Other (See Comments)    Feels like he is "burning up"    HOME MEDICATIONS: Current Outpatient Prescriptions  Medication Sig Dispense Refill  . ALPRAZolam (XANAX) 0.5 MG tablet Take 1 tablet (0.5 mg total) by mouth at bedtime as needed for anxiety. 30 tablet 0  . atorvastatin (LIPITOR) 10 MG tablet TAKE 1 TABLET ONCE DAILY. 90 tablet 0  . carvedilol (COREG) 25 MG tablet TAKE 1 TABLET TWICE DAILY. 180 tablet 0  . co-enzyme Q-10 30 MG capsule Take 30 mg by mouth 3  (three) times daily. Reported on 12/22/2015    . fluticasone (FLONASE) 50 MCG/ACT nasal spray USE 2 SPRAYS IN EACH NOSTRIL ONCE DAILY 32 g 2  . losartan (COZAAR) 50 MG tablet TAKE 1 TABLET ONCE DAILY. 30 tablet 2  . Multiple Vitamins-Minerals (MULTIVITAMIN WITH MINERALS) tablet Take 1 tablet by mouth daily. Reported on 12/22/2015    . Omega-3 Fatty Acids (FISH OIL) 1000 MG CAPS Take 1,000 mg by mouth daily. Reported on 12/22/2015    . Pseudoephedrine-DM-GG-APAP (SUDAFED COLD/COUGH PO) Take 2 tablets by mouth as needed.    . Tiotropium Bromide Monohydrate (SPIRIVA HANDIHALER IN) Inhale into the lungs daily.     No current facility-administered medications for this visit.    PAST MEDICAL HISTORY: Past Medical History  Diagnosis Date  . Hypertension age 80  . Diabetes mellitus diet controlled  . Hiatal hernia   . Hypercholesteremia   . FHx: colon cancer   . Asthma h/o as a child-s/p immunotherapy  . Adenomatous colon polyp 07/2013  . Internal hemorrhoids 07/2013    noted on colonoscopy  . 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 Restlessness and agitation 08/13/2014    PAST SURGICAL HISTORY: Past Surgical History  Procedure Laterality Date  . Cardiovascular stress test  normal  . Appendectomy    . Vasectomy    . Colonoscopy  3/08, 07/2013    no repeat rec due to age per [name]  . Cataract extraction, bilateral  07/2014, 08/2014    FAMILY HISTORY: Family History  Problem Relation Age of Onset  . Alcohol abuse Father   .  Cancer Sister     stomach/?pancreatic  . Colon cancer Brother 12  . Cancer Brother     colon cancer in mid 63's  . Arthritis Daughter     psoriatic  . Hyperlipidemia Son   . Stroke Neg Hx   . Heart disease Neg Hx   . Arthritis Daughter     psoriatic  . Hyperlipidemia Son   . Diabetes Maternal Grandmother     SOCIAL HISTORY:  Social History   Social History  . Marital Status: Married    Spouse Name: N/A  . Number of Children: 8  . Years of Education:  N/A   Occupational History  . 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 Smoking status: Former Smoker    Quit date: 08/09/1974  . Smokeless tobacco: Never Used  . Alcohol Use: 0.0 oz/week    0 Standard drinks or equivalent per week     Comment: 1-2 drinks daily, sometimes none  . Drug Use: No  . Sexual Activity:    Partners: Female   Other Topics Concern  . Not on file   Social History Narrative   Lives with wife, dog.  Children live in [place], [place], [place], [place], and 2 daughters here in [place], [place]. 15 grandchildren, 5 great-grandchildren   Patient is right handed.  And drinks caffeine daily.     PHYSICAL EXAM   Filed Vitals:   02/11/16 0812  BP: 171/73  Pulse: 67  Height: 5\' 7"  (1.702 m)  Weight: 187 lb 8 oz (85.049 kg)    Not recorded      Body mass index is 29.36 kg/(m^2).  PHYSICAL EXAMNIATION:  Gen: NAD, conversant, well nourised, obese, well groomed                     Cardiovascular: Regular rate rhythm, no peripheral edema, warm, nontender. Eyes: Conjunctivae clear without exudates or hemorrhage Neck: Supple, no carotid bruise. Pulmonary: Clear to auscultation bilaterally   NEUROLOGICAL EXAM:  MENTAL STATUS: Speech:    Speech is normal; fluent and spontaneous with normal comprehension.  Cognition:     Orientation to time, place and person     Normal recent and remote memory     Normal Attention span and concentration     Normal Language, naming, repeating,spontaneous speech     Fund of knowledge   CRANIAL NERVES: CN II: Visual fields are full to confrontation. Fundoscopic exam is normal with sharp discs and no vascular changes. Pupils are round equal and briskly reactive to light. CN III, IV, VI: extraocular movement are normal. No ptosis. CN V: Facial sensation is intact to pinprick in all 3 divisions bilaterally. Corneal responses are intact.  CN VII: He has frequent right hemifacial muscle spasm, mainly involving  right orbicularis oris oculi, occasional involvement of right cheek, upper mouth area CN VIII: Hearing is normal to rubbing fingers CN IX, X: Palate elevates symmetrically. Phonation is normal. CN XI: Head turning and shoulder shrug are intact CN XII: Tongue is midline with normal movements and no atrophy.  MOTOR: There is no pronator drift of out-stretched arms. Muscle bulk and tone are normal. Muscle strength is normal.  REFLEXES: Reflexes are 2+ and symmetric at the biceps, triceps, knees, and ankles. Plantar responses are flexor.  SENSORY: Intact to light touch, pinprick, positional sensation and vibratory sensation are intact in fingers and toes.  COORDINATION: Rapid alternating movements and fine finger movements are intact. There is  no dysmetria on finger-to-nose and heel-knee-shin.    GAIT/STANCE: He needs push up to get up from seated position, mildly limp, unsteady   DIAGNOSTIC DATA (LABS, IMAGING, TESTING) - I reviewed patient records, labs, notes, testing and imaging myself where available.   ASSESSMENT AND PLAN  Venard Lescarbeau Mish is a 82 y.o. male   Right hemifacial spasm  Complete evaluation with MRI of the brain with and without contrast  Preauthorization for EMG guided xeomin injection, I asked for 50 units  Return to clinic in one month for injection  Gait abnormality  Multifactorial, including low back pain, knee pain, deconditioning,   [name], M.D. Ph.D.  [facility] [address], [place], [place] [address] Ph: [phone] Fax: [phone]  CC: Rita Ohara, MD

## 2016-02-12 ENCOUNTER — Institutional Professional Consult (permissible substitution): Payer: Medicare Other | Admitting: Internal Medicine

## 2016-02-12 ENCOUNTER — Ambulatory Visit (INDEPENDENT_AMBULATORY_CARE_PROVIDER_SITE_OTHER): Payer: Medicare Other | Admitting: Cardiovascular Disease

## 2016-02-12 ENCOUNTER — Encounter: Payer: Self-pay | Admitting: Cardiovascular Disease

## 2016-02-12 ENCOUNTER — Encounter (INDEPENDENT_AMBULATORY_CARE_PROVIDER_SITE_OTHER): Payer: Self-pay

## 2016-02-12 VITALS — BP 122/68 | HR 62 | Ht 68.0 in | Wt 187.4 lb

## 2016-02-12 DIAGNOSIS — I2584 Coronary atherosclerosis due to calcified coronary lesion: Secondary | ICD-10-CM

## 2016-02-12 DIAGNOSIS — G5139 Clonic hemifacial spasm, unspecified: Secondary | ICD-10-CM | POA: Insufficient documentation

## 2016-02-12 DIAGNOSIS — E7800 Pure hypercholesterolemia, unspecified: Secondary | ICD-10-CM

## 2016-02-12 DIAGNOSIS — I2510 Atherosclerotic heart disease of native coronary artery without angina pectoris: Secondary | ICD-10-CM | POA: Diagnosis not present

## 2016-02-12 DIAGNOSIS — E785 Hyperlipidemia, unspecified: Secondary | ICD-10-CM | POA: Diagnosis not present

## 2016-02-12 DIAGNOSIS — I10 Essential (primary) hypertension: Secondary | ICD-10-CM | POA: Diagnosis not present

## 2016-02-12 DIAGNOSIS — R269 Unspecified abnormalities of gait and mobility: Secondary | ICD-10-CM | POA: Insufficient documentation

## 2016-02-12 NOTE — Progress Notes (Signed)
Peter [name] Date of Birth  01-18-1930 [facility] [id number]     [place], [place]  [address] [phone]  Fax  [phone]   Problems 1. Htn 2. DM 3. Hyperlipidemia 4. Elevated coronary calcium 5.  Mild carotid artery disease ( 40-60 % bilaterally)   History of Present Illness:  82 yo patient of Dr. Verlon Setting.  Hx of diabetes, HTN, dyslipidemia, and elevated coronary calcium score.  Also has mild-moderate carotid disease.  Is active but he doesn't walk regularly.  Has a cabin in [place].  No chest pain or dyspnea with his usual activities.    Oct. 3, 2013 -  He has not had any cardiac problems - no chest pain.  He stays avtive but does not walk regularly.  He describes some symptoms c/w claudication but has great pulses in his feet.  His systolic BP has been a bit elevated.  November 07, 2012:  Peter [name] is doing well.  He has been going up to his cabin in [place] periodically.    He measures his BP regularly - some times his systolic readings are a bit high.  He wants to start walking regularly.    He does have some chest tightness when he walks.  He has had stress nuclear tests with Dr Verlon Setting which were negative.  He also has a hiatal hernia and esophageal spasm ( according to radiologist)  .  Aug 13 2013:  Peter [name] has done well.  He still has some mild chest discomfort with exertion - especially after he has eaten.    The pain is relieved with Tums.   He has known esophageal spasm. The pain is a  Mild tightness.  He had a myoview study 8 years ago ( Dr. Verlon Setting) which was negative.  He is not inclined to repeat the [facility] unless it is absolutely necessary.   He still gets up to his [place] house in [place].    He is able to work up there for hours without CP or dyspnea.    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  Sept. 9, 2015:  Still active and able to do all of his  normal activities. He had a lifeline screening done.  He has very mild carotid plaque.  We have known about this mild plaque. He also has some symptoms of orthostasis.   February 12, 2016:  Felling well. No CP or dyspnea. Has had a cough since April . Is trying lots of breathing meds.  Is fatigued. Has orthostasis on occasion .  Weakness  in his legs .  Current Outpatient Prescriptions on File Prior to Visit  Medication Sig Dispense Refill  . ALPRAZolam (XANAX) 0.5 MG tablet Take 1 tablet (0.5 mg total) by mouth at bedtime as needed for anxiety. 30 tablet 0  . co-enzyme Q-10 30 MG capsule Take 30 mg by mouth 3 (three) times daily. Reported on 12/22/2015    . fluticasone (FLONASE) 50 MCG/ACT nasal spray USE 2 SPRAYS IN EACH NOSTRIL ONCE DAILY 32 g 2  . Multiple Vitamins-Minerals (MULTIVITAMIN WITH MINERALS) tablet Take 1 tablet by mouth daily. Reported on 12/22/2015    . Omega-3 Fatty Acids (FISH OIL) 1000 MG CAPS Take 1,000 mg by mouth daily. Reported on 12/22/2015    . Pseudoephedrine-DM-GG-APAP (SUDAFED COLD/COUGH PO) Take 2 tablets by mouth as needed (for coughing).     Marland Kitchen  Tiotropium Bromide Monohydrate (SPIRIVA HANDIHALER IN) Inhale into the lungs daily.     No current facility-administered medications on file prior to visit.   he is not taking the norvasc currently.   Allergies  Allergen Reactions  . Iodine Other (See Comments)    Feels like he is "burning up"    Past Medical History  Diagnosis Date  . Hypertension age 81  . Diabetes mellitus diet controlled  . Hiatal hernia   . Hypercholesteremia   . FHx: colon cancer   . Asthma h/o as a child-s/p immunotherapy  . Adenomatous colon polyp 07/2013  . Internal hemorrhoids 07/2013    noted on colonoscopy  . 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 Restlessness and agitation 08/13/2014    Past Surgical History  Procedure Laterality Date  . Cardiovascular stress test  normal  . Appendectomy    . Vasectomy    . Colonoscopy  3/08,  07/2013    no repeat rec due to age per [name]  . Cataract extraction, bilateral  07/2014, 08/2014    History  Smoking status  . Former Smoker  . Quit date: 08/09/1974  Smokeless tobacco  . Never Used    History  Alcohol Use  . 0.0 oz/week  . 0 Standard drinks or equivalent per week    Comment: 1-2 drinks daily, sometimes none    Family History  Problem Relation Age of Onset  . Alcohol abuse Father   . Cancer Sister     stomach/?pancreatic  . Colon cancer Brother 65  . Cancer Brother     colon cancer in mid 71's  . Arthritis Daughter     psoriatic  . Hyperlipidemia Son   . Stroke Neg Hx   . Heart disease Neg Hx   . Arthritis Daughter     psoriatic  . Hyperlipidemia Son   . Diabetes Maternal Grandmother     Reviw of Systems:  Reviewed in the HPI.  All other systems are negative.  Physical Exam: BP 122/68 mmHg  Pulse 62  Ht 5\' 8"  (1.727 m)  Wt 187 lb 6.4 oz (85.004 kg)  BMI 28.50 kg/m2  SpO2 95% The patient is alert and oriented x 3.  The mood and affect are normal.   Skin: warm and dry.  Color is normal.    HEENT:   the sclera are nonicteric.  The mucous membranes are moist.  The carotids are 2+ without bruits.  There is no thyromegaly.  There is no JVD.  Lungs: clear.  The chest wall is non tender.   Heart: regular rate with a normal S1 and S2.  There are no murmurs, gallops, or rubs. The PMI is not displaced.    Abdomin: good bowel sounds.  There is no guarding or rebound.  There is no hepatosplenomegaly or tenderness.  There are no masses.  Extremities:  no clubbing, cyanosis, or edema.  The legs are without rashes.  The distal pulses are intact ( 2-3+)  Neuro:  Cranial nerves II - XII are intact.  Motor and sensory functions are intact.   The gait is normal.  ECG:  February 12, 2016:  NSR at 62.      Assessment / Plan:   1. Htn-    Look great   2. DM - HbA1C have been stable   3. Hyperlipidemia - on atorvastatin 10 mg a day   4. Elevated  coronary calcium - no angina ,  Continue aggressive lipid management   5.  Mild carotid artery disease ( 40-60 % bilaterally) Stable     Mertie Moores, MD  02/12/2016 9:02 AM    [facility] [place],  [place], [place]  [address] Pager [phone] Phone: [phone]; Fax: [phone]

## 2016-02-12 NOTE — Patient Instructions (Signed)
Medication Instructions:  Your physician recommends that you continue on your current medications as directed. Please refer to the Current Medication list given to you today.   Labwork: None Ordered   Testing/Procedures: None Ordered   Follow-Up: Your physician wants you to follow-up in: 1 year with Dr. Nahser.  You will receive a reminder letter in the mail two months in advance. If you don't receive a letter, please call our office to schedule the follow-up appointment.   If you need a refill on your cardiac medications before your next appointment, please call your pharmacy.   Thank you for choosing [facility]! Virgie Kunda, RN [phone]    

## 2016-02-13 DIAGNOSIS — J370 Chronic laryngitis: Secondary | ICD-10-CM | POA: Diagnosis not present

## 2016-02-20 ENCOUNTER — Ambulatory Visit
Admission: RE | Admit: 2016-02-20 | Discharge: 2016-02-20 | Disposition: A | Discharge status: Home / Self Care | Payer: Medicare Other | Source: Ambulatory Visit | Attending: Neurology | Admitting: Neurology

## 2016-02-20 DIAGNOSIS — G5139 Clonic hemifacial spasm, unspecified: Secondary | ICD-10-CM

## 2016-02-20 DIAGNOSIS — G518 Other disorders of facial nerve: Secondary | ICD-10-CM | POA: Diagnosis not present

## 2016-02-25 ENCOUNTER — Telehealth: Payer: Self-pay

## 2016-02-25 DIAGNOSIS — R1313 Dysphagia, pharyngeal phase: Secondary | ICD-10-CM | POA: Diagnosis not present

## 2016-02-25 NOTE — Telephone Encounter (Signed)
I called patient back and he is aware of results and voiced understanding

## 2016-02-25 NOTE — Telephone Encounter (Signed)
-----   Message from Larey Seat, MD sent at 02/25/2016 11:29 AM EDT ----- Mild chronic microvascular disease, sinusitis, no stroke, no scars. CD

## 2016-02-25 NOTE — Telephone Encounter (Signed)
LM for patient to call back for results

## 2016-02-25 NOTE — Telephone Encounter (Signed)
Patient is returning a call regarding lab results.

## 2016-04-01 ENCOUNTER — Telehealth: Payer: Self-pay | Admitting: Neurology

## 2016-04-01 ENCOUNTER — Encounter: Payer: Self-pay | Admitting: Neurology

## 2016-04-01 ENCOUNTER — Ambulatory Visit (INDEPENDENT_AMBULATORY_CARE_PROVIDER_SITE_OTHER): Payer: Medicare Other | Admitting: Neurology

## 2016-04-01 VITALS — BP 130/66 | HR 65 | Ht 68.0 in | Wt 192.0 lb

## 2016-04-01 DIAGNOSIS — G5139 Clonic hemifacial spasm, unspecified: Secondary | ICD-10-CM

## 2016-04-01 DIAGNOSIS — G518 Other disorders of facial nerve: Secondary | ICD-10-CM

## 2016-04-01 MED ORDER — INCOBOTULINUMTOXINA 50 UNITS IM SOLR
50.0000 [IU] | INTRAMUSCULAR | Status: DC
  Administered 2016-04-01: 50 [IU] via INTRAMUSCULAR

## 2016-04-01 NOTE — Progress Notes (Signed)
PATIENT: Peter Peter [name] DOB: [birth year]  Chief Complaint  Patient presents with  . Hemifacial Spasms    He is here with wife, Peter Peter [name], to his first Xeomin injection.  Xeomin 50 units - office supply.     HISTORICAL  Peter Peter [name] is 82 years old right-handed male, accompanied by his wife, seen in refer by Dr. Brett Peter [name] for evaluation of EMG guided botulism toxin injection for right hemifacial spasm  I reviewed and summarized his medical history, he had a history of hypertension hyperlipidemia, bilateral cataract surgery, around 2015, he noticed right facial muscle spasm, initially mainly involving muscle around his right eye, over the past few years, gradually getting worse, more frequent, occasionally blocking his right vision, also spreading to right cheek, right upper mouth corner, is present 25% daytime, he also has bilateral senior ptosis, occasionally blocking upper vision, was suggested bilateral blepharoplasty by his ophthalmologist.   He denies a history of right facial palsy, no visual loss, no hearing loss,  He complains of chronic cough for few months, He also complains of chronic low back pain, knee pain, mild gait abnormality.  Update April 01 2016:  We have personally reviewed MRI of brain in July 2017, there is evidence of generalized atrophy, supratentorium small vessel disease, no acute abnormalities.  We reviewed that he has vascular risk factor of aging, hypertension, hyperlipidemia, he should take baby aspirin daily.  Today is his first EMG guided Xeomin injection for right hemifacial spasm, all the questions were answered, potential side effect was explained  REVIEW OF SYSTEMS: Full 14 system review of systems performed and notable only for as above ALLERGIES: Allergies  Allergen Reactions  . Iodine Other (See Comments)    Feels like he is "burning up"    HOME MEDICATIONS: Current Outpatient Prescriptions  Medication Sig Dispense Refill    . ALPRAZolam (XANAX) 0.5 MG tablet Take 1 tablet (0.5 mg total) by mouth at bedtime as needed for anxiety. 30 tablet 0  . atorvastatin (LIPITOR) 10 MG tablet Take 10 mg by mouth daily.    . carvedilol (COREG) 25 MG tablet Take 25 mg by mouth daily.    Marland Kitchen co-enzyme Q-10 30 MG capsule Take 30 mg by mouth 3 (three) times daily. Reported on 12/22/2015    . fluticasone (FLONASE) 50 MCG/ACT nasal spray USE 2 SPRAYS IN EACH NOSTRIL ONCE DAILY 32 g 2  . losartan (COZAAR) 50 MG tablet Take 50 mg by mouth daily.    . Multiple Vitamins-Minerals (MULTIVITAMIN WITH MINERALS) tablet Take 1 tablet by mouth daily. Reported on 12/22/2015    . Omega-3 Fatty Acids (FISH OIL) 1000 MG CAPS Take 1,000 mg by mouth daily. Reported on 12/22/2015    . Pseudoephedrine-DM-GG-APAP (SUDAFED COLD/COUGH PO) Take 2 tablets by mouth as needed (for coughing).     . Tiotropium Bromide Monohydrate (SPIRIVA HANDIHALER IN) Inhale into the lungs daily.     No current facility-administered medications for this visit.     PAST MEDICAL HISTORY: Past Medical History:  Diagnosis Date  . Adenomatous colon polyp 07/2013  . Asthma h/o as a child-s/p immunotherapy  . Diabetes mellitus diet controlled  . 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 FHx: colon cancer   . Hiatal hernia   . Hypercholesteremia   . Hypertension Peter [name]  . Internal hemorrhoids 07/2013   noted on colonoscopy  . Restlessness and agitation 08/13/2014    PAST SURGICAL HISTORY: Past Surgical History:  Procedure Laterality Date  . APPENDECTOMY    .  CARDIOVASCULAR STRESS TEST  normal  . CATARACT EXTRACTION, BILATERAL  07/2014, 08/2014  . COLONOSCOPY  3/08, 07/2013   no repeat rec due to Peter [name] per [name]  . VASECTOMY      FAMILY HISTORY: Family History  Problem Relation Peter [name] of Onset  . Alcohol abuse Father   . Cancer Sister     stomach/?pancreatic  . Colon cancer Brother 65  . Cancer Brother     colon cancer in mid 31's  . Arthritis Daughter     psoriatic  .  Hyperlipidemia Son   . Stroke Neg Hx   . Heart disease Neg Hx   . Arthritis Daughter     psoriatic  . Hyperlipidemia Son   . Diabetes Maternal Grandmother     SOCIAL HISTORY:  Social History   Social History  . Marital status: Married    Spouse name: N/A  . Number of children: 8  . Years of education: N/A   Occupational History  . 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 Smoking status: Former Smoker    Quit date: 08/09/1974  . Smokeless tobacco: Never Used  . Alcohol use 0.0 oz/week     Comment: 1-2 drinks daily, sometimes none  . Drug use: No  . Sexual activity: Yes    Partners: Female   Other Topics Concern  . Not on file   Social History Narrative   Lives with wife, dog.  Children live in [place], [place], [place], [place], and 2 daughters here in [place], [place]. 15 grandchildren, 5 great-grandchildren   Patient is right handed.  And drinks caffeine daily.     PHYSICAL EXAM   Vitals:   04/01/16 1357  BP: 130/66  Pulse: 65  Weight: 192 lb (87.1 kg)  Height: 5\' 8"  (1.727 m)    Not recorded      Body mass index is 29.19 kg/m.  PHYSICAL EXAMNIATION:  Gen: NAD, conversant, well nourised, obese, well groomed                     Cardiovascular: Regular rate rhythm, no peripheral edema, warm, nontender. Eyes: Conjunctivae clear without exudates or hemorrhage Neck: Supple, no carotid bruise. Pulmonary: Clear to auscultation bilaterally   NEUROLOGICAL EXAM:  MENTAL STATUS: Speech:    Speech is normal; fluent and spontaneous with normal comprehension.  Cognition:     Orientation to time, place and person     Normal recent and remote memory     Normal Attention span and concentration     Normal Language, naming, repeating,spontaneous speech     Fund of knowledge   CRANIAL NERVES: CN II: Visual fields are full to confrontation. Fundoscopic exam is normal with sharp discs and no vascular changes. Pupils are round equal and briskly  reactive to light. CN III, IV, VI: extraocular movement are normal. No ptosis. CN V: Facial sensation is intact to pinprick in all 3 divisions bilaterally. Corneal responses are intact.  CN VII: He has frequent right hemifacial muscle spasm, mainly involving right orbicularis oris oculi, occasional involvement of right cheek, upper mouth area CN VIII: Hearing is normal to rubbing fingers CN IX, X: Palate elevates symmetrically. Phonation is normal. CN XI: Head turning and shoulder shrug are intact CN XII: Tongue is midline with normal movements and no atrophy.  MOTOR: There is no pronator drift of out-stretched arms. Muscle bulk and tone are normal. Muscle strength is normal.  REFLEXES: Reflexes are 2+  and symmetric at the biceps, triceps, knees, and ankles. Plantar responses are flexor.  SENSORY: Intact to light touch, pinprick, positional sensation and vibratory sensation are intact in fingers and toes.  COORDINATION: Rapid alternating movements and fine finger movements are intact. There is no dysmetria on finger-to-nose and heel-knee-shin.    GAIT/STANCE: He needs push up to get up from seated position, mildly limp, unsteady   DIAGNOSTIC DATA (LABS, IMAGING, TESTING) - I reviewed patient records, labs, notes, testing and imaging myself where available.   ASSESSMENT AND PLAN  Peter Peter [name] is a 82 y.o. male   Right hemifacial spasm  EMG guided xeomin injection, 50 units unit, used 35 units, discard 15 units  Right orbicularis oculi at 4, 6, 8, 9, 10:00, 2.5 units each= 12 point 5 units Right corrugate 5 units Left corrugate 5 units Procerus 5 units  Right frontalis 7.5 units into injection sites  Left frontalis 2.5 units Left orbicularis oculi at 3, 4:00, 2.5 units each= 5 units  Cerebral Small vessel disease  He has vascular risk factor of aging, hypertension, hyperlipidemia  Have advised him start aspirin 81mg  daily   Peter Peter [name], M.D. Ph.D.  [facility] [address] [place], [place] [address] Ph: [phone] Fax: [phone]  CC: Rita Ohara, MD

## 2016-04-01 NOTE — Telephone Encounter (Signed)
Desiree/[facility] [phone] called needs clarification on what amt of Xeomin was used at last OV.

## 2016-04-01 NOTE — Progress Notes (Signed)
**  Xeomin 50 units x 1 vial, Lot [id number], Exp 04/2017, [place] [id number], office supply. Mck,rn.**

## 2016-04-06 ENCOUNTER — Other Ambulatory Visit: Payer: Self-pay | Admitting: Family Medicine

## 2016-04-07 NOTE — Progress Notes (Deleted)
A1c at visit  Diabetes: He has never taken medications for diabetes.Denies polydipsia, polyuria. Doesn't really watch his diet much at all. He has been eating less candy, doesn't eat dessert.  His last A1c was elevated at 6.9, higher than in the past. He presents today for f/u on DM and repeat A1c.  He doesn't monitor his blood sugars.  He had first EMG guided Xeomin injection for right hemifacial spasm on 04/01/16.  He had f/u with Dr. Acie Fredrickson in July.   Elevated coronary calcium - no angina ,  Continue aggressive lipid management. Mild carotid artery disease ( 40-60 % bilaterally) Stable   HTN--he was changed from ACE to ARB in May, due to issue with chronic cough.  BP's have been well controlled, running  Cough--felt to be related to allergies, poss ACEI contributing. He takes Flonase, took prednisone which helped some but didn't resolve. Spirometry showed obstructive airway disease, but CXR didn't show COPD changes. When he was seen here for his physical in May, we recommended a trial of Omeprazole x 2 weeks.  If not better, then call for Spiriva sample.  If not better, refer to Pulmonary for evaluation.  It was felt there may also be a sinus component, since sudafed helped.  Avoid sudafed due to blood pressure. Can try coricidin HBP and sinus rinses (sinus rinse kit or Neti-pot).  He called requesting referral to Dr. Ernesto Rutherford (rather than following the plan we recommended, which was Spiriva trial since he had no benefit from omeprazole). We sent over referral.  We got no visit notes.  New anemia was noted (mild) at his visit 3 months ago.  He was supposed to be submitting samples to [name] (?which type--hemoccult, cologard?)  Hyperlipidemia follow-up: Compliant with medications and denies medication side effects.      A1c CBC ?pulm vs ENT--notes?

## 2016-04-08 ENCOUNTER — Encounter: Payer: Medicare Other | Admitting: Family Medicine

## 2016-04-08 NOTE — Progress Notes (Signed)
Chief Complaint  Patient presents with  . Diabetes    nonfasting med check. Having lightenedness more often than he was before when he stands up. Feels like he was doing better on the lisinopril. Since the lisinopril didn't stop his cough wonders if he should go back on it.     Diabetes: He has never taken medications for diabetes.Denies polydipsia, polyuria. Doesn't really watch his diet much at all. He has been eating less candy.  He doesn't monitor his blood sugars.  He saw [name] yesterday for f/u shoulder pain.  He is to continue home exercise program (to emphasize rotator cuff exercises), and Nitroglycerin protocol. Plan is to recheck with a repeat ultrasound in 6 weeks  He had first EMG guided Xeomin injection for right hemifacial spasm on 04/01/16. He and his family have noted improvement in the eye twitching/closing.  He had f/u with Dr. Acie Fredrickson in July.  Elevated coronary calcium - no angina ,  He recommended continuing aggressive lipid management. Mild carotid artery disease (seen on [facility] screening 02/2014). Denies any neurologic symptoms (other than twitching as above).  He brings in paperwork recommending repeat [facility] screening (advised this wasn't necessary).  He had many questions regarding the abnormal peripheral arterial disease screen--invalid reading, not that it was abnormal.    HTN--he was changed from ACE to ARB in May, due to issue with chronic cough.  BP's have been well controlled, running 127-140/60's, pulse 68-70.  He does report that BP's are much better since he has been taking his med BID as previously instructed. He gets dizzy after standing--not immediately, but when he starts to move.  He needs to fix his vision on something stationery to make the dizziness stop.  He thinks it may be related to his eyes/vision.  He also has ptosis which he isn't sure if it is affecting. He denies lightheadedness, but related to things moving in his peripheral vision, or  quick head movements. He denies any falls, but reports he does have a hard time walking straight (which he blamed on his feet).  Cough--He saw Dr. Lucia Gaskins twice.  He was treated with steroid taper, along with a z-pak and his cough has resolved. No records received. MRI showed sinus some chronic maxillary, ethmoid and frontal sinusitis (done mid-July). He currently feels fine--denies congestion, sinus pain, cough.  He mentioned that Dr. Lucia Gaskins also thought reflux could be contributing.  He denies heartburn.  When previously seen here with cough, it was felt that allergies was a factor, poss ACEI contributing (so changed to ARB). He had taken Flonase, took prednisone 20mg  BID x 5d) which helped some but didn't resolve. Spirometry showed obstructive airway disease, but CXR didn't show COPD changes. Symptoms didn't resolve after taking Omeprazole x 2 weeks, so then tried Spiriva sample. He picked up a sample, took about 1/2 of the sample, thought it helped a lot the first couple of days, but then he saw Dr. Lucia Gaskins and cough has resolved.  He has an appointment with Dr. Halford Chessman and wonders if he should cancel since his cough has resolved.  Hyperlipidemia follow-up: Compliant with medications and denies medication side effects.  Lab Results  Component Value Date   CHOL 147 01/06/2016   HDL 53 01/06/2016   LDLCALC 73 01/06/2016   TRIG 106 01/06/2016   CHOLHDL 2.8 01/06/2016   PMH, PSH, SH reviewed.  Outpatient Encounter Prescriptions as of 04/15/2016  Medication Sig Note  . atorvastatin (LIPITOR) 10 MG tablet Take 1 tablet (  10 mg total) by mouth daily.   . carvedilol (COREG) 25 MG tablet Take 1 tablet (25 mg total) by mouth 2 (two) times daily.   . fluticasone (FLONASE) 50 MCG/ACT nasal spray Place 2 sprays into both nostrils daily.   Marland Kitchen losartan (COZAAR) 50 MG tablet Take 50 mg by mouth daily. 04/15/2016: Takes 1/2 tablet daily  . [DISCONTINUED] atorvastatin (LIPITOR) 10 MG tablet Take 10 mg by mouth  daily.   . [DISCONTINUED] carvedilol (COREG) 25 MG tablet Take 25 mg by mouth 2 (two) times daily.    . [DISCONTINUED] fluticasone (FLONASE) 50 MCG/ACT nasal spray USE 2 SPRAYS IN EACH NOSTRIL ONCE DAILY   . co-enzyme Q-10 30 MG capsule Take 30 mg by mouth 3 (three) times daily. Reported on 12/22/2015   . Multiple Vitamins-Minerals (MULTIVITAMIN WITH MINERALS) tablet Take 1 tablet by mouth daily. Reported on 12/22/2015   . Omega-3 Fatty Acids (FISH OIL) 1000 MG CAPS Take 1,000 mg by mouth daily. Reported on 12/22/2015   . Tiotropium Bromide Monohydrate (SPIRIVA HANDIHALER IN) Inhale into the lungs daily.   . [DISCONTINUED] ALPRAZolam (XANAX) 0.5 MG tablet Take 1 tablet (0.5 mg total) by mouth at bedtime as needed for anxiety.   . [DISCONTINUED] Pseudoephedrine-DM-GG-APAP (SUDAFED COLD/COUGH PO) Take 2 tablets by mouth as needed (for coughing).     Facility-Administered Encounter Medications as of 04/15/2016  Medication  . incobotulinumtoxinA (XEOMIN) 50 units injection 50 Units   Allergies  Allergen Reactions  . Iodine Other (See Comments)    Feels like he is "burning up"   ROS: no fever, chills, headaches, URI symptoms, cough, shortness of breath, chest pain, palpitations, nausea, vomiting, heartburn, bowel changes.  +dizziness per HPI, further characterized as dysequilibrium.  Denies lightheadedness or syncope.  +ptosis and some ongoing vision problems.  No bleeding, bruising, rash or other complaints, except as noted in HPI.  PHYSICAL EXAM:  BP 122/60 (BP Location: Left Arm, Patient Position: Sitting, Cuff Size: Normal)   Pulse 72   Ht 5\' 9"  (1.753 m)   Wt 194 lb (88 kg) Comment: did not want to take shoes off  BMI 28.65 kg/m   Well developed, pleasant male in no distress. He is in good spirits. HEENT: PERRL, EOMI, conjunctiva and sclera are clear.  Nasal mucosa is mildly edematous, no drainage, no purulence or erythema.  Only very rare twitch of the right side of his face (milder, no  complete eye closing--significantly improved.) Neck: no lymphadenopathy, thyromegaly or carotid bruit Heart: regular rate and rhythm Lungs: clear bilaterally, fair air movement, no wheezes, rales, ronchi Abdomen: soft, nontender, no mass Extremities: no edema, 2+ pulses Psych: normal mood, affect, hygiene and grooming Neuro: alert and oriented, normal strength, gait.  Some asymmetry to face, rare twitch to right mouth/face/eye.    Lab Results  Component Value Date   HGBA1C 6.4 04/15/2016     ASSESSMENT/PLAN:  Type 2 diabetes mellitus without complication, without long-term current use of insulin (HCC) - remains diet controlled; continue lowfat, low carb diet,  - Plan: HgB A1c  Essential hypertension, benign - improved control (with improved compliance); continue current regimen - Plan: carvedilol (COREG) 25 MG tablet  Pure hypercholesterolemia - continue current regimen - Plan: atorvastatin (LIPITOR) 10 MG tablet  Cough - resolved with second course of steroids and z-pak; multifactorial and will likely recur. Keep pulm consult  Need for prophylactic vaccination and inoculation against influenza - Plan: Flu vaccine HIGH DOSE PF (Fluzone High dose)  Allergic rhinitis, unspecified allergic  rhinitis type - continue flonase and zyrtec/antihistamine - Plan: fluticasone (FLONASE) 50 MCG/ACT nasal spray  Vertigo - Plan: Ambulatory Referral to Neuro Rehab  Immunization due - Plan: Pneumococcal polysaccharide vaccine 23-valent greater than or equal to 2yo subcutaneous/IM      I think you should keep your appointment with Dr. Halford Chessman. We know that at some point your cough will likely come back, and this way we will have a plan.  He should be able to answer the question as to whether or not you need to be taking any inhaled medications, and what the next step would be if/when your cough returns.  Reflux is likely a contributing factor, as are your sinuses. So, if/when the cough returns do  not wait very long before restarting proton pump inhibitors (ie prilosec to treat the acid reflux/hiatal hernia) as well as your allergies with flonase, zyrtec, mucinex.  We are referring you for physical therapy to help with your dizziness and balance.  Your blood pressure is very well controlled. Continue your current medications.  Your diabetes remains diet controlled--continue to limit your sweets, sugars, carbs, and try and get daily exercise.

## 2016-04-14 ENCOUNTER — Encounter: Payer: Self-pay | Admitting: Sports Medicine

## 2016-04-14 ENCOUNTER — Ambulatory Visit (INDEPENDENT_AMBULATORY_CARE_PROVIDER_SITE_OTHER): Payer: Medicare Other | Admitting: Sports Medicine

## 2016-04-14 DIAGNOSIS — M12811 Other specific arthropathies, not elsewhere classified, right shoulder: Secondary | ICD-10-CM | POA: Diagnosis not present

## 2016-04-14 DIAGNOSIS — M75101 Unspecified rotator cuff tear or rupture of right shoulder, not specified as traumatic: Secondary | ICD-10-CM
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
## 2016-04-14 NOTE — Assessment & Plan Note (Signed)
Patient has good function and is a good candidate for conservative care Home exercise program to emphasize rotator cuff exercises Nitroglycerin protocol Recheck with a repeat ultrasound in 6 weeks

## 2016-04-15 ENCOUNTER — Encounter: Payer: Self-pay | Admitting: Family Medicine

## 2016-04-15 ENCOUNTER — Ambulatory Visit (INDEPENDENT_AMBULATORY_CARE_PROVIDER_SITE_OTHER): Payer: Medicare Other | Admitting: Family Medicine

## 2016-04-15 VITALS — BP 122/60 | HR 72 | Ht 69.0 in | Wt 194.0 lb

## 2016-04-15 DIAGNOSIS — Z23 Encounter for immunization: Secondary | ICD-10-CM | POA: Diagnosis not present

## 2016-04-15 DIAGNOSIS — I10 Essential (primary) hypertension: Secondary | ICD-10-CM

## 2016-04-15 DIAGNOSIS — J309 Allergic rhinitis, unspecified: Secondary | ICD-10-CM | POA: Diagnosis not present

## 2016-04-15 DIAGNOSIS — R05 Cough: Secondary | ICD-10-CM

## 2016-04-15 DIAGNOSIS — E7800 Pure hypercholesterolemia, unspecified: Secondary | ICD-10-CM

## 2016-04-15 DIAGNOSIS — R42 Dizziness and giddiness: Secondary | ICD-10-CM

## 2016-04-15 DIAGNOSIS — R059 Cough, unspecified: Secondary | ICD-10-CM

## 2016-04-15 DIAGNOSIS — E119 Type 2 diabetes mellitus without complications: Secondary | ICD-10-CM | POA: Diagnosis not present

## 2016-04-15 LAB — POCT GLYCOSYLATED HEMOGLOBIN (HGB A1C): HEMOGLOBIN A1C: 6.4

## 2016-04-15 MED ORDER — CARVEDILOL 25 MG PO TABS: 25.0000 mg | ORAL_TABLET | Freq: Two times a day (BID) | ORAL | 1 refills | Status: DC

## 2016-04-15 MED ORDER — FLUTICASONE PROPIONATE 50 MCG/ACT NA SUSP: 2.0000 | Freq: Every day | NASAL | 5 refills | Status: DC

## 2016-04-15 MED ORDER — ATORVASTATIN CALCIUM 10 MG PO TABS: 10.0000 mg | ORAL_TABLET | Freq: Every day | ORAL | 1 refills | Status: DC

## 2016-04-15 NOTE — Patient Instructions (Signed)
  I think you should keep your appointment with Dr. Halford Chessman. We know that at some point your cough will likely come back, and this way we will have a plan.  He should be able to answer the question as to whether or not you need to be taking any inhaled medications, and what the next step would be if/when your cough returns.  Reflux is likely a contributing factor, as are your sinuses. So, if/when the cough returns do not wait very long before restarting proton pump inhibitors (ie prilosec to treat the acid reflux/hiatal hernia) as well as your allergies with flonase, zyrtec, mucinex.  We are referring you for physical therapy to help with your dizziness and balance.  Your blood pressure is very well controlled. Continue your current medications.  Your diabetes remains diet controlled--continue to limit your sweets, sugars, carbs, and try and get daily exercise.

## 2016-04-16 ENCOUNTER — Telehealth: Payer: Self-pay | Admitting: Family Medicine

## 2016-04-16 NOTE — Telephone Encounter (Signed)
Rcvd office note from Dr Radene Journey

## 2016-04-23 ENCOUNTER — Encounter: Payer: Self-pay | Admitting: Internal Medicine

## 2016-04-28 ENCOUNTER — Ambulatory Visit: Payer: Medicare Other | Admitting: Sports Medicine

## 2016-05-04 ENCOUNTER — Ambulatory Visit (INDEPENDENT_AMBULATORY_CARE_PROVIDER_SITE_OTHER): Payer: Medicare Other | Admitting: Pulmonary Disease

## 2016-05-04 ENCOUNTER — Encounter: Payer: Self-pay | Admitting: Pulmonary Disease

## 2016-05-04 VITALS — BP 128/76 | HR 64 | Ht 69.0 in | Wt 193.2 lb

## 2016-05-04 DIAGNOSIS — J45909 Unspecified asthma, uncomplicated: Secondary | ICD-10-CM | POA: Diagnosis not present

## 2016-05-04 DIAGNOSIS — R05 Cough: Secondary | ICD-10-CM | POA: Diagnosis not present

## 2016-05-04 DIAGNOSIS — J449 Chronic obstructive pulmonary disease, unspecified: Secondary | ICD-10-CM | POA: Diagnosis not present

## 2016-05-04 DIAGNOSIS — R058 Other specified cough: Secondary | ICD-10-CM

## 2016-05-04 DIAGNOSIS — R059 Cough, unspecified: Secondary | ICD-10-CM

## 2016-05-04 LAB — NITRIC OXIDE: Nitric Oxide: 26

## 2016-05-04 NOTE — Progress Notes (Signed)
Past surgical history He  has a past surgical history that includes Cardiovascular stress test (normal); Appendectomy; Vasectomy; Colonoscopy (3/08, 07/2013); and Cataract extraction, bilateral (07/2014, 08/2014).  Family history His family history includes Alcohol abuse in his father; Arthritis in his daughter and daughter; Cancer in his brother and sister; Colon cancer (age of onset: 59) in his brother; Diabetes in his maternal grandmother; Hyperlipidemia in his son and son.  Social history He  reports that he quit smoking about 41 years ago. He has never used smokeless tobacco. He reports that he drinks alcohol. He reports that he does not use drugs.  Allergies  Allergen Reactions  . Iodine Other (See Comments)    Feels like he is "burning up"    Current Outpatient Prescriptions on File Prior to Visit  Medication Sig  . atorvastatin (LIPITOR) 10 MG tablet Take 1 tablet (10 mg total) by mouth daily.  . carvedilol (COREG) 25 MG tablet Take 1 tablet (25 mg total) by mouth 2 (two) times daily.  Marland Kitchen co-enzyme Q-10 30 MG capsule Take 30 mg by mouth 3 (three) times daily. Reported on 12/22/2015  . fluticasone (FLONASE) 50 MCG/ACT nasal spray Place 2 sprays into both nostrils daily.  Marland Kitchen losartan (COZAAR) 50 MG tablet Take 50 mg by mouth daily.  . Multiple Vitamins-Minerals (MULTIVITAMIN WITH MINERALS) tablet Take 1 tablet by mouth daily. Reported on 12/22/2015  . Omega-3 Fatty Acids (FISH OIL) 1000 MG CAPS Take 1,000 mg by mouth daily. Reported on 12/22/2015  . Tiotropium Bromide Monohydrate (SPIRIVA HANDIHALER IN) Inhale into the lungs daily.   Current Facility-Administered Medications on File Prior to Visit  Medication  . incobotulinumtoxinA (XEOMIN) 50 units injection 50 Units    Chief Complaint  Patient presents with  . PULMONARY CONSULT    Self Referral for cough. Pt states that cough is resolved - states that it comes and goes for the last 15 years. Pt has been told cough is from reflux.     Tests FeNO 05/04/16 >> 26 Spirometry 05/04/16 >> FEV1 1.27 (49%), FEV1% 52  Past medical history He  has a past medical history of Adenomatous colon polyp (07/2013); Asthma (h/o as a child-s/p immunotherapy); Diabetes mellitus (diet controlled); Facial nerve spasm (08/13/2014); FHx: colon cancer; Hiatal hernia; Hypercholesteremia; Hypertension (age 87); Internal hemorrhoids (07/2013); and Restlessness and agitation (08/13/2014).  Vital signs BP 128/76 (BP Location: Left Arm, Cuff Size: Normal)   Pulse 64   Ht 5\' 9"  (1.753 m)   Wt 193 lb 3.2 oz (87.6 kg)   SpO2 98%   BMI 28.53 kg/m   History of present illness Peter [name] is a 82 y.o. male with cough.  His cough started in April 2017.  He was on ACE inhibitor and this was stopped.  There was concern for rhinitis with post-nasal drip.  He was given zpak, flonase, nasal irrigation, and zyrtec.  He was tried on tessalon for cough.  He had chest xray in May 2017 >> no acute disease process.  He quit smoking decades ago.  He was also given short course of prednisone by PCP >> didn't help.  He was seen by Dr. Lucia Gaskins with ENT.  Given zpak again with longer course of prednisone.  He felt better after this.    He gets cough every November and February.  He used to have terrible problems with asthma as a child.  He gets seasonal allergies.  He uses flonase and zyrtec on regular basis.  He denies skin rash,  or joint swelling.  He does not usually bring up much sputum or get wheezing.  No problem using aspirin.  He has hx of hiatal hernia, but is not aware of significant reflux symptoms.  He is from [place] originally, but has lived in [place] since the 1970's.  He has a Programmer, systems.  No history of pneumonia or tuberculosis.  He worked with [employer] in [employer].  Physical exam  General - No distress ENT - No sinus tenderness, no oral exudate, no LAN, no thyromegaly, TM clear, pupils equal/reactive Cardiac - s1s2 regular, no murmur, pulses  symmetric Chest - No wheeze/rales/dullness, good air entry, normal respiratory excursion Back - No focal tenderness Abd - Soft, non-tender, no organomegaly, + bowel sounds Ext - No edema Neuro - Normal strength, cranial nerves intact Skin - No rashes Psych - Normal mood, and behavior   CMP Latest Ref Rng & Units 01/06/2016 12/18/2014 08/13/2014  Glucose 65 - 99 mg/dL 112(H) 127(H) 131(H)  BUN 7 - 25 mg/dL 21 19 22   Creatinine 0.70 - 1.11 mg/dL 1.07 1.03 1.08  Sodium 135 - 146 mmol/L 139 138 141  Potassium 3.5 - 5.3 mmol/L 4.7 4.5 5.4(H)  Chloride 98 - 110 mmol/L 105 102 102  CO2 20 - 31 mmol/L 27 26 24   Calcium 8.6 - 10.3 mg/dL 8.5(L) 9.1 9.4  Total Protein 6.1 - 8.1 g/dL 6.1 6.8 7.0  Total Bilirubin 0.2 - 1.2 mg/dL 0.6 0.9 0.5  Alkaline Phos 40 - 115 U/L 57 58 69  AST 10 - 35 U/L 23 22 20   ALT 9 - 46 U/L 22 22 17      CBC Latest Ref Rng & Units 01/06/2016 12/18/2014 11/28/2013  WBC 4.0 - 10.5 K/uL 7.0 6.4 5.4  Hemoglobin 13.2 - 17.1 g/dL 12.3(L) 13.5 14.0  Hematocrit 38.5 - 50.0 % 37.3(L) 40.1 40.4  Platelets 140 - 400 K/uL 188 246 233     Discussion He has recurrent cough.  This has a seasonal component.  I suspect this is related to allergies and post-nasal drip.  He also has history of hiatal hernia, and reflux might be a contributor but seems less prominent.  He has mild elevation in FeNO today.  He did also have obstructive component on spirometry today, but is not currently having symptoms.  Assessment/plan  Upper airway cough syndrome. - continue nasal irrigation and flonase - advised him to try limiting use of anti-histamines to avoid tachphylaxis - might need further allergy assessment if his cough recurs  COPD with asthma. - might also need trial of inhaler therapy for asthma if his cough recurs >> defer for now  Hx of hiatal hernia. - f/u with PCP   Patient Instructions  Follow up in 4 months     Chesley Mires, MD [facility] Pager:  [phone] 05/04/2016, 10:26 AM

## 2016-05-04 NOTE — Progress Notes (Signed)
   Subjective:    Patient ID: Peter [name], male    DOB: 10-17-1929, 82 y.o.   MRN: [id number]  HPI    Review of Systems  Constitutional: Negative for fever and unexpected weight change.  HENT: Negative for congestion, dental problem, ear pain, nosebleeds, postnasal drip, rhinorrhea, sinus pressure, sneezing, sore throat and trouble swallowing.   Eyes: Negative for redness and itching.  Respiratory: Positive for cough (comes and goes). Negative for chest tightness, shortness of breath and wheezing.   Cardiovascular: Negative for palpitations and leg swelling.  Gastrointestinal: Negative for nausea and vomiting.       Acid heartburn // Indigestion  Genitourinary: Negative for dysuria.  Musculoskeletal: Negative for joint swelling.  Skin: Negative for rash.  Neurological: Negative for headaches.  Hematological: Does not bruise/bleed easily.  Psychiatric/Behavioral: Negative for dysphoric mood. The patient is not nervous/anxious.        Objective:   Physical Exam        Assessment & Plan:

## 2016-05-04 NOTE — Patient Instructions (Signed)
Follow up in 4 months 

## 2016-05-07 ENCOUNTER — Ambulatory Visit: Payer: Medicare Other | Attending: Family Medicine | Admitting: Rehabilitative and Restorative Service Providers"

## 2016-05-07 DIAGNOSIS — R2681 Unsteadiness on feet: Secondary | ICD-10-CM

## 2016-05-07 DIAGNOSIS — M6281 Muscle weakness (generalized): Secondary | ICD-10-CM | POA: Diagnosis not present

## 2016-05-07 DIAGNOSIS — R2689 Other abnormalities of gait and mobility: Secondary | ICD-10-CM

## 2016-05-07 NOTE — Therapy (Signed)
[facility] [address] [place], [place], [address] Phone: [phone]   Fax:  [phone]  Physical Therapy Evaluation  Patient Details  Name: Peter [name] MRN: [id number] Date of Birth: [birth year] Referring Provider: Rita Ohara, MD  Encounter Date: 05/07/2016      PT End of Session - 05/07/16 1609    Visit Number 1   Number of Visits 8   Date for PT Re-Evaluation 06/06/16   Authorization Type G code every 10th visit   PT Start Time 1505   PT Stop Time 1550   PT Time Calculation (min) 45 min   Activity Tolerance Patient tolerated treatment well   Behavior During Therapy [facility] for tasks assessed/performed      Past Medical History:  Diagnosis Date  . Adenomatous colon polyp 07/2013  . Asthma h/o as a child-s/p immunotherapy  . Diabetes mellitus diet controlled  . 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 FHx: colon cancer   . Hiatal hernia   . Hypercholesteremia   . Hypertension age 68  . Internal hemorrhoids 07/2013   noted on colonoscopy  . Restlessness and agitation 08/13/2014    Past Surgical History:  Procedure Laterality Date  . APPENDECTOMY    . CARDIOVASCULAR STRESS TEST  normal  . CATARACT EXTRACTION, BILATERAL  07/2014, 08/2014  . COLONOSCOPY  3/08, 07/2013   no repeat rec due to age per [name]  . VASECTOMY      There were no vitals filed for this visit.       Subjective Assessment - 05/07/16 1508    Subjective The patient reports "I have difficulty walking a straight line."  He had cateracts surgery and eyes are "not right" reporting having issues with focusing.  He reports his eyelids are getting into his vision line.   He feels he is constantly having to work to balance with his toes and feels generally unsteady.  He has difficulty in busier environments when visually stimulating surround.  If he turns quickly, he notes falling that direction.     Patient Stated Goals "have someone say  'yes' it is vertigo, or see if my eyes have something to do with this"   Currently in Pain? No/denies            [facility] PT Assessment - 05/07/16 1512      Assessment   Medical Diagnosis vertigo, imbalance   Referring Provider Rita Ohara, MD   Onset Date/Surgical Date --  one year ago   Prior Therapy none     Precautions   Precautions Fall     Restrictions   Weight Bearing Restrictions No     Balance Screen   Has the patient fallen in the past 6 months No  1 time 2 years ago, none recently.   Has the patient had a decrease in activity level because of a fear of falling?  No   Is the patient reluctant to leave their home because of a fear of falling?  No     Home Environment   Living Environment Private residence   Living Arrangements Spouse/significant other   [place] to enter   Entrance Stairs-Rails Right   [place] One level   [place] None     Prior Function   Level of [place] Retired     Observation/Other Assessments   Focus on Therapeutic Outcomes (FOTO)  58%   Dizziness Handicap Inventory ([place])  28%  ROM / Strength   AROM / PROM / Strength AROM;Strength     AROM   Overall AROM  Within functional limits for tasks performed     Strength   Overall Strength Comments 5/5 throughout bilateral LEs for hip flexion, knee flexion/extension, ankle DF.  Patient has 3+/5 MMT bilateral hip abduction --can also observe via trendelenberg gait.      Ambulation/Gait   Ambulation/Gait Yes   Gait velocity 4.59 ft/sec   Gait Comments trendelenberg gait     Standardized Balance Assessment   Standardized Balance Assessment Berg Balance Test     Berg Balance Test   Sit to Stand Able to stand without using hands and stabilize independently   Standing Unsupported Able to stand safely 2 minutes   Sitting with Back Unsupported but Feet Supported on Floor or Stool Able to sit safely and securely 2 minutes   Stand to Sit Sits  safely with minimal use of hands   Transfers Able to transfer safely, minor use of hands   Standing Unsupported with Eyes Closed Able to stand 3 seconds   Standing Ubsupported with Feet Together Able to place feet together independently and stand for 1 minute with supervision   From Standing, Reach Forward with Outstretched Arm Can reach confidently >25 cm (10")   From Standing Position, Pick up Object from Floor Able to pick up shoe safely and easily   From Standing Position, Turn to Look Behind Over each Shoulder Turn sideways only but maintains balance   Turn 360 Degrees Able to turn 360 degrees safely but slowly   Standing Unsupported, Alternately Place Feet on Step/Stool Able to complete 4 steps without aid or supervision   Standing Unsupported, One Foot in Front Able to take small step independently and hold 30 seconds   Standing on One Leg Unable to try or needs assist to prevent fall   Total Score 41   Berg comment: 41/56 indicating fall risk.            Vestibular Assessment - 05/07/16 1515      Vestibular Assessment   General Observation Denies sensation of room spinning; no nausea.     Symptom Behavior   Type of Dizziness Imbalance  has to consciously work to focus   Frequency of Dizziness daily   Duration of Dizziness intermittent x seconds/ depends on situational   Aggravating Factors Activity in general   Relieving Factors Head stationary     Occulomotor Exam   Occulomotor Alignment Abnormal  L eye hypertropia   Spontaneous Absent   Gaze-induced Absent   Smooth Pursuits Intact   Saccades Intact     Vestibulo-Occular Reflex   VOR Cancellation Normal   Comment Head impulse test=positive bilaterally for small amplitude refixation saccade larger on the left than the right.     Visual Acuity   Dynamic 4 line difference static versus dynamic visual acuity with dizziness mild.     Positional Testing   Sidelying Test Sidelying Right;Sidelying Left   Horizontal  Canal Testing Horizontal Canal Right;Horizontal Canal Left     Sidelying Right   Sidelying Right Duration none   Sidelying Right Symptoms No nystagmus     Sidelying Left   Sidelying Left Duration none   Sidelying Left Symptoms No nystagmus     Horizontal Canal Right   Horizontal Canal Right Duration none   Horizontal Canal Right Symptoms Normal     Horizontal Canal Left   Horizontal Canal Left Duration none   Horizontal  Canal Left Symptoms Normal               [facility] Adult PT Treatment/Exercise - 05/07/16 1512      Neuro Re-ed    Neuro Re-ed Details  corner balance exercises with foam with eyes closed for HEP; also performed partial heel/toe without significant challenge.   Also performed single limb stance near support surface.     Exercises   Exercises Knee/Hip     Knee/Hip Exercises: Sidelying   Hip ABduction Strengthening;Right;Left;10 reps         Vestibular Treatment/Exercise - 05/07/16 1607      Vestibular Treatment/Exercise   Vestibular Treatment Provided Gaze   Gaze Exercises X1 Viewing Horizontal     X1 Viewing Horizontal   Foot Position standing feet apart   Comments cues for correct technique performing gaze adaptation.                 PT Short Term Goals - 05/07/16 1610      PT SHORT TERM GOAL #1   Title STGs=LTGs           PT Long Term Goals - 05/07/16 1610      PT LONG TERM GOAL #1   Title The patient will be indep with HEP for post d/c wellness and progression of balance activities.   Baseline Target date 06/06/2016   Time 4   Period Weeks     PT LONG TERM GOAL #2   Title The patient will improve DHI from 28% to < or equal to 12%.   Baseline Target date 06/06/2016   Time 4   Period Weeks     PT LONG TERM GOAL #3   Title The patient will improve Berg from 41/56 to > or equal to 48/56 to demo improving functional balance and dec'd fall risk.   Baseline Target date 06/06/2016   Time 4   Period Weeks     PT LONG  TERM GOAL #4   Title The patient will improve hip abduction strength to > or equal to 4/5 bilaterally.   Baseline Target date 06/06/2016   Time 4   Period Weeks     PT LONG TERM GOAL #5   Title The patient will improve single leg stance to > or equal to 4 seconds bilaterally for improved stance control of gait.   Baseline Target date 06/06/2016   Time 4   Period Weeks               Plan - 05/07/16 1614    Clinical Impression Statement The patient is an 82 yo male presenting to OP therapy with multi-factorial balance impairment.  He does not present with true vertigo, however does demo weakness in use of inner ear for balance per abnormal VOR for static vs dynamic visual acuity and positive head impulse test.  He has motor weakness in bilateral hip abductors.  He also has diminished performance on balance measures scoring 41/56 on Berg balance test.  PT to address deficits and optimize functional mobility.    Rehab Potential Good   PT Duration 4 weeks   PT Treatment/Interventions ADLs/Self Care Home Management;Neuromuscular re-education;Balance training;Therapeutic activities;Therapeutic exercise;Gait training;Functional mobility training;Patient/family education;Vestibular   PT Next Visit Plan Check HEP, progress balance exercises.    Consulted and Agree with Plan of Care Patient      Patient will benefit from skilled therapeutic intervention in order to improve the following deficits and impairments:  Abnormal gait, Decreased balance, Decreased strength,  Difficulty walking, Dizziness  Visit Diagnosis: Other abnormalities of gait and mobility - Plan: PT plan of care cert/re-cert  Muscle weakness (generalized) - Plan: PT plan of care cert/re-cert  Unsteadiness on feet - Plan: PT plan of care cert/re-cert      G-Codes - [id number]    Functional Assessment Tool Used DHI=28%   Functional Limitation Self care   Self Care Current Status [id number]) At least 20 percent but less  than 40 percent impaired, limited or restricted   Self Care Goal Status [id number]) At least 1 percent but less than 20 percent impaired, limited or restricted       Problem List Patient Active Problem List   Diagnosis Date Noted  . Rotator cuff tear arthropathy of right shoulder 04/14/2016  . Hemifacial spasm 02/12/2016  . Abnormality of gait 02/12/2016  . Increased prostate specific antigen (PSA) velocity 01/07/2016  . Type 2 diabetes mellitus without complication ([place]) [id number]  . Full thickness rotator cuff tear, Left Shoulder 12/03/2014  . Injury of left shoulder 08/22/2014  . 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 Restlessness and agitation 08/13/2014  . Allergic rhinitis 11/22/2012  . Chest tightness 11/07/2012  . Type II or unspecified type diabetes mellitus without mention of complication, not stated as uncontrolled 01/21/2011  . Pure hypercholesterolemia 01/21/2011  . Essential hypertension, benign 01/21/2011  . Erectile dysfunction 01/21/2011    Damarian Priola, PT 05/07/2016, 4:31 PM  [facility] [address] [place], [place], [address] Phone: [phone]   Fax:  [phone]  Name: Peter [name] MRN: [id number] Date of Birth: Jul 17, 1930

## 2016-05-07 NOTE — Patient Instructions (Signed)
Abduction    Lift leg up toward ceiling *only 1/2 way up*. Return. No weight to begin. Repeat __10__ times each leg. Do __2__ sessions per day.  http://gt2.exer.us/385   Copyright  VHI. All rights reserved.  Gaze Stabilization: Tip Card 1.Target must remain in focus, not blurry, and appear stationary while head is in motion. 2.Perform exercises with small head movements (45 to either side of midline). 3.Increase speed of head motion so long as target is in focus. 4.If you wear eyeglasses, be sure you can see target through lens (therapist will give specific instructions for bifocal / progressive lenses). 5.These exercises may provoke dizziness or nausea. Work through these symptoms. If too dizzy, slow head movement slightly. Rest between each exercise. 6.Exercises demand concentration; avoid distractions. 7.For safety, perform standing exercises close to a counter, wall, corner, or next to someone.  Copyright  VHI. All rights reserved.  Gaze Stabilization: Standing Feet Apart   Feet shoulder width apart, keeping eyes on target on wall 3 feet away, tilt head down slightly and move head side to side for 30 seconds. lDo 3 sessions per day. Do with glasses on and glasses off.   Copyright  VHI. All rights reserved.   Feet Together (Compliant Surface) Varied Arm Positions - Eyes Closed    Stand on compliant surface: __pillow______ with feet together and arms at your side. Close eyes and visualize upright position. Hold__30__ seconds. Repeat _3___ times per session. Do __2__ sessions per day.  Copyright  VHI. All rights reserved.   SINGLE LIMB STANCE    Stance: single leg on floor. Raise leg. Hold _10__ seconds. Repeat with other leg. *Hold onto countertop to begin. _3__ reps per set, _2__ sets per day.  Copyright  VHI. All rights reserved.

## 2016-05-18 ENCOUNTER — Telehealth: Payer: Self-pay

## 2016-05-18 NOTE — Telephone Encounter (Signed)
According to the last note, it looks like he is only taking 1/2 tablet of the 50mg  losartan.  Please check with patient and see if he would prefer me to change to the 25mg  tablet so that he doesn't need to cut them in half (in which case, okay to rx 25mg  #90 with 1 refill).  If prefers to cut them in half, then directions are 50mg  tablet, 1/2 tablet daily #45 with 1 refill.

## 2016-05-18 NOTE — Telephone Encounter (Signed)
[facility] faxed the office requesting 90 day supply of losartan 50mg  tablets to be faxed to their office. Victorino December

## 2016-05-20 MED ORDER — LOSARTAN POTASSIUM 25 MG PO TABS: 25.0000 mg | ORAL_TABLET | Freq: Every day | ORAL | 1 refills | Status: DC

## 2016-05-20 NOTE — Telephone Encounter (Signed)
Spoke with pt- he reports he is taking 25mg  of Losartan a day, and he is agreeable to calling in the 25mg  1 tablet daily. rx sent.

## 2016-05-27 ENCOUNTER — Ambulatory Visit: Payer: Medicare Other | Admitting: Rehabilitative and Restorative Service Providers"

## 2016-06-03 ENCOUNTER — Encounter: Payer: Medicare Other | Admitting: Rehabilitative and Restorative Service Providers"

## 2016-06-16 DIAGNOSIS — H02105 Unspecified ectropion of left lower eyelid: Secondary | ICD-10-CM | POA: Diagnosis not present

## 2016-06-16 DIAGNOSIS — H02403 Unspecified ptosis of bilateral eyelids: Secondary | ICD-10-CM | POA: Diagnosis not present

## 2016-07-05 ENCOUNTER — Telehealth: Payer: Self-pay | Admitting: Neurology

## 2016-07-05 DIAGNOSIS — H02834 Dermatochalasis of left upper eyelid: Secondary | ICD-10-CM | POA: Diagnosis not present

## 2016-07-05 DIAGNOSIS — H02831 Dermatochalasis of right upper eyelid: Secondary | ICD-10-CM | POA: Diagnosis not present

## 2016-07-05 DIAGNOSIS — H0279 Other degenerative disorders of eyelid and periocular area: Secondary | ICD-10-CM | POA: Diagnosis not present

## 2016-07-05 NOTE — Telephone Encounter (Signed)
Pt called to r/s botox appt. Notes show [facility] pending. Pt was advised Andee Poles would call only if there were any questions. He understood

## 2016-07-06 NOTE — Telephone Encounter (Signed)
Peter [name] M/[facility] [phone] called to advise that per BC/BS [place] part D [phone] you can call to do a PA whitformulary exception. She was advised it won't take long to get it done.

## 2016-07-07 ENCOUNTER — Encounter: Payer: Self-pay | Admitting: Neurology

## 2016-07-07 ENCOUNTER — Ambulatory Visit (INDEPENDENT_AMBULATORY_CARE_PROVIDER_SITE_OTHER): Payer: Medicare Other | Admitting: Neurology

## 2016-07-07 ENCOUNTER — Ambulatory Visit: Payer: Self-pay | Admitting: Neurology

## 2016-07-07 VITALS — BP 152/72 | HR 59 | Ht 69.0 in | Wt 188.5 lb

## 2016-07-07 DIAGNOSIS — G513 Clonic hemifacial spasm: Secondary | ICD-10-CM | POA: Diagnosis not present

## 2016-07-07 DIAGNOSIS — G5139 Clonic hemifacial spasm, unspecified: Secondary | ICD-10-CM

## 2016-07-07 MED ORDER — INCOBOTULINUMTOXINA 50 UNITS IM SOLR
50.0000 [IU] | INTRAMUSCULAR | Status: DC
  Administered 2016-07-07: 50 [IU] via INTRAMUSCULAR

## 2016-07-07 NOTE — Progress Notes (Signed)
PATIENT: Peter [name] DOB: 07/12/1930  Chief Complaint  Patient presents with  . Hemifacial Spasm    Xeomin 50 units - office supply     HISTORICAL  Peter [name] is 82 years old right-handed male, accompanied by his wife, seen in refer by Dr. Brett Fairy for evaluation of EMG guided botulism toxin injection for right hemifacial spasm  I reviewed and summarized his medical history, he had a history of hypertension hyperlipidemia, bilateral cataract surgery, around 2015, he noticed right facial muscle spasm, initially mainly involving muscle around his right eye, over the past few years, gradually getting worse, more frequent, occasionally blocking his right vision, also spreading to right cheek, right upper mouth corner, is present 25% daytime, he also has bilateral senior ptosis, occasionally blocking upper vision, was suggested bilateral blepharoplasty by his ophthalmologist.   He denies a history of right facial palsy, no visual loss, no hearing loss,  He complains of chronic cough for few months, He also complains of chronic low back pain, knee pain, mild gait abnormality.  Update April 01 2016:  We have personally reviewed MRI of brain in July 2017, there is evidence of generalized atrophy, supratentorium small vessel disease, no acute abnormalities.  We reviewed that he has vascular risk factor of aging, hypertension, hyperlipidemia, he should take baby aspirin daily.  Today is his first EMG guided Xeomin injection for right hemifacial spasm, all the questions were answered, potential side effect was explained  UPDATE Jul 07 2016: He responded very well to previous EMG guided Xeomin injection in August 2017 for right hemifacial spasm, the same time he was evaluated by ophthalmologist, is planning on to have right blepharoplasty for redundant tissue at the right upper eyelid  REVIEW OF SYSTEMS: Full 14 system review of systems performed and notable only for as  above ALLERGIES: Allergies  Allergen Reactions  . Iodine Other (See Comments)    Feels like he is "burning up"    HOME MEDICATIONS: Current Outpatient Prescriptions  Medication Sig Dispense Refill  . atorvastatin (LIPITOR) 10 MG tablet Take 1 tablet (10 mg total) by mouth daily. 90 tablet 1  . carvedilol (COREG) 25 MG tablet Take 1 tablet (25 mg total) by mouth 2 (two) times daily. 180 tablet 1  . co-enzyme Q-10 30 MG capsule Take 30 mg by mouth 3 (three) times daily. Reported on 12/22/2015    . fluticasone (FLONASE) 50 MCG/ACT nasal spray Place 2 sprays into both nostrils daily. 32 g 5  . losartan (COZAAR) 25 MG tablet Take 1 tablet (25 mg total) by mouth daily. 90 tablet 1  . Multiple Vitamins-Minerals (MULTIVITAMIN WITH MINERALS) tablet Take 1 tablet by mouth daily. Reported on 12/22/2015    . Omega-3 Fatty Acids (FISH OIL) 1000 MG CAPS Take 1,000 mg by mouth daily. Reported on 12/22/2015     Current Facility-Administered Medications  Medication Dose Route Frequency Provider Last Rate Last Dose  . incobotulinumtoxinA (XEOMIN) 50 units injection 50 Units  50 Units Intramuscular Q90 days [name], MD   50 Units at 04/01/16 1424    PAST MEDICAL HISTORY: Past Medical History:  Diagnosis Date  . Adenomatous colon polyp 07/2013  . Asthma h/o as a child-s/p immunotherapy  . Diabetes mellitus diet controlled  . 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 FHx: colon cancer   . Hiatal hernia   . Hypercholesteremia   . Hypertension age 32  . Internal hemorrhoids 07/2013   noted on colonoscopy  . Restlessness and  agitation 08/13/2014    PAST SURGICAL HISTORY: Past Surgical History:  Procedure Laterality Date  . APPENDECTOMY    . CARDIOVASCULAR STRESS TEST  normal  . CATARACT EXTRACTION, BILATERAL  07/2014, 08/2014  . COLONOSCOPY  3/08, 07/2013   no repeat rec due to age per [name]  . VASECTOMY      FAMILY HISTORY: Family History  Problem Relation Age of Onset  . Alcohol abuse Father     . Cancer Sister     stomach/?pancreatic  . Colon cancer Brother 6  . Cancer Brother     colon cancer in mid 66's  . Arthritis Daughter     psoriatic  . Hyperlipidemia Son   . Arthritis Daughter     psoriatic  . Hyperlipidemia Son   . Diabetes Maternal Grandmother   . Stroke Neg Hx   . Heart disease Neg Hx     SOCIAL HISTORY:  Social History   Social History  . Marital status: Married    Spouse name: N/A  . Number of children: 8  . Years of education: N/A   Occupational History  . 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 Smoking status: Former Smoker    Quit date: 08/09/1974  . Smokeless tobacco: Never Used  . Alcohol use 0.0 oz/week     Comment: 1-2 drinks daily, sometimes none  . Drug use: No  . Sexual activity: Yes    Partners: Female   Other Topics Concern  . Not on file   Social History Narrative   Lives with wife, dog.  Children live in [place], [place], [place], [place], and 2 daughters here in [place], [place]. 15 grandchildren, 5 great-grandchildren   Patient is right handed.  And drinks caffeine daily.     PHYSICAL EXAM   Vitals:   07/07/16 1500  BP: (!) 152/72  Pulse: (!) 59  Weight: 188 lb 8 oz (85.5 kg)  Height: 5\' 9"  (1.753 m)    Not recorded      Body mass index is 27.84 kg/m.  PHYSICAL EXAMNIATION:  Gen: NAD, conversant, well nourised, obese, well groomed                     Cardiovascular: Regular rate rhythm, no peripheral edema, warm, nontender. Eyes: Conjunctivae clear without exudates or hemorrhage Neck: Supple, no carotid bruise. Pulmonary: Clear to auscultation bilaterally   NEUROLOGICAL EXAM:  MENTAL STATUS: Speech:    Speech is normal; fluent and spontaneous with normal comprehension.  Cognition:     Orientation to time, place and person     Normal recent and remote memory     Normal Attention span and concentration     Normal Language, naming, repeating,spontaneous speech     Fund of knowledge    CRANIAL NERVES: CN II: Visual fields are full to confrontation. Fundoscopic exam is normal with sharp discs and no vascular changes. Pupils are round equal and briskly reactive to light. CN III, IV, VI: extraocular movement are normal. No ptosis. CN V: Facial sensation is intact to pinprick in all 3 divisions bilaterally. Corneal responses are intact.  CN VII: He has occasionally right hemifacial muscle spasm, mainly involving right orbicularis oculi, occasional involvement of right cheek, upper mouth area CN VIII: Hearing is normal to rubbing fingers CN IX, X: Palate elevates symmetrically. Phonation is normal. CN XI: Head turning and shoulder shrug are intact CN XII: Tongue is midline with normal movements and no  atrophy.  MOTOR: There is no pronator drift of out-stretched arms. Muscle bulk and tone are normal. Muscle strength is normal.  REFLEXES: Reflexes are 2+ and symmetric at the biceps, triceps, knees, and ankles. Plantar responses are flexor.  SENSORY: Intact to light touch, pinprick, positional sensation and vibratory sensation are intact in fingers and toes.  COORDINATION: Rapid alternating movements and fine finger movements are intact. There is no dysmetria on finger-to-nose and heel-knee-shin.    GAIT/STANCE: He needs push up to get up from seated position, mildly limp, unsteady   DIAGNOSTIC DATA (LABS, IMAGING, TESTING) - I reviewed patient records, labs, notes, testing and imaging myself where available.   ASSESSMENT AND PLAN  Danelle Sandstrom Donnally is a 82 y.o. male   Right hemifacial spasm  EMG guided xeomin injection, 50 units unit, used 32.5 units, discard 17.5 units  Right orbicularis oculi at 2,4, 6, 8, 9, 10 clock, 2.5 units each= 15 units Right corrugate 2.5 units Left corrugate 2.5 units Procerus 2.5 units  Right frontalis 5 units into injection sites    Left orbicularis oculi at 3, 4:00, 2.5 units each= 5 units  Cerebral Small vessel  disease  He has vascular risk factor of aging, hypertension, hyperlipidemia  Have advised him start aspirin 81mg  daily   [name], M.D. Ph.D.  [facility] [address] [place], [place] [address] Ph: [phone] Fax: [phone]  CC: Rita Ohara, MD

## 2016-07-07 NOTE — Addendum Note (Signed)
Addended by: [name] on: 07/07/2016 03:32 PM   Modules accepted: Orders

## 2016-07-07 NOTE — Progress Notes (Signed)
**  Xeomin 50 units x 1 vial, Lot [id number], Exp 04/2017, [place] [id number], office supply.//mck,rn**

## 2016-07-07 NOTE — Telephone Encounter (Signed)
Spoke with someone from this office, since this will interfere with patient care we are going to process this patients xeomin as b/b to prevent his apt from being canceled. I will call and cancel this order.

## 2016-07-08 ENCOUNTER — Ambulatory Visit: Payer: Medicare Other | Admitting: Neurology

## 2016-07-15 NOTE — Telephone Encounter (Signed)
Peter [name]/[facility] [phone] called said she is faxing [phone]) form and is requesting it to be sent to [facility] asap

## 2016-09-01 DIAGNOSIS — H0230 Blepharochalasis unspecified eye, unspecified eyelid: Secondary | ICD-10-CM | POA: Diagnosis not present

## 2016-09-01 DIAGNOSIS — H02132 Senile ectropion of right lower eyelid: Secondary | ICD-10-CM | POA: Diagnosis not present

## 2016-09-01 DIAGNOSIS — H02135 Senile ectropion of left lower eyelid: Secondary | ICD-10-CM | POA: Diagnosis not present

## 2016-09-03 ENCOUNTER — Ambulatory Visit: Payer: Medicare Other | Admitting: Pulmonary Disease

## 2016-09-08 ENCOUNTER — Encounter: Payer: Self-pay | Admitting: Family Medicine

## 2016-09-08 ENCOUNTER — Ambulatory Visit (INDEPENDENT_AMBULATORY_CARE_PROVIDER_SITE_OTHER): Payer: Medicare Other | Admitting: Family Medicine

## 2016-09-08 VITALS — BP 128/70 | HR 68 | Temp 97.6°F | Ht 69.0 in | Wt 185.8 lb

## 2016-09-08 DIAGNOSIS — R0981 Nasal congestion: Secondary | ICD-10-CM | POA: Diagnosis not present

## 2016-09-08 DIAGNOSIS — J069 Acute upper respiratory infection, unspecified: Secondary | ICD-10-CM | POA: Diagnosis not present

## 2016-09-08 MED ORDER — AZITHROMYCIN 250 MG PO TABS: ORAL_TABLET | ORAL | 0 refills | Status: DC

## 2016-09-08 NOTE — Patient Instructions (Signed)
  Drink plenty of water. Continue the flonase, 2 sprays daily. Continue mucinex to help keep secretions thin. If you continue with the tylenol medication, you should monitor your blood pressure, as it can raise blood pressure.  Coricidin HBP would be a safer choice (since you won't be monitoring blood pressure on vacation). You may want to consider taking Afrin nasal spray, to use just before the flight, to prevent any ear pain related to head congestion.  Do not use this regularly though, just for flying.  Take the z-pak only if/when you develop yellow-green mucus/phlegm. Hopefully this won't be needed. I suspect the congestion may move down into the chest.  Continue the mucinex.  Based on your history, you may end up needing to re-try the inhaler, or need steroids if the cough persists (as it sometimes has).

## 2016-09-08 NOTE — Progress Notes (Signed)
Chief Complaint  Patient presents with  . Cough    feel like he is constantly clearing his throat, not really coughing-doesnt have much drainage either. Just concerned because he is leaving Saturday morning to go to [place] 2[place]12 and would like to have some medications with him "just is case".    He leaves Saturday to go to [place], then to [place] to celebrate his 67th wedding anniversary.  His current complaints are "the same thing I get every year". Last year it lasted for 2 months; he saw Dr. Lucia Gaskins and Dr. Halford Chessman.  He was treated with z-pak and prednisone  He wants a prescription for a z-pak to take with him on his trip, just in case.  Last week he started with some postnasal drainage, throat clearing. He hasn't started coughing yet.  He denies any significant nasal congestion or runny nose.  He has had some hoarseness.  He denies any chest congestion.  He uses Flonase, restarted after about 3 days of symptoms, using only for a few days; using tylenol cold/allergy, which has also helped. He gets up some phlegm from his throat--"golfball size", gray.    PMH, PSH, SH reviewed  Outpatient Encounter Prescriptions as of 09/08/2016  Medication Sig Note  . atorvastatin (LIPITOR) 10 MG tablet Take 1 tablet (10 mg total) by mouth daily.   . carvedilol (COREG) 25 MG tablet Take 1 tablet (25 mg total) by mouth 2 (two) times daily.   Marland Kitchen co-enzyme Q-10 30 MG capsule Take 30 mg by mouth 3 (three) times daily. Reported on 12/22/2015 09/08/2016: Takes sporadically  . fluticasone (FLONASE) 50 MCG/ACT nasal spray Place 2 sprays into both nostrils daily. 09/08/2016: Restarted 3-4 days ago  . losartan (COZAAR) 25 MG tablet Take 1 tablet (25 mg total) by mouth daily.   . Multiple Vitamins-Minerals (MULTIVITAMIN WITH MINERALS) tablet Take 1 tablet by mouth daily. Reported on 12/22/2015   . Omega-3 Fatty Acids (FISH OIL) 1000 MG CAPS Take 1,000 mg by mouth daily. Reported on 12/22/2015    Facility-Administered  Encounter Medications as of 09/08/2016  Medication  . incobotulinumtoxinA (XEOMIN) 50 units injection 50 Units  . incobotulinumtoxinA (XEOMIN) 50 units injection 50 Units    ROS: No fever, chills, headaches ,dizziness, shortness of breath, chest pain, nausea, vomiting, diarrhea, bleeding, bruising, rash or other complaints.  See HPI  PHYSICAL EXAM:  BP 128/70 (BP Location: Right Arm, Patient Position: Sitting, Cuff Size: Normal)   Pulse 68   Temp 97.6 F (36.4 C) (Tympanic)   Ht 5\' 9"  (1.753 m)   Wt 185 lb 12.8 oz (84.3 kg)   BMI 27.44 kg/m    Well appearing male in no distress; sounds nasal, slightly hoarse. Some throat-clearing HEENT: PERRL, conjunctiva and sclera are clear. TM's and EAC's normal. Nasal mucosa is mild-mod edematous, R>L, clear mucus. Sinuses are nontender. OP is clear Neck: no lymphadenopathy or masses Heart: regular rate and rhythm without murmur Lungs: clear bilaterally.  Trace wheeze on first inspiration which cleared.  Good air movement Neuro: alert and oriented, normal gait.  Less twitching of the R eye than in the past (s/p some botox injections)   ASSESSMENT/PLAN:   Nasal congestion  Upper respiratory tract infection, unspecified type - cold vs allergies--no sign of infection at this time. Supportive measures reviewed, and s/sx of infection, to start z-pak if/when develops - Plan: azithromycin (ZITHROMAX) 250 MG tablet    Drink plenty of water. Continue the flonase, 2 sprays daily. Continue mucinex to help  keep secretions thin. If you continue with the tylenol medication, you should monitor your blood pressure, as it can raise blood pressure.  Coricidin HBP would be a safer choice (since you won't be monitoring blood pressure on vacation). You may want to consider taking Afrin nasal spray, to use just before the flight, to prevent any ear pain related to head congestion.  Do not use this regularly though, just for flying.  Take the z-pak only  if/when you develop yellow-green mucus/phlegm. Hopefully this won't be needed. I suspect the congestion may move down into the chest.  Continue the mucinex.  Based on your history, you may end up needing to re-try the inhaler, or need steroids if the cough persists (as it sometimes has).

## 2016-09-20 DIAGNOSIS — H53483 Generalized contraction of visual field, bilateral: Secondary | ICD-10-CM | POA: Diagnosis not present

## 2016-10-11 ENCOUNTER — Encounter: Payer: Self-pay | Admitting: Rehabilitative and Restorative Service Providers"

## 2016-10-11 NOTE — Therapy (Signed)
[facility] [address] [place], [place], [address] Phone: [phone]   Fax:  [phone]  Patient Details  Name: Peter [name] MRN: [id number] Date of Birth: [birth year] Referring Provider:  No ref. provider found  Encounter Date: last encounter 05/07/17  PHYSICAL THERAPY DISCHARGE SUMMARY  Visits from Start of Care: evaluation only  Current functional level related to goals / functional outcomes: See eval- patient did not return.   Remaining deficits: See eval for patient status.   Education / Equipment: n/a  Plan: Patient agrees to discharge.  Patient goals were not met. Patient is being discharged due to not returning since the last visit.  ?????        Thank you for the referral of this patient. Rudell Cobb, MPT  Peter [name] 10/11/2016, 10:53 AM  [facility] [address] [place], [place], [address] Phone: [phone]   Fax:  [phone]

## 2016-10-20 ENCOUNTER — Encounter: Payer: Medicare Other | Admitting: Family Medicine

## 2016-10-27 ENCOUNTER — Encounter: Payer: Medicare Other | Admitting: Family Medicine

## 2016-11-15 DIAGNOSIS — H53483 Generalized contraction of visual field, bilateral: Secondary | ICD-10-CM | POA: Diagnosis not present

## 2016-11-23 DIAGNOSIS — L570 Actinic keratosis: Secondary | ICD-10-CM | POA: Diagnosis not present

## 2016-11-23 DIAGNOSIS — Z85828 Personal history of other malignant neoplasm of skin: Secondary | ICD-10-CM | POA: Diagnosis not present

## 2016-11-29 ENCOUNTER — Telehealth: Payer: Self-pay | Admitting: *Deleted

## 2016-11-29 ENCOUNTER — Other Ambulatory Visit: Payer: Self-pay | Admitting: Family Medicine

## 2016-11-29 DIAGNOSIS — E7800 Pure hypercholesterolemia, unspecified: Secondary | ICD-10-CM

## 2016-11-29 DIAGNOSIS — I10 Essential (primary) hypertension: Secondary | ICD-10-CM

## 2016-11-29 DIAGNOSIS — Z5181 Encounter for therapeutic drug level monitoring: Secondary | ICD-10-CM

## 2016-11-29 DIAGNOSIS — R972 Elevated prostate specific antigen [PSA]: Secondary | ICD-10-CM

## 2016-11-29 DIAGNOSIS — E119 Type 2 diabetes mellitus without complications: Secondary | ICD-10-CM

## 2016-11-29 NOTE — Telephone Encounter (Signed)
Scheduled patient for med check for 12/20/16 (due for AWV 01/06/17-I put him on cx list) he wants to come in 12/17/16 for fasting labs-need orders please.

## 2016-11-29 NOTE — Telephone Encounter (Signed)
Future orders entered   (PSA done due to elevated PSA velocity--) Lab Results  Component Value Date   PSA 3.82 01/06/2016   PSA 2.70 12/18/2014   PSA 2.58 11/28/2013

## 2016-11-30 NOTE — Telephone Encounter (Signed)
Pt made aware. /RLB  

## 2016-12-09 DIAGNOSIS — L821 Other seborrheic keratosis: Secondary | ICD-10-CM | POA: Diagnosis not present

## 2016-12-09 DIAGNOSIS — L570 Actinic keratosis: Secondary | ICD-10-CM | POA: Diagnosis not present

## 2016-12-09 DIAGNOSIS — Z85828 Personal history of other malignant neoplasm of skin: Secondary | ICD-10-CM | POA: Diagnosis not present

## 2016-12-13 DIAGNOSIS — H02831 Dermatochalasis of right upper eyelid: Secondary | ICD-10-CM | POA: Diagnosis not present

## 2016-12-13 DIAGNOSIS — H02834 Dermatochalasis of left upper eyelid: Secondary | ICD-10-CM | POA: Diagnosis not present

## 2016-12-13 DIAGNOSIS — H02413 Mechanical ptosis of bilateral eyelids: Secondary | ICD-10-CM | POA: Diagnosis not present

## 2016-12-13 HISTORY — PX: BLEPHAROPLASTY: SUR158

## 2016-12-17 ENCOUNTER — Other Ambulatory Visit: Payer: Self-pay | Admitting: Family Medicine

## 2016-12-17 ENCOUNTER — Other Ambulatory Visit: Payer: Medicare Other

## 2016-12-17 DIAGNOSIS — R946 Abnormal results of thyroid function studies: Secondary | ICD-10-CM | POA: Diagnosis not present

## 2016-12-17 DIAGNOSIS — E7800 Pure hypercholesterolemia, unspecified: Secondary | ICD-10-CM

## 2016-12-17 DIAGNOSIS — E119 Type 2 diabetes mellitus without complications: Secondary | ICD-10-CM

## 2016-12-17 DIAGNOSIS — R972 Elevated prostate specific antigen [PSA]: Secondary | ICD-10-CM | POA: Diagnosis not present

## 2016-12-17 DIAGNOSIS — I10 Essential (primary) hypertension: Secondary | ICD-10-CM | POA: Diagnosis not present

## 2016-12-17 DIAGNOSIS — Z5181 Encounter for therapeutic drug level monitoring: Secondary | ICD-10-CM

## 2016-12-17 LAB — COMPREHENSIVE METABOLIC PANEL
ALBUMIN: 4.1 g/dL (ref 3.6–5.1)
ALT: 14 U/L (ref 9–46)
AST: 19 U/L (ref 10–35)
Alkaline Phosphatase: 51 U/L (ref 40–115)
BUN: 28 mg/dL — ABNORMAL HIGH (ref 7–25)
CALCIUM: 8.7 mg/dL (ref 8.6–10.3)
CHLORIDE: 105 mmol/L (ref 98–110)
CO2: 28 mmol/L (ref 20–31)
Creat: 1.23 mg/dL — ABNORMAL HIGH (ref 0.70–1.11)
GLUCOSE: 128 mg/dL — AB (ref 65–99)
Potassium: 4.5 mmol/L (ref 3.5–5.3)
SODIUM: 139 mmol/L (ref 135–146)
Total Bilirubin: 0.7 mg/dL (ref 0.2–1.2)
Total Protein: 6.2 g/dL (ref 6.1–8.1)

## 2016-12-17 LAB — CBC WITH DIFFERENTIAL/PLATELET
Basophils Absolute: 54 cells/uL (ref 0–200)
Basophils Relative: 1 %
EOS ABS: 486 {cells}/uL (ref 15–500)
Eosinophils Relative: 9 %
HEMATOCRIT: 38.6 % (ref 38.5–50.0)
Hemoglobin: 12.8 g/dL — ABNORMAL LOW (ref 13.2–17.1)
LYMPHS PCT: 21 %
Lymphs Abs: 1134 cells/uL (ref 850–3900)
MCH: 32.3 pg (ref 27.0–33.0)
MCHC: 33.2 g/dL (ref 32.0–36.0)
MCV: 97.5 fL (ref 80.0–100.0)
MONO ABS: 702 {cells}/uL (ref 200–950)
MONOS PCT: 13 %
MPV: 9.1 fL (ref 7.5–12.5)
NEUTROS PCT: 56 %
Neutro Abs: 3024 cells/uL (ref 1500–7800)
Platelets: 206 10*3/uL (ref 140–400)
RBC: 3.96 MIL/uL — AB (ref 4.20–5.80)
RDW: 13.3 % (ref 11.0–15.0)
WBC: 5.4 10*3/uL (ref 4.0–10.5)

## 2016-12-17 LAB — TSH: TSH: 6.38 mIU/L — ABNORMAL HIGH (ref 0.40–4.50)

## 2016-12-17 LAB — LIPID PANEL
CHOLESTEROL: 153 mg/dL (ref <200)
HDL: 53 mg/dL (ref >40)
LDL CALC: 78 mg/dL (ref <100)
TRIGLYCERIDES: 112 mg/dL (ref <150)
Total CHOL/HDL Ratio: 2.9 Ratio (ref <5.0)
VLDL: 22 mg/dL (ref <30)

## 2016-12-18 LAB — PSA: PSA: 2.4 ng/mL (ref <=4.0)

## 2016-12-18 LAB — HEMOGLOBIN A1C
HEMOGLOBIN A1C: 6.2 % — AB (ref <5.7)
MEAN PLASMA GLUCOSE: 131 mg/dL

## 2016-12-18 LAB — MICROALBUMIN / CREATININE URINE RATIO
CREATININE, URINE: 91 mg/dL (ref 20–370)
MICROALB UR: 0.4 mg/dL
MICROALB/CREAT RATIO: 4 ug/mg{creat} (ref <30)

## 2016-12-19 NOTE — Progress Notes (Deleted)
Patient presents for med check. He had labs done prior to visit today.  He is actually due for physical, but scheduled only for med check.  Diabetes: He has never taken medications for diabetes.Denies polydipsia, polyuria. Doesn't really watch his diet much at all. He has been eating less candy.  He doesn't monitor his blood sugars.  He had first EMG guided Xeomin injection for right hemifacial spasm on 04/01/16. He and his family have noted improvement in the eye twitching/closing. R blepharoplasty (done yet??)  He had f/u with Dr. Acie Fredrickson in July.  Elevated coronary calcium - no angina ,  He recommended continuing aggressive lipid management.Mild carotid artery disease (seen on [facility] screening 02/2014). Denies any neurologic symptoms (other than twitching as above).  He brings in paperwork recommending repeat [facility] screening (advised this wasn't necessary).  He had many questions regarding the abnormal peripheral arterial disease screen--invalid reading, not that it was abnormal.    HTN-- BP's have been well controlled, running 127-140/60's, pulse 68-70.  He does report that BP's are much better since he has been taking his med BID as previously instructed (previously had compliance issues, only took it once daily rather than BID).  At his last visit he reported getting dizzy after standing--not immediately, but when he starts to move.  He needs to fix his vision on something stationery to make the dizziness stop. He denies lightheadedness, but related to things moving in his peripheral vision, or quick head movements. He denies any falls.  He had been referred to PT back in September UPDATE   Cough--When previously seen here with cough, it was felt that allergies was a factor, poss ACEI contributing (so changed to ARB). He had taken Flonase, took prednisone 20mg  BID x 5d) which helped some but didn't resolve. Spirometry showed obstructive airway disease, but CXR didn't show COPD  changes. Symptoms didn't resolve after taking Omeprazole x 2 weeks, so then tried Spiriva sample. He picked up a sample, took about 1/2 of the sample, thought it helped a lot the first couple of days, but then he saw Dr. Lucia Gaskins and cough has resolved.Marland Kitchen He saw Dr. Lucia Gaskins twice.  He was treated with steroid taper, along with a z-pak and his cough has resolved. MRI showed sinus some chronic maxillary, ethmoid and frontal sinusitis (done mid-July). He then saw Dr. Halford Chessman in f/u in September.  His recommendations were: Upper airway cough syndrome. - continue nasal irrigation and flonase - advised him to try limiting use of anti-histamines to avoid tachphylaxis - might need further allergy assessment if his cough recurs COPD with asthma. - might also need trial of inhaler therapy for asthma if his cough recurs >> defer for now Hx of hiatal hernia. - f/u with PCP  Hyperlipidemia follow-up: Compliant with medications and denies medication side effects.   Shoulder pain--prev saw [name], doing HEP   PMH, [place], [place] reviewed.    ROS:   PHYSICAL EXAM:  Wt Readings from Last 3 Encounters:  09/08/16 185 lb 12.8 oz (84.3 kg)  07/07/16 188 lb 8 oz (85.5 kg)  05/04/16 193 lb 3.2 oz (87.6 kg)    Well developed, pleasant male in no distress. He is in good spirits. HEENT: PERRL, EOMI, conjunctiva and sclera are clear.  Nasal mucosa is mildly edematous, no drainage, no purulence or erythema.  Only very rare twitch of the right side of his face (milder, no complete eye closing--significantly improved.) Neck: no lymphadenopathy, thyromegaly or carotid bruit Heart: regular rate and  rhythm Lungs: clear bilaterally, fair air movement, no wheezes, rales, ronchi Abdomen: soft, nontender, no mass Extremities: no edema, 2+ pulses Psych: normal mood, affect, hygiene and grooming Neuro: alert and oriented, normal strength, gait.  Some asymmetry to face, rare twitch to right mouth/face/eye.       Chemistry      Component Value Date/Time   NA 139 12/17/2016 0722   NA 141 08/13/2014 1517   K 4.5 12/17/2016 0722   CL 105 12/17/2016 0722   CO2 28 12/17/2016 0722   BUN 28 (H) 12/17/2016 0722   BUN 22 08/13/2014 1517   CREATININE 1.23 (H) 12/17/2016 0722      Component Value Date/Time   CALCIUM 8.7 12/17/2016 0722   ALKPHOS 51 12/17/2016 0722   AST 19 12/17/2016 0722   ALT 14 12/17/2016 0722   BILITOT 0.7 12/17/2016 0722     Fasting glucose 128  Lab Results  Component Value Date   HGBA1C 6.2 (H) 12/17/2016   Lab Results  Component Value Date   PSA 2.4 12/17/2016   PSA 3.82 01/06/2016   PSA 2.70 12/18/2014   Lab Results  Component Value Date   WBC 5.4 12/17/2016   HGB 12.8 (L) 12/17/2016   HCT 38.6 12/17/2016   MCV 97.5 12/17/2016   PLT 206 12/17/2016   (Hgb last year was 12.3, with normal iron and B12 levels).  Lab Results  Component Value Date   TSH 6.38 (H) 12/17/2016   microalb/Cr ratio 4   ASSESSMENT/PLAN:    Shingrix   Schedule CPE/AWV

## 2016-12-20 ENCOUNTER — Encounter: Payer: Self-pay | Admitting: Family Medicine

## 2016-12-20 DIAGNOSIS — H02135 Senile ectropion of left lower eyelid: Secondary | ICD-10-CM | POA: Diagnosis not present

## 2016-12-20 DIAGNOSIS — H02132 Senile ectropion of right lower eyelid: Secondary | ICD-10-CM | POA: Diagnosis not present

## 2016-12-20 DIAGNOSIS — Z09 Encounter for follow-up examination after completed treatment for conditions other than malignant neoplasm: Secondary | ICD-10-CM | POA: Diagnosis not present

## 2016-12-20 LAB — T4, FREE: Free T4: 0.9 ng/dL (ref 0.8–1.8)

## 2016-12-20 IMAGING — CR DG CHEST 2V
2 series · 2 of 2 positions shown · non-contrast
Comparison: None.

CLINICAL DATA: Chronic cough for 2 months.

EXAM:
CHEST  2 VIEW

[view not recorded (1 of 2)]
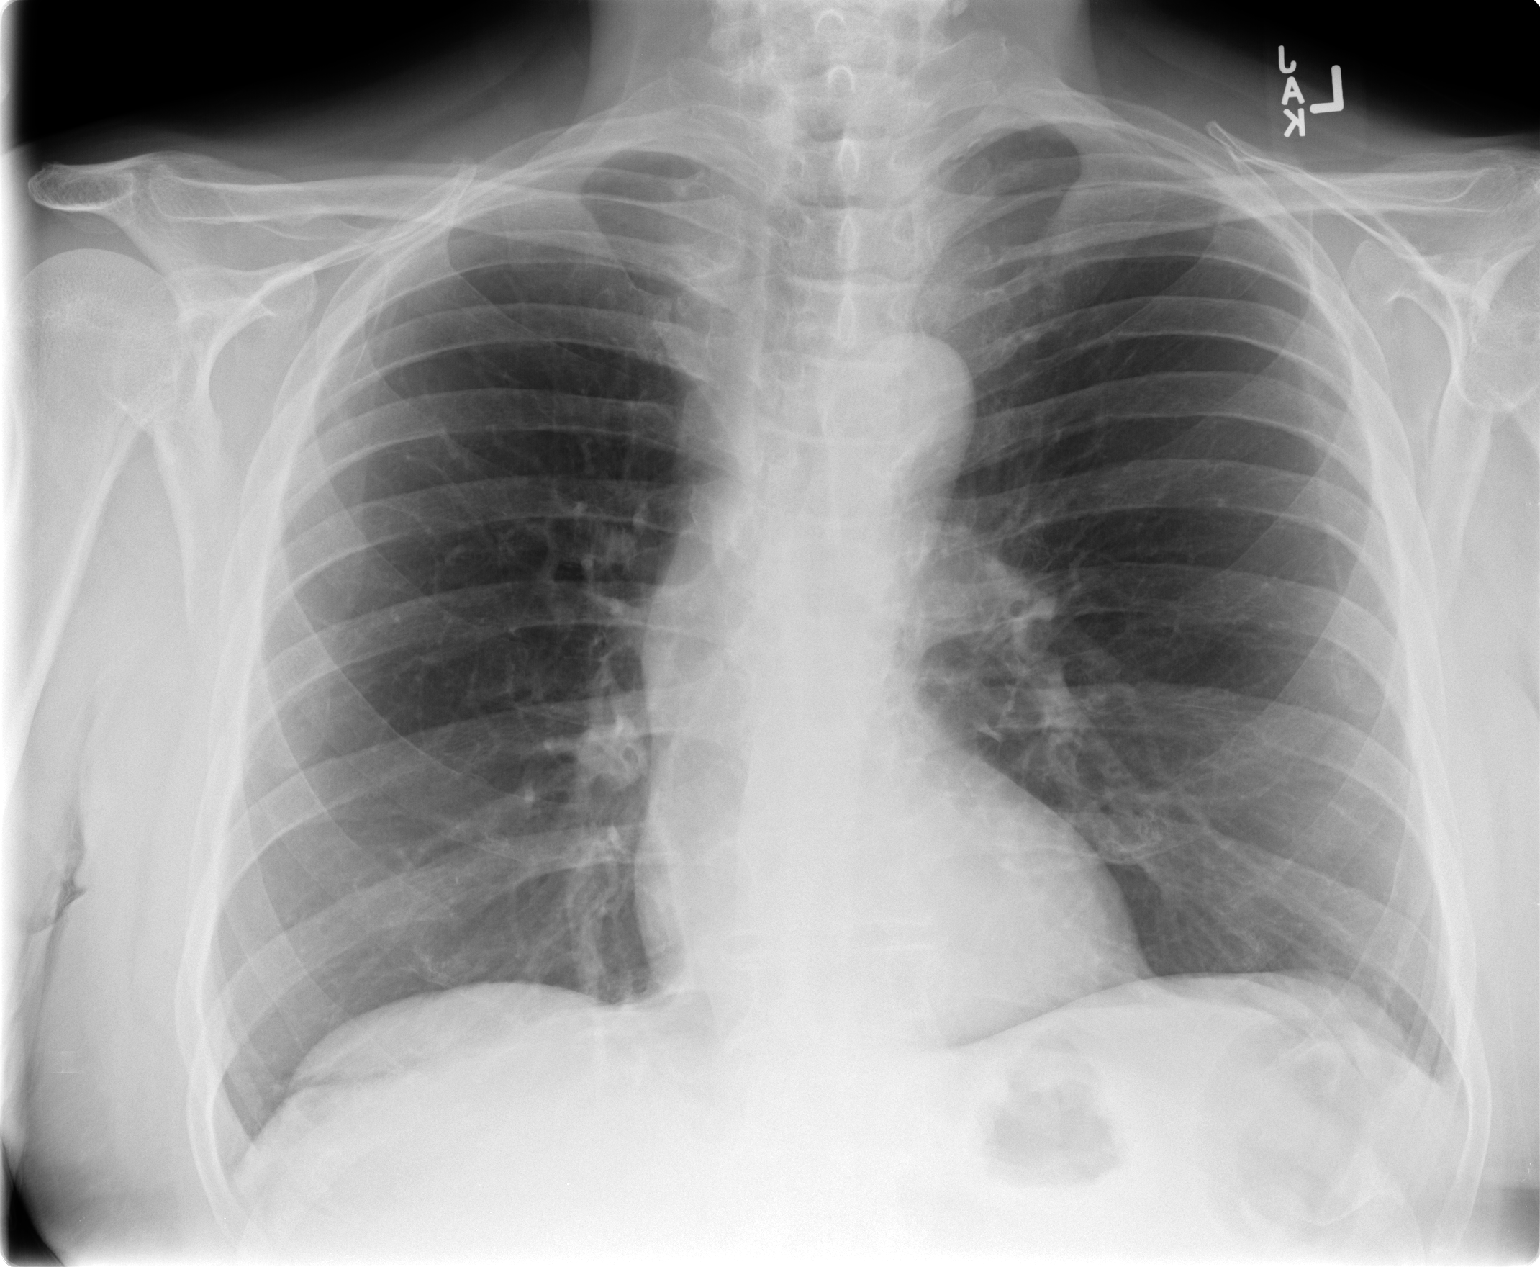

[view not recorded (2 of 2)]
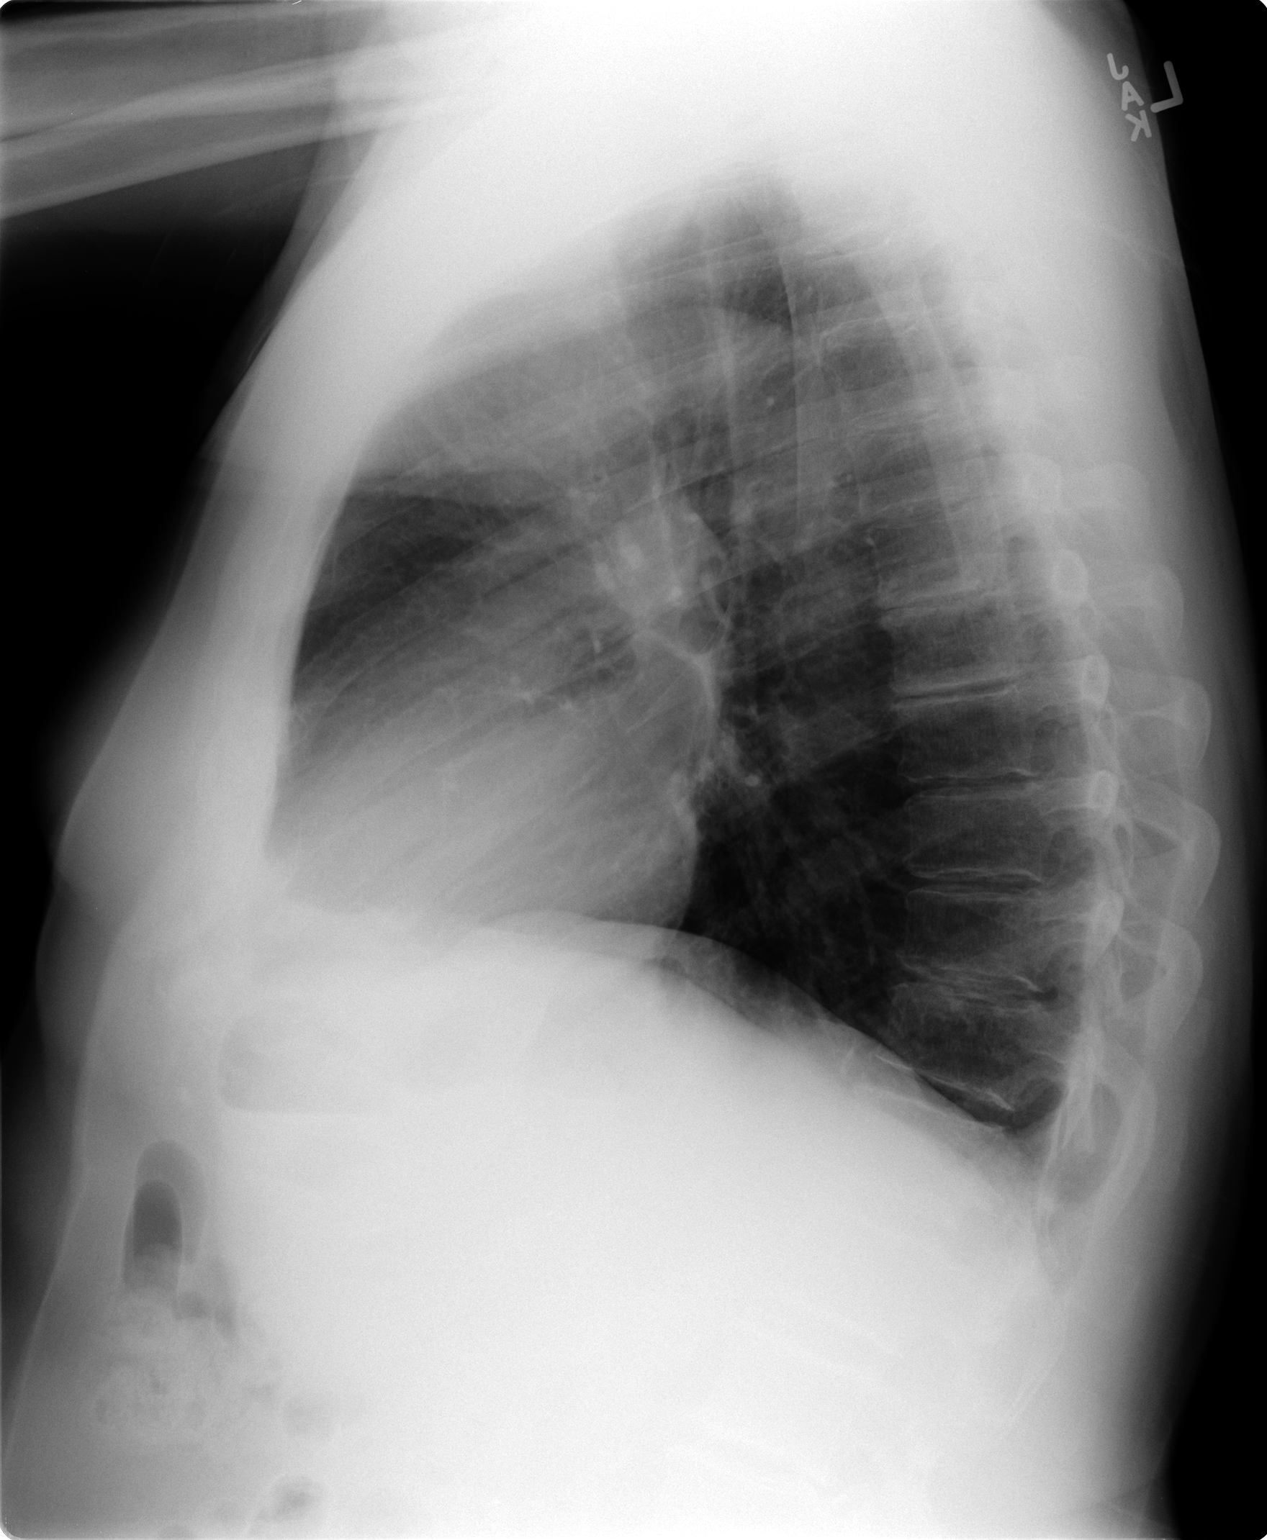

[2 of 2 positions shown; findings below may reference images not displayed]

FINDINGS: Normal heart size. Mildly atherosclerotic aortic arch. Otherwise
normal mediastinal contour. No pneumothorax. No pleural effusion.
Lungs appear clear, with no acute consolidative airspace disease and
no pulmonary edema.
IMPRESSION: No active cardiopulmonary disease.

## 2016-12-22 NOTE — Progress Notes (Signed)
Chief Complaint  Patient presents with  . Hypertension    nonfasting med check (labs already done). Sees Dr.Nahser but feels like he would like to see someone else for a second opinion (not for any particular reason but just a workup in general). He is really fatigued all the time over the past several years.   Marland Kitchen other    requesting handicapped placard due to balance issues-went to rehab twice at the request of neuro.    Patient presents for med check. He had labs done prior to visit today.  He is actually due for physical/wellness visit, but scheduled only for med check.  Diabetes: He has never taken medications for diabetes.Denies polydipsia, polyuria. Doesn't really watch his diet much at all. He has been eating less candy. He doesn't monitor his blood sugars.  He had EMG guided Xeomin injection for right hemifacial spasm, twice. Last injection was 6 months ago, and is starting to notice some twitching recurring, but not bothersome.  He had bilateral blepharoplasty 5/7.  He reports they also found an issue with tear duct, which will need to be treated next month.  He last had f/u with Dr. Acie Fredrickson in July. Elevated coronary calcium - no angina , He recommended continuingaggressive lipid management.Mild carotid artery disease (seen on [facility] screening 02/2014). Denies any neurologic symptoms  HTN-- BP's have been well controlled, running 115-145/45-60, pulse 66-78 (mostly 125-140/50, pulse 65-70.   He is complaining of a balance issue, can't walk a straight line. Neuro sent him to PT to help.  He was told his right hip was week; patient notices weakness in his feet.  He only went 2-3 times to therapy, didn't find it helpful.  If he stops and focuses on something stationary, the dizziness improves.  He thinks vision improved, no longer having the peripheral fuzziness, since having the blepharoplasty.  Still having balance issues.  He is asking for handicap placard. Some days he  walks without problems at all.He denies any falls or concern for falling.  Cough--Resolved.  When previously seen here with cough, it was felt that allergies was a factor, poss ACEI contributing (so changed to ARB). He had taken Flonase, took prednisone '20mg'$  BID x 5d)which helped some but didn't resolve. Spirometry showed obstructive airway disease, but CXR didn't show COPD changes. Symptoms didn't resolve after taking Omeprazole x 2 weeks, so then triedSpiriva sample. He picked up a sample, took about 1/2 of the sample, thought it helped a lot the first couple of days, but then he saw Dr. Lucia Gaskins and cough has resolved.Marland Kitchen He saw Dr. Lucia Gaskins twice. He was treated with steroid taper, along with a z-pak and his cough has resolved. MRI showed sinus some chronic maxillary, ethmoid and frontal sinusitis (done mid-July). He then saw Dr. Halford Chessman in f/u in September.  His recommendations were to continue flonase an nasal irrigation. To consider allergy testing, or other inhaler trials for asthma if cough recurs.  Hyperlipidemia follow-up: Compliant with medications and denies medication side effects.     PMH, PSH, SH reviewed.  Outpatient Encounter Prescriptions as of 12/23/2016  Medication Sig Note  . aspirin EC 81 MG tablet Take 81 mg by mouth daily.   Marland Kitchen atorvastatin (LIPITOR) 10 MG tablet TAKE 1 TABLET ONCE DAILY.   . carvedilol (COREG) 25 MG tablet Take 1 tablet (25 mg total) by mouth 2 (two) times daily.   . fluticasone (FLONASE) 50 MCG/ACT nasal spray Place 2 sprays into both nostrils daily.   Marland Kitchen losartan (  COZAAR) 25 MG tablet Take 1 tablet (25 mg total) by mouth daily.   . Multiple Vitamins-Minerals (MULTIVITAMIN WITH MINERALS) tablet Take 1 tablet by mouth daily. Reported on 12/22/2015   . co-enzyme Q-10 30 MG capsule Take 30 mg by mouth 3 (three) times daily. Reported on 12/22/2015 09/08/2016: Takes sporadically  . Omega-3 Fatty Acids (FISH OIL) 1000 MG CAPS Take 1,000 mg by mouth daily.  Reported on 12/22/2015   . [DISCONTINUED] azithromycin (ZITHROMAX) 250 MG tablet Take 2 tablets by mouth on first day, then 1 tablet by mouth on days 2 through 5    Facility-Administered Encounter Medications as of 12/23/2016  Medication  . incobotulinumtoxinA (XEOMIN) 50 units injection 50 Units  . incobotulinumtoxinA (XEOMIN) 50 units injection 50 Units   Allergies  Allergen Reactions  . Iodine Other (See Comments)    Feels like he is "burning up"    ROS: denies fever, chills, URI symptoms. Allergies are well controlled.  Denies cough, chest pain, dyspnea.  No longer having any shoulder pain. He has indigestion 2x/month, relieved by Tums He reports some fatiue and dry skin, no acute changes Bowels and temperature tolerance are normal. See HPI    PHYSICAL EXAM:  BP 130/70 (BP Location: Left Arm, Patient Position: Sitting, Cuff Size: Normal)   Pulse 84   Ht 5' 9"  (1.753 m)   Wt 189 lb 12.8 oz (86.1 kg)   BMI 28.03 kg/m      Wt Readings from Last 3 Encounters:  09/08/16 185 lb 12.8 oz (84.3 kg)  07/07/16 188 lb 8 oz (85.5 kg)  05/04/16 193 lb 3.2 oz (87.6 kg)    Well developed, pleasant male in no distress. He is in fair spirits. HEENT: PERRL, EOMI, conjunctiva and sclera are clear. Nasal mucosa is normal, no drainage, no purulence or erythema. Occasional twitch of the right side of his face (milder and less frequent than prior to injections). Cerumen noted on the right, clear on the left with normal TM and EAC. Neck: no lymphadenopathy, thyromegaly or carotid bruit Heart: regular rate and rhythm Lungs: clear bilaterally, fair air movement, no wheezes, rales, ronchi Abdomen: soft, nontender, no mass Extremities: no edema, 2+ pulses Psych: normal mood, affect, hygiene and grooming Neuro: alert and oriented, normal strength, gait.  Skin: normal turgor, no rash.  Ecchymosis below both eyes due to recent surgery.  Diabetic foot exam--normal monofilament exam, normal  pulses, no rash/lesions. Long toenails     Chemistry   Labs(Brief)          Component Value Date/Time   NA 139 12/17/2016 0722   NA 141 08/13/2014 1517   K 4.5 12/17/2016 0722   CL 105 12/17/2016 0722   CO2 28 12/17/2016 0722   BUN 28 (H) 12/17/2016 0722   BUN 22 08/13/2014 1517   CREATININE 1.23 (H) 12/17/2016 0722     Labs(Brief)          Component Value Date/Time   CALCIUM 8.7 12/17/2016 0722   ALKPHOS 51 12/17/2016 0722   AST 19 12/17/2016 0722   ALT 14 12/17/2016 0722   BILITOT 0.7 12/17/2016 0722       Fasting glucose 128  RecentLabs       Lab Results  Component Value Date    HGBA1C 6.2 (H) 12/17/2016     Lab Results  Component Value Date   CHOL 153 12/17/2016   HDL 53 12/17/2016   LDLCALC 78 12/17/2016   TRIG 112 12/17/2016   CHOLHDL 2.9 12/17/2016  RecentLabs       Lab Results  Component Value Date   PSA 2.4 12/17/2016   PSA 3.82 01/06/2016   PSA 2.70 12/18/2014     RecentLabs       Lab Results  Component Value Date   WBC 5.4 12/17/2016   HGB 12.8 (L) 12/17/2016   HCT 38.6 12/17/2016   MCV 97.5 12/17/2016   PLT 206 12/17/2016     (Hgb last year was 12.3, with normal iron and B12 levels).  RecentLabs       Lab Results  Component Value Date   TSH 6.38 (H) 12/17/2016     Free T4 normal, 0.9  microalb/Cr ratio 4   ASSESSMENT/PLAN:  Essential hypertension, benign - controlled. Continue current regimen  Pure hypercholesterolemia - controlled. reviewed low cholesterol diet  Type 2 diabetes mellitus without complication, without long-term current use of insulin ([place]) - diet-controlled.  Encouraged proper diet, daily exercise  Increased prostate specific antigen (PSA) velocity - resolved.  PSA is now normal. Discussed that no further PSA screening is recommended.  prostate exam at next visit  Gait abnormality - pt reports not walking straight line, requesting handicap placard.  Discussed walker/cane to decrease fall risk and restart PT to help with gait  Abnormal TSH - normal free T4.  Continue to monitor, recheck at physical    He doesn't meet criteria for handicap placard--we reviewed what these are.  His concern regarding walking equates to a concern over fall risk from me, and so we discussed using proper assistive device when not feeling steady (he refuses).  Using an assistive device could qualify him, but he doesn't want to do that.  Strongly encouraged him to f/u with PT to work on any weak areas (hips, per pt), that could help him with his stability, gait and prevent falls.  Shingrix discussed and recommended--to get from pharmacy.  Will send labs to [facility] as FYI.  If he has concerns and wants to be seen sooner, he can f/u with him before July.   ?if fatigue is SE from med, vs heart related. Thyroid was borderline--TSH mildly elevated, normal free T4, will monitor closely.  Reviewed s/sx thyroid disease, and contact us to recheck sooner if a problem.    Schedule CPE/AWV with labs prior-- C-met, A1c, TSH, lipid    Denies needing any refills today   I encourage you to go back to physical therapy to help with your balance and strength.  Your kidneys looked a little dry on your bloodwork.  Please try and increase your water/fluid (non-caffeinated, non-alcoholic beverages) intake on a regular basis, especially during the hot weather.  Try and avoid anti-inflammatories such as advil, if possible.  Preferentially start with tylenol for pain.   If you have increasing fatigue, weight gain, hair loss, dry skin, constipation, feeling cold all the time--these could be symptoms of an underactive thyroid. Your test was borderline.  I will plan to recheck in 6 months, but will check sooner if you develop any of these symptoms.   Lipids were okay, but borderline.  If you can cut back just a little on your eggs, bacon, cheese, steaks (beef/red meat) and eat more  chicken, fish, [place], then we won't need to adjust your medications.

## 2016-12-23 ENCOUNTER — Ambulatory Visit (INDEPENDENT_AMBULATORY_CARE_PROVIDER_SITE_OTHER): Payer: Medicare Other | Admitting: Family Medicine

## 2016-12-23 ENCOUNTER — Encounter: Payer: Self-pay | Admitting: Family Medicine

## 2016-12-23 VITALS — BP 130/70 | HR 84 | Ht 69.0 in | Wt 189.8 lb

## 2016-12-23 DIAGNOSIS — I10 Essential (primary) hypertension: Secondary | ICD-10-CM | POA: Diagnosis not present

## 2016-12-23 DIAGNOSIS — R972 Elevated prostate specific antigen [PSA]: Secondary | ICD-10-CM

## 2016-12-23 DIAGNOSIS — R7989 Other specified abnormal findings of blood chemistry: Secondary | ICD-10-CM

## 2016-12-23 DIAGNOSIS — E119 Type 2 diabetes mellitus without complications: Secondary | ICD-10-CM

## 2016-12-23 DIAGNOSIS — E7800 Pure hypercholesterolemia, unspecified: Secondary | ICD-10-CM

## 2016-12-23 DIAGNOSIS — R946 Abnormal results of thyroid function studies: Secondary | ICD-10-CM

## 2016-12-23 DIAGNOSIS — R269 Unspecified abnormalities of gait and mobility: Secondary | ICD-10-CM

## 2016-12-23 NOTE — Patient Instructions (Signed)
  I encourage you to go back to physical therapy to help with your balance and strength.  Your kidneys looked a little dry on your bloodwork.  Please try and increase your water/fluid (non-caffeinated, non-alcoholic beverages) intake on a regular basis, especially during the hot weather.  Try and avoid anti-inflammatories such as advil, if possible.  Preferentially start with tylenol for pain.   If you have increasing fatigue, weight gain, hair loss, dry skin, constipation, feeling cold all the time--these could be symptoms of an underactive thyroid. Your test was borderline.  I will plan to recheck in 6 months, but will check sooner if you develop any of these symptoms.   Lipids were okay, but borderline.  If you can cut back just a little on your eggs, bacon, cheese, steaks (beef/red meat) and eat more chicken, fish, [place], then we won't need to adjust your medications.

## 2016-12-24 ENCOUNTER — Encounter: Payer: Self-pay | Admitting: Family Medicine

## 2016-12-27 NOTE — Progress Notes (Signed)
Spoke with patient and he fine waiting to see Dr. Acie Fredrickson when he is due in July. He thanked me for calling and following up.

## 2017-01-14 ENCOUNTER — Other Ambulatory Visit: Payer: Self-pay | Admitting: Family Medicine

## 2017-01-14 DIAGNOSIS — I10 Essential (primary) hypertension: Secondary | ICD-10-CM

## 2017-01-25 DIAGNOSIS — C44519 Basal cell carcinoma of skin of other part of trunk: Secondary | ICD-10-CM | POA: Diagnosis not present

## 2017-01-25 DIAGNOSIS — L821 Other seborrheic keratosis: Secondary | ICD-10-CM | POA: Diagnosis not present

## 2017-01-25 DIAGNOSIS — L720 Epidermal cyst: Secondary | ICD-10-CM | POA: Diagnosis not present

## 2017-01-25 DIAGNOSIS — Z85828 Personal history of other malignant neoplasm of skin: Secondary | ICD-10-CM | POA: Diagnosis not present

## 2017-01-25 DIAGNOSIS — L814 Other melanin hyperpigmentation: Secondary | ICD-10-CM | POA: Diagnosis not present

## 2017-02-17 DIAGNOSIS — H02132 Senile ectropion of right lower eyelid: Secondary | ICD-10-CM | POA: Diagnosis not present

## 2017-02-17 DIAGNOSIS — H02135 Senile ectropion of left lower eyelid: Secondary | ICD-10-CM | POA: Diagnosis not present

## 2017-03-09 ENCOUNTER — Encounter: Payer: Self-pay | Admitting: Neurology

## 2017-03-09 ENCOUNTER — Ambulatory Visit (INDEPENDENT_AMBULATORY_CARE_PROVIDER_SITE_OTHER): Payer: Medicare Other | Admitting: Neurology

## 2017-03-09 VITALS — BP 139/84 | HR 71 | Ht 69.0 in | Wt 185.5 lb

## 2017-03-09 DIAGNOSIS — G5139 Clonic hemifacial spasm, unspecified: Secondary | ICD-10-CM

## 2017-03-09 DIAGNOSIS — G513 Clonic hemifacial spasm: Secondary | ICD-10-CM

## 2017-03-09 MED ORDER — INCOBOTULINUMTOXINA 50 UNITS IM SOLR
50.0000 [IU] | INTRAMUSCULAR | Status: AC
  Administered 2017-03-09: 50 [IU] via INTRAMUSCULAR

## 2017-03-09 NOTE — Progress Notes (Signed)
PATIENT: Peter [name] DOB: [birth year]  Chief Complaint  Patient presents with  . Hemifacial Spasm    Xeomin 50 units x 1 vial - office supply     HISTORICAL  Peter [name] is 83 years old right-handed male, accompanied by his wife, seen in refer by Dr. Brett Fairy for evaluation of EMG guided botulism toxin injection for right hemifacial spasm  I reviewed and summarized his medical history, he had a history of hypertension hyperlipidemia, bilateral cataract surgery, around 2015, he noticed right facial muscle spasm, initially mainly involving muscle around his right eye, over the past few years, gradually getting worse, more frequent, occasionally blocking his right vision, also spreading to right cheek, right upper mouth corner, is present 25% daytime, he also has bilateral senior ptosis, occasionally blocking upper vision, was suggested bilateral blepharoplasty by his ophthalmologist.   He denies a history of right facial palsy, no visual loss, no hearing loss,  He complains of chronic cough for few months, He also complains of chronic low back pain, knee pain, mild gait abnormality.  Update April 01 2016:  We have personally reviewed MRI of brain in July 2017, there is evidence of generalized atrophy, supratentorium small vessel disease, no acute abnormalities.  We reviewed that he has vascular risk factor of aging, hypertension, hyperlipidemia, he should take baby aspirin daily.  Today is his first EMG guided Xeomin injection for right hemifacial spasm, all the questions were answered, potential side effect was explained  UPDATE Jul 07 2016: He responded very well to previous EMG guided Xeomin injection in August 2017 for right hemifacial spasm, the same time he was evaluated by ophthalmologist, is planning on to have right blepharoplasty for redundant tissue at the right upper eyelid.  UPDATE March 09 2017: He had bilateral blepharoplasty, also tear duct  procedure, only recent few weeks, he noticed recurrent right eye, and cheek muscle twitching, last Xeomin injection was on July 07 7016.   REVIEW OF SYSTEMS: Full 14 system review of systems performed and notable only for as above ALLERGIES: Allergies  Allergen Reactions  . Iodine Other (See Comments)    Feels like he is "burning up"    HOME MEDICATIONS: Current Outpatient Prescriptions  Medication Sig Dispense Refill  . aspirin EC 81 MG tablet Take 81 mg by mouth daily.    Marland Kitchen atorvastatin (LIPITOR) 10 MG tablet TAKE 1 TABLET ONCE DAILY. 90 tablet 0  . carvedilol (COREG) 25 MG tablet TAKE 1 TABLET TWICE DAILY. 180 tablet 0  . co-enzyme Q-10 30 MG capsule Take 30 mg by mouth 3 (three) times daily. Reported on 12/22/2015    . fluticasone (FLONASE) 50 MCG/ACT nasal spray Place 2 sprays into both nostrils daily. 32 g 5  . incobotulinumtoxinA (XEOMIN) 50 units SOLR injection Inject 50 Units into the muscle every 3 (three) months. Facial Spasms    . losartan (COZAAR) 25 MG tablet TAKE 1 TABLET ONCE DAILY. 90 tablet 0  . Multiple Vitamins-Minerals (MULTIVITAMIN WITH MINERALS) tablet Take 1 tablet by mouth daily. Reported on 12/22/2015    . Omega-3 Fatty Acids (FISH OIL) 1000 MG CAPS Take 1,000 mg by mouth daily. Reported on 12/22/2015     No current facility-administered medications for this visit.     PAST MEDICAL HISTORY: Past Medical History:  Diagnosis Date  . Adenomatous colon polyp 07/2013  . Asthma h/o as a child-s/p immunotherapy  . Diabetes mellitus diet controlled  . 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 FHx: colon  cancer   . Hiatal hernia   . Hypercholesteremia   . Hypertension age 59  . Internal hemorrhoids 07/2013   noted on colonoscopy  . Restlessness and agitation 08/13/2014    PAST SURGICAL HISTORY: Past Surgical History:  Procedure Laterality Date  . APPENDECTOMY    . BLEPHAROPLASTY Bilateral 12/13/2016  . CARDIOVASCULAR STRESS TEST  normal  . CATARACT EXTRACTION,  BILATERAL  07/2014, 08/2014  . COLONOSCOPY  3/08, 07/2013   no repeat rec due to age per [name]  . VASECTOMY      FAMILY HISTORY: Family History  Problem Relation Age of Onset  . Alcohol abuse Father   . Cancer Sister        stomach/?pancreatic  . Colon cancer Brother 67  . Cancer Brother        colon cancer in mid 35's  . Arthritis Daughter        psoriatic  . Hyperlipidemia Son   . Arthritis Daughter        psoriatic  . Hyperlipidemia Son   . Diabetes Maternal Grandmother   . Stroke Neg Hx   . Heart disease Neg Hx     SOCIAL HISTORY:  Social History   Social History  . Marital status: Married    Spouse name: N/A  . Number of children: 8  . Years of education: N/A   Occupational History  . 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 Smoking status: Former Smoker    Quit date: 08/09/1974  . Smokeless tobacco: Never Used  . Alcohol use 0.0 oz/week     Comment: 1-2 drinks daily, sometimes none  . Drug use: No  . Sexual activity: Yes    Partners: Female   Other Topics Concern  . Not on file   Social History Narrative   Lives with wife, dog.  Children live in [place], [place], [place], [place], and 2 daughters here in [place], [place]. 15 grandchildren, 5 great-grandchildren   Patient is right handed.  And drinks caffeine daily.     PHYSICAL EXAM   Vitals:   03/09/17 1532  BP: 139/84  Pulse: 71  Weight: 185 lb 8 oz (84.1 kg)  Height: 5\' 9"  (1.753 m)    Not recorded      Body mass index is 27.39 kg/m.  PHYSICAL EXAMNIATION:  Gen: NAD, conversant, well nourised, obese, well groomed                     Cardiovascular: Regular rate rhythm, no peripheral edema, warm, nontender. Eyes: Conjunctivae clear without exudates or hemorrhage Neck: Supple, no carotid bruise. Pulmonary: Clear to auscultation bilaterally   NEUROLOGICAL EXAM:  MENTAL STATUS: Speech:    Speech is normal; fluent and spontaneous with normal comprehension.    Cognition:     Orientation to time, place and person     Normal recent and remote memory     Normal Attention span and concentration     Normal Language, naming, repeating,spontaneous speech     Fund of knowledge   CRANIAL NERVES: CN II: Visual fields are full to confrontation. Fundoscopic exam is normal with sharp discs and no vascular changes. Pupils are round equal and briskly reactive to light. CN III, IV, VI: extraocular movement are normal. No ptosis. CN V: Facial sensation is intact to pinprick in all 3 divisions bilaterally. Corneal responses are intact.  CN VII: He has occasionally right hemifacial muscle spasm, mainly involving right orbicularis  oculi, occasional involvement of right cheek, upper mouth area CN VIII: Hearing is normal to rubbing fingers CN IX, X: Palate elevates symmetrically. Phonation is normal. CN XI: Head turning and shoulder shrug are intact CN XII: Tongue is midline with normal movements and no atrophy.  MOTOR: There is no pronator drift of out-stretched arms. Muscle bulk and tone are normal. Muscle strength is normal.  REFLEXES: Reflexes are 2+ and symmetric at the biceps, triceps, knees, and ankles. Plantar responses are flexor.  SENSORY: Intact to light touch, pinprick, positional sensation and vibratory sensation are intact in fingers and toes.  COORDINATION: Rapid alternating movements and fine finger movements are intact. There is no dysmetria on finger-to-nose and heel-knee-shin.    GAIT/STANCE: He needs push up to get up from seated position, mildly limp, unsteady   DIAGNOSTIC DATA (LABS, IMAGING, TESTING) - I reviewed patient records, labs, notes, testing and imaging myself where available.   ASSESSMENT AND PLAN  Cosby Proby Handler is a 83 y.o. male   Right hemifacial spasm  EMG guided xeomin injection, 50 units unit, used 40 units, discard 10 units  Right orbicularis oculi at 2,4, 6, 8, 9, 10 clock, 2.5 units each= 15  units Right corrugate 5 units Left corrugate 5 units Procerus 5 units  Right frontalis 2.5 Left frontalis 2.5  Left orbicularis oculi at 3, 4:00, 2.5 units each= 5 units  Cerebral Small vessel disease  He has vascular risk factor of aging, hypertension, hyperlipidemia  Have advised him start aspirin 81mg  daily   [name], M.D. Ph.D.  [facility] [address], [place], [place] [address] Ph: [phone] Fax: [phone]  CC: Rita Ohara, MD

## 2017-03-09 NOTE — Progress Notes (Signed)
**  Xeomin 50 units x 1 vial, NDC 0259-1605-01, Lot [id number], Exp 02/2018, office supply.//mck,rn**

## 2017-03-29 DIAGNOSIS — E119 Type 2 diabetes mellitus without complications: Secondary | ICD-10-CM | POA: Diagnosis not present

## 2017-03-29 DIAGNOSIS — H52223 Regular astigmatism, bilateral: Secondary | ICD-10-CM | POA: Diagnosis not present

## 2017-03-29 DIAGNOSIS — H5201 Hypermetropia, right eye: Secondary | ICD-10-CM | POA: Diagnosis not present

## 2017-03-29 DIAGNOSIS — H524 Presbyopia: Secondary | ICD-10-CM | POA: Diagnosis not present

## 2017-03-29 LAB — HM DIABETES EYE EXAM

## 2017-03-30 ENCOUNTER — Encounter: Payer: Self-pay | Admitting: Cardiovascular Disease

## 2017-03-30 ENCOUNTER — Encounter (INDEPENDENT_AMBULATORY_CARE_PROVIDER_SITE_OTHER): Payer: Self-pay

## 2017-03-30 ENCOUNTER — Ambulatory Visit (INDEPENDENT_AMBULATORY_CARE_PROVIDER_SITE_OTHER): Payer: Medicare Other | Admitting: Cardiovascular Disease

## 2017-03-30 VITALS — BP 108/50 | HR 66 | Ht 69.5 in | Wt 186.0 lb

## 2017-03-30 DIAGNOSIS — E782 Mixed hyperlipidemia: Secondary | ICD-10-CM

## 2017-03-30 DIAGNOSIS — I10 Essential (primary) hypertension: Secondary | ICD-10-CM | POA: Diagnosis not present

## 2017-03-30 NOTE — Progress Notes (Signed)
Peter [name] Date of Birth  11-May-1930 [facility] [address]     [name], [place]  [address] [phone]  Fax  [phone]   Problems 1. Htn 2. DM 3. Hyperlipidemia 4. Elevated coronary calcium 5.  Mild carotid artery disease ( 40-60 % bilaterally)   History of Present Illness:  83 yo patient of Dr. Verlon Setting.  Hx of diabetes, HTN, dyslipidemia, and elevated coronary calcium score.  Also has mild-moderate carotid disease.  Is active but he doesn't walk regularly.  Has a cabin in [place].  No chest pain or dyspnea with his usual activities.    Oct. 3, 2013 -  He has not had any cardiac problems - no chest pain.  He stays avtive but does not walk regularly.  He describes some symptoms c/w claudication but has great pulses in his feet.  His systolic BP has been a bit elevated.  November 07, 2012:  Peter [name] is doing well.  He has been going up to his cabin in [name] periodically.    He measures his BP regularly - some times his systolic readings are a bit high.  He wants to start walking regularly.    He does have some chest tightness when he walks.  He has had stress nuclear tests with Dr Verlon Setting which were negative.  He also has a hiatal hernia and esophageal spasm ( according to radiologist)  .  Aug 13 2013:  Peter [name] has done well.  He still has some mild chest discomfort with exertion - especially after he has eaten.    The pain is relieved with Tums.   He has known esophageal spasm. The pain is a  Mild tightness.  He had a myoview study 8 years ago ( Dr. Verlon Setting) which was negative.  He is not inclined to repeat the [facility] unless it is absolutely necessary.   He still gets up to his [place] house in [place].    He is able to work up there for hours without CP or dyspnea.    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  Sept. 9, 2015:  Still active and able to do all of his  normal activities. He had a lifeline screening done.  He has very mild carotid plaque.  We have known about this mild plaque. He also has some symptoms of orthostasis.   February 12, 2016:  Felling well. No CP or dyspnea. Has had a cough since April . Is trying lots of breathing meds.  Is fatigued. Has orthostasis on occasion .  Weakness  in his legs .  Aug. 22, 2018   Peter [name] is seen today .  Has had some eye surgery  No CP Still gets fatigued. ,   Walking up driveway with garbage cans causes fatigue  Overall seems to be feeling fairly well.  Current Outpatient Prescriptions on File Prior to Visit  Medication Sig Dispense Refill  . aspirin EC 81 MG tablet Take 81 mg by mouth daily.    Marland Kitchen co-enzyme Q-10 30 MG capsule Take 30 mg by mouth 3 (three) times daily as needed (supplement). Reported on 12/22/2015    . fluticasone (FLONASE) 50 MCG/ACT nasal spray Place 2 sprays into both nostrils daily. 32 g 5  . incobotulinumtoxinA (XEOMIN) 50 units SOLR injection Inject 50 Units into the muscle every 3 (three) months. Facial Spasms    .  Multiple Vitamins-Minerals (MULTIVITAMIN WITH MINERALS) tablet Take 1 tablet by mouth daily. Reported on 12/22/2015    . Omega-3 Fatty Acids (FISH OIL) 1000 MG CAPS Take 1,000 mg by mouth daily. Reported on 12/22/2015     Current Facility-Administered Medications on File Prior to Visit  Medication Dose Route Frequency Provider Last Rate Last Dose  . incobotulinumtoxinA (XEOMIN) 50 units injection 50 Units  50 Units Intramuscular Q90 days [name], MD   50 Units at 03/09/17 1628   he is not taking the norvasc currently.   Allergies  Allergen Reactions  . Iodine Other (See Comments)    Feels like he is "burning up"    Past Medical History:  Diagnosis Date  . Adenomatous colon polyp 07/2013  . Asthma h/o as a child-s/p immunotherapy  . Diabetes mellitus diet controlled  . 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 FHx: colon cancer   . Hiatal hernia   .  Hypercholesteremia   . Hypertension age 43  . Internal hemorrhoids 07/2013   noted on colonoscopy  . Restlessness and agitation 08/13/2014    Past Surgical History:  Procedure Laterality Date  . APPENDECTOMY    . BLEPHAROPLASTY Bilateral 12/13/2016  . CARDIOVASCULAR STRESS TEST  normal  . CATARACT EXTRACTION, BILATERAL  07/2014, 08/2014  . COLONOSCOPY  3/08, 07/2013   no repeat rec due to age per [name]  . VASECTOMY      History  Smoking Status  . Former Smoker  . Quit date: 08/09/1974  Smokeless Tobacco  . Never Used    History  Alcohol Use  . 0.0 oz/week    Comment: 1-2 drinks daily, sometimes none    Family History  Problem Relation Age of Onset  . Alcohol abuse Father   . Cancer Sister        stomach/?pancreatic  . Colon cancer Brother 4  . Cancer Brother        colon cancer in mid 32's  . Arthritis Daughter        psoriatic  . Hyperlipidemia Son   . Arthritis Daughter        psoriatic  . Hyperlipidemia Son   . Diabetes Maternal Grandmother   . Stroke Neg Hx   . Heart disease Neg Hx     Reviw of Systems:  Reviewed in the HPI.  All other systems are negative.  Physical Exam: BP (!) 108/50   Pulse 66   Ht 5' 9.5" (1.765 m)   Wt 186 lb (84.4 kg)   BMI 27.07 kg/m  The patient is alert and oriented x 3.  The mood and affect are normal.   Skin: warm and dry.  Color is normal.    HEENT:   the sclera are nonicteric.  The mucous membranes are moist.  The carotids are 2+ without bruits.  There is no thyromegaly.  There is no JVD.  Lungs: clear.  The chest wall is non tender.   Heart: regular rate with a normal S1 and S2.  There are no murmurs, gallops, or rubs. The PMI is not displaced.    Abdomin: good bowel sounds.  There is no guarding or rebound.  There is no hepatosplenomegaly or tenderness.  There are no masses.  Extremities:  no clubbing, cyanosis, or edema.  The legs are without rashes.  The distal pulses are intact ( 2-3+)  Neuro:  Cranial  nerves II - XII are intact.  Motor and sensory functions are intact.   The gait is normal.  ECG:  Aug. 22, 2018:  NSR at 66.   Low voltage .     Assessment / Plan:   1. Htn-   BP  Looks great   2. DM - HbA1C have been stable   3. Hyperlipidemia - on atorvastatin 10 mg a day   4. Elevated coronary calcium - no angina ,  Continue aggressive lipid management  Asymptomatic, 83 yo ,    I do not think he needs further stress testing at this point   5.  Mild carotid artery disease ( 40-60 % bilaterally) Stable     Mertie Moores, MD  03/30/2017 2:35 PM    [address],  [facility] [place], [place]  [address] Pager [phone] Phone: [phone]; Fax: [phone]

## 2017-03-30 NOTE — Patient Instructions (Signed)
Medication Instructions:  Your physician recommends that you continue on your current medications as directed. Please refer to the Current Medication list given to you today.   Labwork: None Ordered   Testing/Procedures: None Ordered   Follow-Up: Your physician wants you to follow-up in: 1 year with Dr. Nahser.  You will receive a reminder letter in the mail two months in advance. If you don't receive a letter, please call our office to schedule the follow-up appointment.   If you need a refill on your cardiac medications before your next appointment, please call your pharmacy.   Thank you for choosing [facility]! Lenon Kuennen, RN [phone]    

## 2017-04-19 ENCOUNTER — Other Ambulatory Visit: Payer: Self-pay | Admitting: Family Medicine

## 2017-04-19 LAB — HM DIABETES EYE EXAM

## 2017-04-21 ENCOUNTER — Encounter: Payer: Self-pay | Admitting: *Deleted

## 2017-05-04 ENCOUNTER — Ambulatory Visit (INDEPENDENT_AMBULATORY_CARE_PROVIDER_SITE_OTHER): Payer: Medicare Other | Admitting: Family Medicine

## 2017-05-04 ENCOUNTER — Encounter: Payer: Self-pay | Admitting: Family Medicine

## 2017-05-04 VITALS — BP 140/60 | HR 60 | Temp 97.1°F | Ht 69.5 in | Wt 183.0 lb

## 2017-05-04 DIAGNOSIS — Z23 Encounter for immunization: Secondary | ICD-10-CM

## 2017-05-04 DIAGNOSIS — J449 Chronic obstructive pulmonary disease, unspecified: Secondary | ICD-10-CM

## 2017-05-04 DIAGNOSIS — R059 Cough, unspecified: Secondary | ICD-10-CM

## 2017-05-04 DIAGNOSIS — R05 Cough: Secondary | ICD-10-CM

## 2017-05-04 NOTE — Progress Notes (Signed)
Chief Complaint  Patient presents with  . Cough    x 10 days. Drainage. Mucus is yellow to gray. Not very productive. Not much in his sinuses, no ear pain, no fevers.   . Flu Vaccine    would like to have if you are okay with it after visit.    He has frequent throat-clearing, postnasal drainage.  He feels like he has a hard time breathing through his nose (feels obstructed in the throat), but feels better when he breathes through his mouth.  He feels much better once he can expectorate up phlegm.  The phlegm is yellow-gray, sometimes even clear.  He started taking Coricidin HBP and Mucinex 12 hour twice daily. He stopped the mucinex a couple of days ago, not sure how much it helped.  It took meds for 4-5 days, then stopped.  Currently he is only using Flonase each night, and benadryl at bedtime (helps him sleep).  He coughed some last night per wife (for the first time).  Denies sick contacts.  He has more cough when he is more active, less coughing when at rest. It has not progressed yet to the chronic cough that he seems to get recurrently every year.  Belching seems to help his breathing.  He has a h/o spasmodic esophagus or hiatal hernia.  Doesn't have heartburn (better after stopping red wine).  Recalls having an episode of acid reflux in his throat one night, right around when this cough started.  H/o asthma as a child, took shots. He has never used an inhaler.  Spirometry done 04/2016: Severe airway obstruction, with low vital capacity.  He saw Dr. Halford Chessman at that time, but his cough had pretty much resolved.  It was suggested that he continue nasal irrigation and flonase.  Might need further allergy testing if cough recurred. Also suggested that he may need trial of inhaler if recurred.  PMH, PSH, SH reviewed  Botox works for 5-6 months, had another more recently.  Has had some eye surgeries in the last year  Outpatient Encounter Prescriptions as of 05/04/2017  Medication Sig Note   . aspirin EC 81 MG tablet Take 81 mg by mouth daily.   Marland Kitchen atorvastatin (LIPITOR) 10 MG tablet TAKE 1 TABLET ONCE DAILY.   . carvedilol (COREG) 25 MG tablet Take 25 mg by mouth 2 (two) times daily with a meal.   . co-enzyme Q-10 30 MG capsule Take 30 mg by mouth 3 (three) times daily as needed (supplement). Reported on 12/22/2015   . dextromethorphan-guaiFENesin (MUCINEX DM) 30-600 MG 12hr tablet Take 1 tablet by mouth 2 (two) times daily. 05/04/2017: Was taking PLAIN mucinex--hasn't used in 2-3 days  . fluticasone (FLONASE) 50 MCG/ACT nasal spray Place 2 sprays into both nostrils daily.   Marland Kitchen incobotulinumtoxinA (XEOMIN) 50 units SOLR injection Inject 50 Units into the muscle every 3 (three) months. Facial Spasms   . losartan (COZAAR) 25 MG tablet Take 25 mg by mouth daily.   . Multiple Vitamins-Minerals (MULTIVITAMIN WITH MINERALS) tablet Take 1 tablet by mouth daily. Reported on 12/22/2015   . Omega-3 Fatty Acids (FISH OIL) 1000 MG CAPS Take 1,000 mg by mouth daily. Reported on 12/22/2015   . Chlorpheniramine-DM (CORICIDIN HBP COUGH/COLD PO) Take 1 tablet by mouth every 4 (four) hours. 05/04/2017: Stopped 2-3 days ago   Facility-Administered Encounter Medications as of 05/04/2017  Medication  . incobotulinumtoxinA (XEOMIN) 50 units injection 50 Units    ROS: no fever, chills, nausea, vomiting, diarrhea, bleeding, bruising,  rash. Denies heartburn, indigestion, belching (less since stopped drinking red wine). +cough per HPI.  Moods are good.  PHYSICAL EXAM:  BP 140/60 (BP Location: Right Arm, Patient Position: Sitting, Cuff Size: Normal)   Pulse 60   Temp (!) 97.1 F (36.2 C) (Tympanic)   Ht 5' 9.5" (1.765 m)   Wt 183 lb (83 kg)   SpO2 98%   BMI 26.64 kg/m   Well appearing, pleasant male, in good spirits.  Intermittent throat-clearing, rare cough. HEENT: PERRL, EOMI,  Some right facial weakness (around the mouth) noted (unchanged, per pt, not related to Botox injections).  No twitching  noted.  Nasal mucosa is fairly normal. Some yellow crusting noted in the right nares. Sinuses are nontender. OP is clear Neck: no lymphadenopathy, thyromegaly or mass Heart; regular rate and rhythm Lungs: clear bilaterally, no wheezes, rales, ronchi. No cough with forced expiration. Abdomen: nontender. Skin: normal turgor, no rash Neuro: alert and oriented, normal gait. R facial weakness, mild, otherwise CN intact.  ASSESSMENT/PLAN:  Cough - Ddx reviewed, suspect allergies, reflux contributing.   Need for influenza vaccination - Plan: Flu vaccine HIGH DOSE PF (Fluzone High dose)  Chronic obstructive pulmonary disease, unspecified COPD type ([place]) - h/o abnormal spirometry and asthma; never used inhalers. Consider repeat spirometry and albuterol if not resolving.   Very recent onset of symptoms.  Will treat for allergies and reflux initially (Flonase, zyrtec, sinus rinses, and prilosec). Consider blood panel for allergies and trial of albuterol if cough isn't resolving.  May need spirometry repeated as well. No evidence of infection today.  F/u if symptoms persist/worsen.  This has been a chronic, recurrent problems. 25 min visit, more than 1/2 spent counseling re: Ddx, treatments, and f/u plans if not resolving. No indication for antibiotic or steroids today. Declined rx for inhaler    Drink plenty of water. Continue the Flonase. Consider using saline nasal spray throughout the day, and consider doing sinus rinses (ie Neti-pot) once or twice daily. It is possible that reflux is triggering some of your cough, although I suspect postnasal drainage (from the nose/allergies) is also contributing. Restart the Zyrtec daily. Do not take the Coricidin. Let's try and treat it multiple ways, to try and avoid the cough that won't go away that you seem to get yearly.  Start taking prilosec OTC once daily, take it either before dinner or at bedtime (take it during the day if having hearrtburn  during the day, but since you aren't, take it at night). Restart the Mucinex twice daily--this will make it easier to cough up any phlegm that is caught in the throat.  If you get more discolored phlegm or mucus, or a fever ,please contact us for an antibiotic.  If you feel the tightness moving down into your chest, any shortness of breath with exertion, or worsening cough in general, we may need to consider a trial of albuterol inhaler (which is used for reactive airways/asthma).

## 2017-05-04 NOTE — Patient Instructions (Signed)
  Drink plenty of water. Continue the Flonase. Consider using saline nasal spray throughout the day, and consider doing sinus rinses (ie Neti-pot) once or twice daily. It is possible that reflux is triggering some of your cough, although I suspect postnasal drainage (from the nose/allergies) is also contributing. Restart the Zyrtec daily. Do not take the Coricidin. Let's try and treat it multiple ways, to try and avoid the cough that won't go away that you seem to get yearly.  Start taking prilosec OTC once daily, take it either before dinner or at bedtime (take it during the day if having hearrtburn during the day, but since you aren't, take it at night). Restart the Mucinex twice daily--this will make it easier to cough up any phlegm that is caught in the throat.  If you get more discolored phlegm or mucus, or a fever ,please contact us for an antibiotic.  If you feel the tightness moving down into your chest, any shortness of breath with exertion, or worsening cough in general, we may need to consider a trial of albuterol inhaler (which is used for reactive airways/asthma).

## 2017-05-09 ENCOUNTER — Telehealth: Payer: Self-pay | Admitting: Family Medicine

## 2017-05-09 ENCOUNTER — Ambulatory Visit: Payer: Medicare Other | Admitting: Cardiovascular Disease

## 2017-05-09 MED ORDER — ALBUTEROL SULFATE HFA 108 (90 BASE) MCG/ACT IN AERS: 2.0000 | INHALATION_SPRAY | Freq: Four times a day (QID) | RESPIRATORY_TRACT | 1 refills | Status: DC | PRN

## 2017-05-09 NOTE — Telephone Encounter (Signed)
rx sent to pharmacy

## 2017-05-09 NOTE — Telephone Encounter (Signed)
Pt called and states that he needs a inhaler states he thinks that he should have one for back up just in case that he needs one, states that every now and then he does have breathing issues, states you and him discussed this at his recent office visit pt can be reached at [phone] he uses [place], [place]

## 2017-05-10 NOTE — Telephone Encounter (Signed)
Called and left message that rx was sent to the pharmacy

## 2017-05-18 ENCOUNTER — Telehealth: Payer: Self-pay | Admitting: *Deleted

## 2017-05-18 NOTE — Telephone Encounter (Signed)
Patient scheduled for tomorrow

## 2017-05-18 NOTE — Telephone Encounter (Signed)
Spoke with patient and he was calling with an update from his 05/04/17 appt with you. Says he feels no better. Feels exactly the same. Thinks he may need that steroid, he states. Not getting any better. Having some discomfort at times when breathing, cough is still the same-not very productive, just bits of stuff, no fevers. Has some stuff in his sinuses he said. Still has a zpak on hand from a year ago from another doctor, Halford Chessman I think. He is leaving next Wed to go out town x 2 weeks and wants to knock this out. States that last time he was given a prednisone pak, again I believe he said by another doctor (Sood?) and this was what helped. Please advise.

## 2017-05-18 NOTE — Telephone Encounter (Signed)
Per my note last visit, if he wasn't improving I was going to recheck spirometry, and consider bloodwork testing for allergies. If he is able to do this, schedule OV.  If he isn't able to do this, okay for medrol dosepak, but this will need to be done in the future if he has this problem again (not at the initial visit with only a few days of symptoms, but when symptoms last over 1-2 weeks--he usually just calls, rather than returning).

## 2017-05-19 ENCOUNTER — Encounter: Payer: Self-pay | Admitting: Family Medicine

## 2017-05-19 ENCOUNTER — Ambulatory Visit (INDEPENDENT_AMBULATORY_CARE_PROVIDER_SITE_OTHER): Payer: Medicare Other | Admitting: Family Medicine

## 2017-05-19 VITALS — BP 170/70 | HR 64 | Temp 98.5°F | Ht 69.5 in | Wt 184.2 lb

## 2017-05-19 DIAGNOSIS — R05 Cough: Secondary | ICD-10-CM

## 2017-05-19 DIAGNOSIS — J441 Chronic obstructive pulmonary disease with (acute) exacerbation: Secondary | ICD-10-CM | POA: Diagnosis not present

## 2017-05-19 DIAGNOSIS — R059 Cough, unspecified: Secondary | ICD-10-CM

## 2017-05-19 DIAGNOSIS — R0602 Shortness of breath: Secondary | ICD-10-CM | POA: Diagnosis not present

## 2017-05-19 MED ORDER — METHYLPREDNISOLONE 4 MG PO TBPK: ORAL_TABLET | ORAL | 0 refills | Status: DC

## 2017-05-19 MED ORDER — IPRATROPIUM-ALBUTEROL 0.5-2.5 (3) MG/3ML IN SOLN
3.0000 mL | Freq: Once | RESPIRATORY_TRACT | Status: AC
  Administered 2017-05-19: 3 mL via RESPIRATORY_TRACT

## 2017-05-19 MED ORDER — UMECLIDINIUM-VILANTEROL 62.5-25 MCG/INH IN AEPB: 1.0000 | INHALATION_SPRAY | Freq: Every day | RESPIRATORY_TRACT | 2 refills | Status: DC

## 2017-05-19 NOTE — Patient Instructions (Signed)
Take the steroid course as directed Use the Anoro sample once daily; the sample lasts a week, and there is a prescription also at your pharmacy. Use this every day once daily. We will recheck your lungs at your visit next month.  You may also use the albuterol if needed for chest tightness/cough, even along with the other inhaler, if needed.   Let us know if you have fever, discolored mucus, worsening shortness of breath--don't wait a month.

## 2017-05-19 NOTE — Progress Notes (Signed)
Chief Complaint  Patient presents with  . Follow-up    not better, about the same as he was on 05/04/17. Still coughing. Doing spirometry today. Feels like it is activity induced.    Patient presents to follow up on cough. He has not used any albuterol.  He called to get the prescription, picked it up, but never used it, "I didn't feel like I needed it", only thought it was for "emergencies".  Cough is worse later in the day. No fever or chills.  Denies discolored mucus or phlegm.  Cough is mostly nonproductive, but sometimes he will get something up, and then he feels much better. Stopped using mucinex a week ago, didn't find it helpful. Using zyrtec, Flonase, nasal saline.  He has been using Nyquil at night, which helps him get a good night sleep.   BP's at home have been 120-130/50's.  PMH, PSH, SH reviewed  Outpatient Encounter Prescriptions as of 05/19/2017  Medication Sig Note  . aspirin EC 81 MG tablet Take 81 mg by mouth daily.   Marland Kitchen atorvastatin (LIPITOR) 10 MG tablet TAKE 1 TABLET ONCE DAILY.   . carvedilol (COREG) 25 MG tablet Take 25 mg by mouth 2 (two) times daily with a meal.   . cetirizine (ZYRTEC) 10 MG tablet Take 10 mg by mouth daily.   Marland Kitchen co-enzyme Q-10 30 MG capsule Take 30 mg by mouth 3 (three) times daily as needed (supplement). Reported on 12/22/2015   . fluticasone (FLONASE) 50 MCG/ACT nasal spray Place 2 sprays into both nostrils daily.   Marland Kitchen incobotulinumtoxinA (XEOMIN) 50 units SOLR injection Inject 50 Units into the muscle every 3 (three) months. Facial Spasms   . losartan (COZAAR) 25 MG tablet Take 25 mg by mouth daily.   . Multiple Vitamins-Minerals (MULTIVITAMIN WITH MINERALS) tablet Take 1 tablet by mouth daily. Reported on 12/22/2015   . Omega-3 Fatty Acids (FISH OIL) 1000 MG CAPS Take 1,000 mg by mouth daily. Reported on 12/22/2015   . sodium chloride (OCEAN) 0.65 % SOLN nasal spray Place 1 spray into both nostrils as needed for congestion.   Marland Kitchen albuterol  (PROVENTIL HFA;VENTOLIN HFA) 108 (90 Base) MCG/ACT inhaler Inhale 2 puffs into the lungs every 6 (six) hours as needed for wheezing or shortness of breath. (Patient not taking: Reported on 05/19/2017)   . Chlorpheniramine-DM (CORICIDIN HBP COUGH/COLD PO) Take 1 tablet by mouth every 4 (four) hours. 05/04/2017: Stopped 2-3 days ago  . dextromethorphan-guaiFENesin (MUCINEX DM) 30-600 MG 12hr tablet Take 1 tablet by mouth 2 (two) times daily. 05/04/2017: Was taking PLAIN mucinex--hasn't used in 2-3 days  . methylPREDNISolone (MEDROL DOSEPAK) 4 MG TBPK tablet Take as directed   . umeclidinium-vilanterol (ANORO ELLIPTA) 62.5-25 MCG/INH AEPB Inhale 1 puff into the lungs daily.    Facility-Administered Encounter Medications as of 05/19/2017  Medication  . incobotulinumtoxinA (XEOMIN) 50 units injection 50 Units  . [COMPLETED] ipratropium-albuterol (DUONEB) 0.5-2.5 (3) MG/3ML nebulizer solution 3 mL   (medrol pak and Anoro started today, not prior to visit).  Allergies  Allergen Reactions  . Iodine Other (See Comments)    Feels like he is "burning up"   ROS: no fever, chills, headaches, dizziness, chest pain. +cough, shortness of breath. No GI or GU complaints, bleeding, bruising, rash, mood changes or other concerns.  See HPI   PHYSICAL EXAM:  BP (!) 170/70 (BP Location: Right Arm, Patient Position: Sitting, Cuff Size: Normal)   Pulse 64   Temp 98.5 F (36.9 C) (Tympanic)  Ht 5' 9.5" (1.765 m)   Wt 184 lb 3.2 oz (83.6 kg)   SpO2 97%   BMI 26.81 kg/m   Pleasant elderly male in no distress.  Speaking easily, in full sentences, but with frequent dry cough during visit HEENT: PERRL, EOMI, conjunctiva and sclera are clear.  Nasal mucosa is normal, OP is clear. Sinuses are nontender Neck: no lymphadenopathy or mass Heart: regular rate and rhythm Lung:  Diffuse wheezing noted.  No rales or ronchi.   Spirometry: severe airway obstruction He was treated with DuoNeb, after which his lungs were  completely clear.  Improved air movement, no wheezes, rales, ronchi Skin: normal color, turgor Psych: normal mood, affect, hygiene and grooming. He is in good spirits today   ASSESSMENT/PLAN:   Chronic obstructive pulmonary disease with acute exacerbation (HCC) - Counseled re: risks/side effects of steroids and anoro; how to properly use inhaler - Plan: umeclidinium-vilanterol (ANORO ELLIPTA) 62.5-25 MCG/INH AEPB, methylPREDNISolone (MEDROL DOSEPAK) 4 MG TBPK tablet, ipratropium-albuterol (DUONEB) 0.5-2.5 (3) MG/3ML nebulizer solution 3 mL  Cough - Plan: Spirometry with Graph, ipratropium-albuterol (DUONEB) 0.5-2.5 (3) MG/3ML nebulizer solution 3 mL  SOB (shortness of breath) - Plan: Spirometry with Graph, ipratropium-albuterol (DUONEB) 0.5-2.5 (3) MG/3ML nebulizer solution 3 mL   Anoro sample given and rx sent to his pharmacy Medrol dosepak rx'd. No need for ABX (he still has z-pak at home); advised to let us know if fever or discolored sputum.  F/u in a month.  Will repeat spirometry at that time, when using Anoro

## 2017-05-21 ENCOUNTER — Encounter: Payer: Self-pay | Admitting: Family Medicine

## 2017-05-25 ENCOUNTER — Other Ambulatory Visit: Payer: Self-pay | Admitting: Medical

## 2017-06-02 DIAGNOSIS — R490 Dysphonia: Secondary | ICD-10-CM | POA: Diagnosis not present

## 2017-06-02 DIAGNOSIS — R1313 Dysphagia, pharyngeal phase: Secondary | ICD-10-CM | POA: Diagnosis not present

## 2017-06-15 ENCOUNTER — Encounter: Payer: Self-pay | Admitting: Neurology

## 2017-06-15 ENCOUNTER — Ambulatory Visit: Payer: Medicare Other | Admitting: Neurology

## 2017-06-15 VITALS — BP 129/63 | HR 66 | Ht 69.5 in | Wt 191.0 lb

## 2017-06-15 DIAGNOSIS — G5131 Clonic hemifacial spasm, right: Secondary | ICD-10-CM

## 2017-06-15 DIAGNOSIS — I69351 Hemiplegia and hemiparesis following cerebral infarction affecting right dominant side: Secondary | ICD-10-CM

## 2017-06-15 NOTE — Progress Notes (Signed)
PATIENT: Peter [name] DOB: [birth year]  Chief Complaint  Patient presents with  . Follow-up    Xeomin Injection. EVE [name], MD.      HISTORICAL  Peter [name] is 83 years old right-handed male, accompanied by his wife, seen in refer by Dr. Brett Fairy for evaluation of EMG guided botulism toxin injection for right hemifacial spasm  I reviewed and summarized his medical history, he had a history of hypertension hyperlipidemia, bilateral cataract surgery, around 2015, he noticed right facial muscle spasm, initially mainly involving muscle around his right eye, over the past few years, gradually getting worse, more frequent, occasionally blocking his right vision, also spreading to right cheek, right upper mouth corner, is present 25% daytime, he also has bilateral senior ptosis, occasionally blocking upper vision, was suggested bilateral blepharoplasty by his ophthalmologist.   He denies a history of right facial palsy, no visual loss, no hearing loss,  He complains of chronic cough for few months, He also complains of chronic low back pain, knee pain, mild gait abnormality.  Update April 01 2016:  We have personally reviewed MRI of brain in July 2017, there is evidence of generalized atrophy, supratentorium small vessel disease, no acute abnormalities.  We reviewed that he has vascular risk factor of aging, hypertension, hyperlipidemia, he should take baby aspirin daily.  Today is his first EMG guided Xeomin injection for right hemifacial spasm, all the questions were answered, potential side effect was explained  UPDATE Jul 07 2016: He responded very well to previous EMG guided Xeomin injection in August 2017 for right hemifacial spasm, the same time he was evaluated by ophthalmologist, is planning on to have right blepharoplasty for redundant tissue at the right upper eyelid.  UPDATE March 09 2017: He had bilateral blepharoplasty, also tear duct procedure, only recent  few weeks, he noticed recurrent right eye, and cheek muscle twitching, last Xeomin injection was on July 07 7016.  UPDATE Jun 15 2017: He did very well with previous injection, there is no significant side effect noticed.  REVIEW OF SYSTEMS: Full 14 system review of systems performed and notable only for as above ALLERGIES: Allergies  Allergen Reactions  . Iodine Other (See Comments)    Feels like he is "burning up"    HOME MEDICATIONS: Current Outpatient Medications  Medication Sig Dispense Refill  . aspirin EC 81 MG tablet Take 81 mg by mouth daily.    Marland Kitchen atorvastatin (LIPITOR) 10 MG tablet TAKE 1 TABLET ONCE DAILY. 90 tablet 0  . carvedilol (COREG) 25 MG tablet TAKE 1 TABLET TWICE DAILY. 180 tablet 0  . cetirizine (ZYRTEC) 10 MG tablet Take 10 mg by mouth daily.    . Chlorpheniramine-DM (CORICIDIN HBP COUGH/COLD PO) Take 1 tablet by mouth every 4 (four) hours.    Marland Kitchen dextromethorphan-guaiFENesin (MUCINEX DM) 30-600 MG 12hr tablet Take 1 tablet by mouth 2 (two) times daily.    . fluticasone (FLONASE) 50 MCG/ACT nasal spray Place 2 sprays into both nostrils daily. 32 g 5  . incobotulinumtoxinA (XEOMIN) 50 units SOLR injection Inject 50 Units into the muscle every 3 (three) months. Facial Spasms    . losartan (COZAAR) 25 MG tablet TAKE 1 TABLET ONCE DAILY. 90 tablet 0  . methylPREDNISolone (MEDROL DOSEPAK) 4 MG TBPK tablet Take as directed 21 tablet 0  . Multiple Vitamins-Minerals (MULTIVITAMIN WITH MINERALS) tablet Take 1 tablet by mouth daily. Reported on 12/22/2015    . Omega-3 Fatty Acids (FISH OIL) 1000 MG CAPS Take 1,000  mg by mouth daily. Reported on 12/22/2015    . sodium chloride (OCEAN) 0.65 % SOLN nasal spray Place 1 spray into both nostrils as needed for congestion.    Marland Kitchen umeclidinium-vilanterol (ANORO ELLIPTA) 62.5-25 MCG/INH AEPB Inhale 1 puff into the lungs daily. 60 each 2   Current Facility-Administered Medications  Medication Dose Route Frequency Provider Last Rate Last  Dose  . incobotulinumtoxinA (XEOMIN) 50 units injection 50 Units  50 Units Intramuscular Q90 days [name], MD   50 Units at 03/09/17 1628    PAST MEDICAL HISTORY: Past Medical History:  Diagnosis Date  . Adenomatous colon polyp 07/2013  . Asthma h/o as a child-s/p immunotherapy  . Diabetes mellitus diet controlled  . 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 FHx: colon cancer   . Hiatal hernia   . Hypercholesteremia   . Hypertension age 32  . Internal hemorrhoids 07/2013   noted on colonoscopy  . Restlessness and agitation 08/13/2014    PAST SURGICAL HISTORY: Past Surgical History:  Procedure Laterality Date  . APPENDECTOMY    . BLEPHAROPLASTY Bilateral 12/13/2016  . CARDIOVASCULAR STRESS TEST  normal  . CATARACT EXTRACTION, BILATERAL  07/2014, 08/2014  . COLONOSCOPY  3/08, 07/2013   no repeat rec due to age per [name]  . VASECTOMY      FAMILY HISTORY: Family History  Problem Relation Age of Onset  . Alcohol abuse Father   . Cancer Sister        stomach/?pancreatic  . Colon cancer Brother 85  . Cancer Brother        colon cancer in mid 41's  . Arthritis Daughter        psoriatic  . Hyperlipidemia Son   . Arthritis Daughter        psoriatic  . Hyperlipidemia Son   . Diabetes Maternal Grandmother   . Stroke Neg Hx   . Heart disease Neg Hx     SOCIAL HISTORY:  Social History   Socioeconomic History  . Marital status: Married    Spouse name: Not on file  . Number of children: 8  . Years of education: Not on file  . Highest education level: Not on file  Social Needs  . Financial resource strain: Not on file  . Food insecurity - worry: Not on file  . Food insecurity - inability: Not on file  . Transportation needs - medical: Not on file  . Transportation needs - non-medical: Not on file  Occupational History  . 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 Smoking status: Former Smoker    Last attempt to quit: 08/09/1974    Years since quitting: 42.8    . Smokeless tobacco: Never Used  Substance and Sexual Activity  . Alcohol use: Yes    Alcohol/week: 0.0 oz    Comment: 1-2 drinks daily, sometimes none  . Drug use: No  . Sexual activity: Yes    Partners: Female  Other Topics Concern  . Not on file  Social History Narrative   Lives with wife, dog.  Children live in [place], [place], [place], [place], and 2 daughters here in [place], [place]. 15 grandchildren, 5 great-grandchildren   Patient is right handed.  And drinks caffeine daily.     PHYSICAL EXAM   Vitals:   06/15/17 0824  BP: 129/63  Pulse: 66  Weight: 191 lb (86.6 kg)  Height: 5' 9.5" (1.765 m)    Not recorded      Body mass index  is 27.8 kg/m.  PHYSICAL EXAMNIATION:  Gen: NAD, conversant, well nourised, obese, well groomed                     Cardiovascular: Regular rate rhythm, no peripheral edema, warm, nontender. Eyes: Conjunctivae clear without exudates or hemorrhage Neck: Supple, no carotid bruise. Pulmonary: Clear to auscultation bilaterally   NEUROLOGICAL EXAM:  MENTAL STATUS: Speech:    Speech is normal; fluent and spontaneous with normal comprehension.  Cognition:     Orientation to time, place and person     Normal recent and remote memory     Normal Attention span and concentration     Normal Language, naming, repeating,spontaneous speech     Fund of knowledge   CRANIAL NERVES: CN II: Visual fields are full to confrontation. Fundoscopic exam is normal with sharp discs and no vascular changes. Pupils are round equal and briskly reactive to light. CN III, IV, VI: extraocular movement are normal. No ptosis. CN V: Facial sensation is intact to pinprick in all 3 divisions bilaterally. Corneal responses are intact.  CN VII: He rarely has right hemifacial muscle spasm, mainly involving right orbicularis oculi, occasional involvement of right cheek, upper mouth area CN VIII: Hearing is normal to rubbing fingers CN IX, X: Palate elevates  symmetrically. Phonation is normal. CN XI: Head turning and shoulder shrug are intact CN XII: Tongue is midline with normal movements and no atrophy.  MOTOR: There is no pronator drift of out-stretched arms. Muscle bulk and tone are normal. Muscle strength is normal.  REFLEXES: Reflexes are 2+ and symmetric at the biceps, triceps, knees, and ankles. Plantar responses are flexor.  SENSORY: Intact to light touch, pinprick, positional sensation and vibratory sensation are intact in fingers and toes.  COORDINATION: Rapid alternating movements and fine finger movements are intact. There is no dysmetria on finger-to-nose and heel-knee-shin.    GAIT/STANCE: He needs push up to get up from seated position, mildly limp, unsteady   DIAGNOSTIC DATA (LABS, IMAGING, TESTING) - I reviewed patient records, labs, notes, testing and imaging myself where available.   ASSESSMENT AND PLAN  Peter [name] is a 83 y.o. male   Right hemifacial spasm  EMG guided xeomin injection, 50 units unit, used 27.5 units, discard 22.5 units  Right orbicularis oculi at 2,4, 6, 8, 9, 10 clock, 2.5 units each= 15 units Right corrugate 2.5 units Left corrugate 2.5 units Procerus 2.5 units  Left orbicularis oculi at 3, 4:00, 2.5 units each= 5 units  Cerebral Small vessel disease  He has vascular risk factor of aging, hypertension, hyperlipidemia  Have advised him start aspirin 81mg  daily   [name], M.D. Ph.D.  [facility] [address] [place], [place] [address] Ph: [phone] Fax: [phone]  CC: Rita Ohara, MD

## 2017-06-15 NOTE — Progress Notes (Signed)
Xeomin 50units x 1 vial. Lot: [id number] Exp: 11/2018 NDC: [id number]. (mixed with 1cc saline)

## 2017-06-16 ENCOUNTER — Other Ambulatory Visit: Payer: Self-pay | Admitting: Family Medicine

## 2017-06-16 DIAGNOSIS — J309 Allergic rhinitis, unspecified: Secondary | ICD-10-CM

## 2017-06-21 ENCOUNTER — Telehealth: Payer: Self-pay | Admitting: Family Medicine

## 2017-06-21 NOTE — Telephone Encounter (Signed)
Pt left message wants Rx for Sildenafil 100 mg #30 to [facility] # [id number], said had Rx year ago

## 2017-06-21 NOTE — Telephone Encounter (Signed)
Ok for rx?

## 2017-06-22 MED ORDER — SILDENAFIL CITRATE 100 MG PO TABS: 100.0000 mg | ORAL_TABLET | Freq: Every day | ORAL | 0 refills | Status: DC | PRN

## 2017-06-22 NOTE — Telephone Encounter (Signed)
Done

## 2017-06-29 ENCOUNTER — Encounter: Payer: Medicare Other | Admitting: Family Medicine

## 2017-07-11 ENCOUNTER — Other Ambulatory Visit: Payer: Self-pay | Admitting: Family Medicine

## 2017-07-12 ENCOUNTER — Other Ambulatory Visit: Payer: Medicare Other

## 2017-07-14 ENCOUNTER — Ambulatory Visit: Payer: Medicare Other | Admitting: Family Medicine

## 2017-07-27 DIAGNOSIS — L814 Other melanin hyperpigmentation: Secondary | ICD-10-CM | POA: Diagnosis not present

## 2017-07-27 DIAGNOSIS — D044 Carcinoma in situ of skin of scalp and neck: Secondary | ICD-10-CM | POA: Diagnosis not present

## 2017-07-27 DIAGNOSIS — L821 Other seborrheic keratosis: Secondary | ICD-10-CM | POA: Diagnosis not present

## 2017-07-27 DIAGNOSIS — D1801 Hemangioma of skin and subcutaneous tissue: Secondary | ICD-10-CM | POA: Diagnosis not present

## 2017-07-27 DIAGNOSIS — Z85828 Personal history of other malignant neoplasm of skin: Secondary | ICD-10-CM | POA: Diagnosis not present

## 2017-08-04 ENCOUNTER — Other Ambulatory Visit: Payer: Self-pay | Admitting: Family Medicine

## 2017-08-11 ENCOUNTER — Other Ambulatory Visit: Payer: Self-pay | Admitting: Family Medicine

## 2017-08-23 DIAGNOSIS — J310 Chronic rhinitis: Secondary | ICD-10-CM | POA: Diagnosis not present

## 2017-09-25 ENCOUNTER — Other Ambulatory Visit: Payer: Self-pay | Admitting: Family Medicine

## 2017-09-26 NOTE — Telephone Encounter (Signed)
Is this okay to refill? 

## 2017-11-09 ENCOUNTER — Other Ambulatory Visit: Payer: Self-pay | Admitting: Family Medicine

## 2017-12-08 ENCOUNTER — Other Ambulatory Visit: Payer: Self-pay | Admitting: Family Medicine

## 2017-12-12 DIAGNOSIS — J310 Chronic rhinitis: Secondary | ICD-10-CM | POA: Diagnosis not present

## 2017-12-29 ENCOUNTER — Other Ambulatory Visit: Payer: Self-pay | Admitting: Family Medicine

## 2018-01-24 ENCOUNTER — Ambulatory Visit: Payer: Medicare Other | Admitting: Sports Medicine

## 2018-01-24 ENCOUNTER — Encounter: Payer: Self-pay | Admitting: Sports Medicine

## 2018-01-24 VITALS — BP 148/70 | Ht 69.0 in | Wt 191.0 lb

## 2018-01-24 DIAGNOSIS — M25552 Pain in left hip: Secondary | ICD-10-CM | POA: Diagnosis not present

## 2018-01-24 DIAGNOSIS — M25559 Pain in unspecified hip: Secondary | ICD-10-CM | POA: Diagnosis not present

## 2018-01-24 DIAGNOSIS — M2170 Unequal limb length (acquired), unspecified site: Secondary | ICD-10-CM | POA: Diagnosis not present

## 2018-01-24 DIAGNOSIS — R269 Unspecified abnormalities of gait and mobility: Secondary | ICD-10-CM | POA: Diagnosis not present

## 2018-01-24 NOTE — Assessment & Plan Note (Signed)
This may be partially trggered by weakness   Suspect element of true LLI  Will try lift on left  Trendelenburg drops left shoulder on gait assessment

## 2018-01-24 NOTE — Patient Instructions (Signed)
[facility] [address], [place]  Call to schedule your first appointment.  Follow up with [place] in 6 weeks in you're not doing any better.

## 2018-01-24 NOTE — Progress Notes (Signed)
Chief complaint: Left lateral hip and leg pain x2 months  History of present illness: Peter [name] is an 84 year old male who presents to sports medicine office today with chief complaint of left lateral hip and leg pain.  He reports that symptoms have been present for approximately 2 months now.  He does not report of any specific inciting incident, trauma, or injury to explain the pain.  He points to the outside of his left hip near the greater trochanter as to where he feels pain.  He reports that he is not able to lay on the left side at nighttime secondary to pain.  He reports that he is comfortable sleeping on his stomach.  He reports that he has noticed when he is walking that he gets fatigued easily back in the hips and glutes.  He does not report of any anterior hip pain or groin pain.  He does not report of any knee pain.  He does report feeling pain going on on the outside of his left leg down to the knee, but not specifically involving the knee joint itself.  He does not report of any painful popping, locking, catching, or symptoms of giving way in his left knee.  He does not report of any swelling, warmth, or redness in his left knee.  He reports that about a year and a half ago he did undergo double cataract surgery with new lens placement.  He reports since that time he has been having balance incoordination.  He has been doing a few sessions of formal physical therapy for balance training.  Fortunately, he does not report of having significant pain in his left hip and leg where he requires to use any medicines for pain.  He does not report of any numbness, tingling, or burning paresthesias.  He does not report of any low back pain.  He does not report of any bowel or bladder incontinence, does not report of any saddle anesthesia.  He does not report of any fevers, chills, night sweats, or any unintentional weight loss.  He does not report of any symptoms on his right side.  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  Review of systems:  As stated above  His past medical history, surgical history, family history, and social history obtained and reviewed.  His past medical history is notable for hypertension, type 2 diabetes, hyperlipidemia; surgical history notable for vasectomy, cataract extraction bilaterally, appendectomy, and blepharoplasty; he does not report of any current tobacco use; family history notable for colon cancer, pancreatic cancer, psoriatic arthritis, hyperlipidemia, alcohol abuse, and type 2 diabetes; allergies and medications have been reviewed and are reflected in EMR.  Physical exam: Vital signs are reviewed and are documented in the chart Gen.: Alert, oriented, appears stated age, in no apparent distress HEENT: Moist oral mucosa Respiratory: Normal respirations, able to speak in full sentences Cardiac: Regular rate, distal pulses 2+ Integumentary: No rashes on visible skin:  Neurologic: He does have slight weakness noted with hip flexion, hip external range of motion, and with hip abduction on the left side, would categorize strength as 4+/5, he does have intact strength with left knee flexion, left knee extension, left quadriceps, and left hamstring strength, sensation 2+ in bilateral lower extremities Psych: Normal affect, mood is described as good Musculoskeletal: Inspection of  his left hip low back reveals no obvious deformity or muscle atrophy, no warmth, erythema, ecchymosis, or effusion, he is tender to palpation over the greater trochanter on the left side, he is also tender to palpation over the distal aspect of the gluten medius, glutes minimus, and piriformis as it attaches over the greater trochanter, no tenderness over the lumbar spine, paraspinal lumbar processes, or SI joints on both sides, he does have  great hip flexion, hip internal, and hip external range of motion, he does have intact range of motion with his left knee going from 0 degrees to 140 degrees of range of motion, no tenderness to palpation over the medial and lateral joint line of the left knee, no tenderness palpation over the left anterior hip, straight leg, FABER, FADIR are all negative, on gait evaluation he is noticed to have a Trendelenburg gait on the left side, where he is also dropping his left shoulder, taking a look at his leg length, he does have a leg length discrepancy with his left leg being about a centimeter shorter than his right leg  Assessment and plan: 1.  Left greater trochanteric pain syndrome 2.  Leg length discrepancy, with left leg being about a centimeter shorter than the right leg  Plan: Discussed with Peter [name] today that his symptoms seem to be consistent with left greater trochanteric pain syndrome.  It is more of a nagging pain rather than a true pain, definitely not enough to where he wants to have a cortisone injection into the greater trochanteric bursa today.  He is noticed to have weakness with hip abduction, hip flexion, hip external range of motion on the left side.  Do feel that based on his weakness and balance and coordination after his cataract surgery that he would benefit from formal physical therapy to work on this.  He is agreeable to this. Suspect his avid golfing several years ago contributed and set him up for this issue. With his Trendelenburg gait and leg length discrepancy, did have him fitted for a heel pad/left in his left shoe.  This did help correct some of the Trendelenburg gait.  He will follow-up here in 6 weeks for reevaluation or sooner as needed.   Peter [name], M.D. Primary Care Sports Medicine Fellow  I observed and examined the patient with the [facility] fellow and agree with assessment and plan.  Note reviewed and modified by me. Peter Libel, MD Peter [name]

## 2018-01-24 NOTE — Assessment & Plan Note (Signed)
This seems related to weakness and to trendelenburg gait LL inequality likely contributes  Plan formal PT Use extra felt lift in left shoe

## 2018-01-25 DIAGNOSIS — C4442 Squamous cell carcinoma of skin of scalp and neck: Secondary | ICD-10-CM | POA: Diagnosis not present

## 2018-01-25 DIAGNOSIS — L821 Other seborrheic keratosis: Secondary | ICD-10-CM | POA: Diagnosis not present

## 2018-01-25 DIAGNOSIS — L570 Actinic keratosis: Secondary | ICD-10-CM | POA: Diagnosis not present

## 2018-01-25 DIAGNOSIS — L814 Other melanin hyperpigmentation: Secondary | ICD-10-CM | POA: Diagnosis not present

## 2018-01-25 DIAGNOSIS — Z85828 Personal history of other malignant neoplasm of skin: Secondary | ICD-10-CM | POA: Diagnosis not present

## 2018-02-06 ENCOUNTER — Telehealth: Payer: Self-pay | Admitting: Internal Medicine

## 2018-02-06 NOTE — Telephone Encounter (Signed)
Now I can see labs and agency has been switched to be able to release the orders

## 2018-02-06 NOTE — Telephone Encounter (Signed)
I extended the orders, but you may need to switch out the lab

## 2018-02-06 NOTE — Telephone Encounter (Signed)
Pt has an appt on Friday for fasting labs. Future orders that have been put in last year have expired as its been over a year already

## 2018-02-06 NOTE — Addendum Note (Signed)
Addended by: Minette Headland A on: 02/06/2018 04:49 PM   Modules accepted: Orders

## 2018-02-10 ENCOUNTER — Other Ambulatory Visit: Payer: Medicare Other

## 2018-02-10 DIAGNOSIS — H6121 Impacted cerumen, right ear: Secondary | ICD-10-CM | POA: Diagnosis not present
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
## 2018-02-13 ENCOUNTER — Ambulatory Visit: Payer: Medicare Other | Admitting: Family Medicine

## 2018-02-13 ENCOUNTER — Encounter: Payer: Self-pay | Admitting: Family Medicine

## 2018-02-13 VITALS — BP 130/58 | HR 72 | Ht 67.0 in | Wt 189.8 lb

## 2018-02-13 DIAGNOSIS — J449 Chronic obstructive pulmonary disease, unspecified: Secondary | ICD-10-CM

## 2018-02-13 DIAGNOSIS — I739 Peripheral vascular disease, unspecified: Secondary | ICD-10-CM

## 2018-02-13 DIAGNOSIS — I779 Disorder of arteries and arterioles, unspecified: Secondary | ICD-10-CM

## 2018-02-13 DIAGNOSIS — E119 Type 2 diabetes mellitus without complications: Secondary | ICD-10-CM

## 2018-02-13 DIAGNOSIS — Z1211 Encounter for screening for malignant neoplasm of colon: Secondary | ICD-10-CM | POA: Diagnosis not present

## 2018-02-13 DIAGNOSIS — E7800 Pure hypercholesterolemia, unspecified: Secondary | ICD-10-CM | POA: Diagnosis not present

## 2018-02-13 DIAGNOSIS — J309 Allergic rhinitis, unspecified: Secondary | ICD-10-CM

## 2018-02-13 DIAGNOSIS — G5139 Clonic hemifacial spasm, unspecified: Secondary | ICD-10-CM | POA: Diagnosis not present

## 2018-02-13 DIAGNOSIS — I10 Essential (primary) hypertension: Secondary | ICD-10-CM

## 2018-02-13 DIAGNOSIS — R931 Abnormal findings on diagnostic imaging of heart and coronary circulation: Secondary | ICD-10-CM

## 2018-02-13 DIAGNOSIS — Z0000 Encounter for general adult medical examination without abnormal findings: Secondary | ICD-10-CM | POA: Diagnosis not present

## 2018-02-13 LAB — POCT GLYCOSYLATED HEMOGLOBIN (HGB A1C): Hemoglobin A1C: 6.5 % — AB (ref 4.0–5.6)

## 2018-02-13 MED ORDER — FLUTICASONE PROPIONATE 50 MCG/ACT NA SUSP: 2.0000 | Freq: Every day | NASAL | 5 refills | Status: DC

## 2018-02-13 MED ORDER — CARVEDILOL 25 MG PO TABS: 25.0000 mg | ORAL_TABLET | Freq: Two times a day (BID) | ORAL | 1 refills | Status: DC

## 2018-02-13 MED ORDER — ATORVASTATIN CALCIUM 10 MG PO TABS: 10.0000 mg | ORAL_TABLET | Freq: Every day | ORAL | 1 refills | Status: DC

## 2018-02-13 NOTE — Patient Instructions (Addendum)
  HEALTH MAINTENANCE RECOMMENDATIONS:  It is recommended that you get at least 30 minutes of aerobic exercise at least 5 days/week (for weight loss, you may need as much as 60-90 minutes). This can be any activity that gets your heart rate up. This can be divided in 10-15 minute intervals if needed, but try and build up your endurance at least once a week.  Weight bearing exercise is also recommended twice weekly.  Eat a healthy diet with lots of vegetables, fruits and fiber.  "Colorful" foods have a lot of vitamins (ie green vegetables, tomatoes, red peppers, etc).  Limit sweet tea, regular sodas and alcoholic beverages, all of which has a lot of calories and sugar.  Up to 2 alcoholic drinks daily may be beneficial for men (unless trying to lose weight, watch sugars).  Drink a lot of water.  Sunscreen of at least SPF 30 should be used on all sun-exposed parts of the skin when outside between the hours of 10 am and 4 pm (not just when at beach or pool, but even with exercise, golf, tennis, and yard work!)  Use a sunscreen that says "broad spectrum" so it covers both UVA and UVB rays, and make sure to reapply every 1-2 hours.  Remember to change the batteries in your smoke detectors when changing your clock times in the spring and fall.  Use your seat belt every time you are in a car, and please drive safely and not be distracted with cell phones and texting while driving.    Peter [name] , Thank you for taking time to come for your Medicare Wellness Visit. I appreciate your ongoing commitment to your health goals. Please review the following plan we discussed and let me know if I can assist you in the future.   These are the goals we discussed: Goals    None      This is a list of the screening recommended for you and due dates:  Health Maintenance  Topic Date Due  . Hemoglobin A1C  06/19/2017  . Complete foot exam   12/23/2017  . Flu Shot  03/09/2018  . Eye exam for diabetics  04/19/2018    . Tetanus Vaccine  05/05/2021  . Pneumonia vaccines  Completed   Your A1c and foot exam were done today.  I recommend getting the new shingles vaccine (Shingrix). You will need to check with your insurance to see if it is covered, and if covered by Medicare Part D, you need to get from the pharmacy rather than our office.  It is a series of 2 injections, spaced 2 months apart.

## 2018-02-14 ENCOUNTER — Other Ambulatory Visit: Payer: Medicare Other

## 2018-02-14 ENCOUNTER — Other Ambulatory Visit: Payer: Self-pay | Admitting: Family Medicine

## 2018-02-14 DIAGNOSIS — R7989 Other specified abnormal findings of blood chemistry: Secondary | ICD-10-CM | POA: Diagnosis not present

## 2018-02-14 DIAGNOSIS — E119 Type 2 diabetes mellitus without complications: Secondary | ICD-10-CM | POA: Diagnosis not present

## 2018-02-14 DIAGNOSIS — E7800 Pure hypercholesterolemia, unspecified: Secondary | ICD-10-CM

## 2018-02-14 DIAGNOSIS — I10 Essential (primary) hypertension: Secondary | ICD-10-CM | POA: Diagnosis not present

## 2018-02-14 LAB — MICROALBUMIN / CREATININE URINE RATIO
CREATININE, UR: 104.8 mg/dL
Microalb/Creat Ratio: <2.9 mg/g creat (ref 0.0–30.0)
Microalbumin, Urine: <3 ug/mL

## 2018-02-15 LAB — COMPREHENSIVE METABOLIC PANEL
A/G RATIO: 2.3 — AB (ref 1.2–2.2)
ALT: 15 IU/L (ref 0–44)
AST: 23 IU/L (ref 0–40)
Albumin: 4.3 g/dL (ref 3.5–4.7)
Alkaline Phosphatase: 60 IU/L (ref 39–117)
BUN/Creatinine Ratio: 19 (ref 10–24)
BUN: 22 mg/dL (ref 8–27)
Bilirubin Total: 0.4 mg/dL (ref 0.0–1.2)
CALCIUM: 9.1 mg/dL (ref 8.6–10.2)
CO2: 24 mmol/L (ref 20–29)
Chloride: 105 mmol/L (ref 96–106)
Creatinine, Ser: 1.15 mg/dL (ref 0.76–1.27)
GFR calc Af Amer: 67 mL/min/{1.73_m2} (ref >59)
GFR, EST NON AFRICAN AMERICAN: 58 mL/min/{1.73_m2} — AB (ref >59)
GLUCOSE: 119 mg/dL — AB (ref 65–99)
Globulin, Total: 1.9 g/dL (ref 1.5–4.5)
POTASSIUM: 4.9 mmol/L (ref 3.5–5.2)
Sodium: 144 mmol/L (ref 134–144)
Total Protein: 6.2 g/dL (ref 6.0–8.5)

## 2018-02-15 LAB — LIPID PANEL
CHOL/HDL RATIO: 3.6 ratio (ref 0.0–5.0)
Cholesterol, Total: 161 mg/dL (ref 100–199)
HDL: 45 mg/dL (ref >39)
LDL Calculated: 100 mg/dL — ABNORMAL HIGH (ref 0–99)
Triglycerides: 81 mg/dL (ref 0–149)
VLDL Cholesterol Cal: 16 mg/dL (ref 5–40)

## 2018-02-15 LAB — TSH: TSH: 4.36 u[IU]/mL (ref 0.450–4.500)

## 2018-02-16 DIAGNOSIS — Z1211 Encounter for screening for malignant neoplasm of colon: Secondary | ICD-10-CM | POA: Diagnosis not present

## 2018-02-18 LAB — FECAL OCCULT BLOOD, IMMUNOCHEMICAL: Fecal Occult Bld: NEGATIVE

## 2018-03-14 ENCOUNTER — Ambulatory Visit: Payer: Medicare Other | Admitting: Sports Medicine

## 2018-04-04 ENCOUNTER — Ambulatory Visit: Payer: Medicare Other | Admitting: Cardiovascular Disease

## 2018-04-04 ENCOUNTER — Encounter: Payer: Self-pay | Admitting: Cardiovascular Disease

## 2018-04-04 VITALS — BP 142/62 | HR 60 | Ht 67.0 in | Wt 183.8 lb

## 2018-04-04 DIAGNOSIS — E782 Mixed hyperlipidemia: Secondary | ICD-10-CM | POA: Diagnosis not present

## 2018-04-04 DIAGNOSIS — I10 Essential (primary) hypertension: Secondary | ICD-10-CM | POA: Diagnosis not present

## 2018-04-04 MED ORDER — LOSARTAN POTASSIUM 50 MG PO TABS: 50.0000 mg | ORAL_TABLET | Freq: Every day | ORAL | 3 refills | Status: DC

## 2018-04-04 MED ORDER — ATORVASTATIN CALCIUM 20 MG PO TABS: 20.0000 mg | ORAL_TABLET | Freq: Every day | ORAL | 3 refills | Status: DC

## 2018-04-04 NOTE — Patient Instructions (Signed)
Medication Instructions:  1) INCREASE Losartan to 50mg  once daily 2) INCREASE Atorvastatin to 20mg  once daily  Labwork: Your physician recommends that you return for lab work in: 3 weeks (BMET)  Your physician recommends that you return for lab work in: 3 months (Lipid, liver, BMET).  Please make sure you come fasting for these labs (nothing to eat or drink after midnight except water and black coffee).    Testing/Procedures: None  Follow-Up: Your physician wants you to follow-up in: 1 year with Dr. Acie Fredrickson.  You will receive a reminder letter in the mail two months in advance. If you don't receive a letter, please call our office to schedule the follow-up appointment.   Any Other Special Instructions Will Be Listed Below (If Applicable).     If you need a refill on your cardiac medications before your next appointment, please call your pharmacy.

## 2018-04-04 NOTE — Progress Notes (Signed)
Peter [name] Date of Birth  Jul 23, 1930 [facility] / [facility] [address]     [place], [place]  [address] [phone]  Fax  [phone]   Problems 1. Htn 2. DM 3. Hyperlipidemia 4. Elevated coronary calcium 5.  Mild carotid artery disease ( 40-60 % bilaterally)    84 yo patient of Dr. Verlon Setting.  Hx of diabetes, HTN, dyslipidemia, and elevated coronary calcium score.  Also has mild-moderate carotid disease.  Is active but he doesn't walk regularly.  Has a cabin in [place].  No chest pain or dyspnea with his usual activities.    Oct. 3, 2013 -  He has not had any cardiac problems - no chest pain.  He stays avtive but does not walk regularly.  He describes some symptoms c/w claudication but has great pulses in his feet.  His systolic BP has been a bit elevated.  November 07, 2012:  Gene is doing well.  He has been going up to his cabin in [place] periodically.    He measures his BP regularly - some times his systolic readings are a bit high.  He wants to start walking regularly.    He does have some chest tightness when he walks.  He has had stress nuclear tests with Dr Verlon Setting which were negative.  He also has a hiatal hernia and esophageal spasm ( according to radiologist)  .  Aug 13 2013:  Gene has done well.  He still has some mild chest discomfort with exertion - especially after he has eaten.    The pain is relieved with Tums.   He has known esophageal spasm. The pain is a  Mild tightness.  He had a myoview study 8 years ago ( Dr. Verlon Setting) which was negative.  He is not inclined to repeat the [facility] unless it is absolutely necessary.   He still gets up to his [place] house in [place].    He is able to work up there for hours without CP or dyspnea.    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  Sept. 9, 2015:  Still active and able to do all of his normal activities. He had a  lifeline screening done.  He has very mild carotid plaque.  We have known about this mild plaque. He also has some symptoms of orthostasis.   February 12, 2016:  Felling well. No CP or dyspnea. Has had a cough since April . Is trying lots of breathing meds.  Is fatigued. Has orthostasis on occasion .  Weakness  in his legs .  Aug. 22, 2018   Gene is seen today .  Has had some eye surgery  No CP Still gets fatigued. ,   Walking up driveway with garbage cans causes fatigue  Overall seems to be feeling fairly well.  April 04, 2018:  Gene is seen today for follow-up visit.  He seems to be doing well. More fatigue No CP ,       Current Outpatient Medications on File Prior to Visit  Medication Sig Dispense Refill  . aspirin EC 81 MG tablet Take 81 mg by mouth daily.    Marland Kitchen atorvastatin (LIPITOR) 10 MG tablet Take 1 tablet (10 mg total) by mouth daily. 90 tablet 1  . carvedilol (COREG) 25 MG tablet Take 1 tablet (25 mg total) by mouth 2 (two) times daily. [place]  tablet 1  . fluticasone (FLONASE) 50 MCG/ACT nasal spray Place 2 sprays into both nostrils daily. 32 g 5  . losartan (COZAAR) 25 MG tablet Take 25 mg by mouth daily.     Current Facility-Administered Medications on File Prior to Visit  Medication Dose Route Frequency Provider Last Rate Last Dose  . incobotulinumtoxinA (XEOMIN) 50 units injection 50 Units  50 Units Intramuscular Q90 days [name], MD   50 Units at 03/09/17 1628   he is not taking the norvasc currently.   Allergies  Allergen Reactions  . Iodine Other (See Comments)    Feels like he is "burning up"    Past Medical History:  Diagnosis Date  . Adenomatous colon polyp 07/2013  . Asthma h/o as a child-s/p immunotherapy  . Diabetes mellitus diet controlled  . 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 FHx: colon cancer   . Hiatal hernia   . Hypercholesteremia   . Hypertension age 89  . Internal hemorrhoids 07/2013   noted on colonoscopy  . Restlessness and agitation  08/13/2014    Past Surgical History:  Procedure Laterality Date  . APPENDECTOMY    . BLEPHAROPLASTY Bilateral 12/13/2016  . CARDIOVASCULAR STRESS TEST  normal  . CATARACT EXTRACTION, BILATERAL  07/2014, 08/2014  . COLONOSCOPY  3/08, 07/2013   no repeat rec due to age per [name]  . VASECTOMY      Social History   Tobacco Use  Smoking Status Former Smoker  . Last attempt to quit: 08/09/1974  . Years since quitting: 43.6  Smokeless Tobacco Never Used    Social History   Substance and Sexual Activity  Alcohol Use Yes  . Alcohol/week: 0.0 standard drinks   Comment: 1-2 drinks daily,, 6 days/week    Family History  Problem Relation Age of Onset  . Alcohol abuse Father   . Cancer Sister        stomach/?pancreatic  . Colon cancer Brother 8  . Cancer Brother        colon cancer in mid 55's  . Arthritis Daughter        psoriatic  . Hyperlipidemia Son   . Arthritis Daughter        psoriatic  . Hyperlipidemia Son   . Diabetes Maternal Grandmother   . Stroke Neg Hx   . Heart disease Neg Hx     Reviw of Systems:  Reviewed in the HPI.  All other systems are negative.  Physical Exam: Blood pressure (!) 142/62, pulse 60, height 5\' 7"  (1.702 m), weight 183 lb 12.8 oz (83.4 kg), SpO2 95 %.  GEN:  Well nourished, well developed in no acute distress HEENT: Normal NECK: No JVD; No carotid bruits LYMPHATICS: No lymphadenopathy CARDIAC: RR  RESPIRATORY:  Clear to auscultation without rales, wheezing or rhonchi  ABDOMEN: Soft, non-tender, non-distended MUSCULOSKELETAL:  No edema; No deformity  SKIN: Warm and dry NEUROLOGIC:  Alert and oriented x 3  ECG:      Aug. 27, 2019:   NSR at 60.  Normal ECG    Assessment / Plan:   1. Htn-    blood pressure is mildly elevated today.  We will increase the losartan to 50 mg a day.  Basic metabolic profile in 3 weeks.  2. DM -post level is mildly elevated.  Hemoglobin A1c is mildly elevated.  Advised him to watch his  carbs.  3.  Hyperlipidemia -   4. Elevated coronary calcium - no angina ,  Continue aggressive lipid management  Asymptomatic, 84 yo ,    I do not think he needs further stress testing at this point   5.  Mild carotid artery disease ( 40-60 % bilaterally)       Mertie Moores, MD  04/04/2018 8:35 AM    [facility] [place],  [place], [place]  [address] Pager [phone] Phone: [phone]; Fax: [phone]

## 2018-04-25 ENCOUNTER — Other Ambulatory Visit: Payer: Medicare Other

## 2018-04-25 DIAGNOSIS — I10 Essential (primary) hypertension: Secondary | ICD-10-CM | POA: Diagnosis not present

## 2018-04-25 LAB — BASIC METABOLIC PANEL
BUN / CREAT RATIO: 18 (ref 10–24)
BUN: 22 mg/dL (ref 8–27)
CALCIUM: 9.3 mg/dL (ref 8.6–10.2)
CHLORIDE: 105 mmol/L (ref 96–106)
CO2: 24 mmol/L (ref 20–29)
CREATININE: 1.22 mg/dL (ref 0.76–1.27)
GFR, EST AFRICAN AMERICAN: 62 mL/min/{1.73_m2} (ref >59)
GFR, EST NON AFRICAN AMERICAN: 53 mL/min/{1.73_m2} — AB (ref >59)
Glucose: 118 mg/dL — ABNORMAL HIGH (ref 65–99)
Potassium: 5.6 mmol/L — ABNORMAL HIGH (ref 3.5–5.2)
Sodium: 144 mmol/L (ref 134–144)

## 2018-05-10 ENCOUNTER — Other Ambulatory Visit: Payer: Self-pay | Admitting: Family Medicine

## 2018-05-10 NOTE — Telephone Encounter (Signed)
Is this okay to refill? 

## 2018-05-17 ENCOUNTER — Telehealth: Payer: Self-pay | Admitting: Neurology

## 2018-05-17 NOTE — Telephone Encounter (Signed)
Patient calling to discuss having Botox again.

## 2018-05-17 NOTE — Telephone Encounter (Signed)
Spoke to patient - says he feels his previous injections have been beneficial for his symptoms.  He is aware that we will discuss with Dr. Krista Blue and call him back to schedule an appt.

## 2018-05-17 NOTE — Telephone Encounter (Signed)
Unable to reach patient.  Left a message requesting a return call.

## 2018-05-18 NOTE — Telephone Encounter (Signed)
Per vo by Dr. Krista Blue, clear Xeomin 50 units with insurance first (for his right hemifacial spasms).  Once approved, he can be placed on her scheduled for the injections.

## 2018-05-24 ENCOUNTER — Other Ambulatory Visit: Payer: Medicare Other | Admitting: *Deleted

## 2018-05-24 ENCOUNTER — Telehealth: Payer: Self-pay | Admitting: *Deleted

## 2018-05-24 DIAGNOSIS — E875 Hyperkalemia: Secondary | ICD-10-CM | POA: Diagnosis not present

## 2018-05-24 DIAGNOSIS — I10 Essential (primary) hypertension: Secondary | ICD-10-CM

## 2018-05-24 DIAGNOSIS — E782 Mixed hyperlipidemia: Secondary | ICD-10-CM

## 2018-05-24 DIAGNOSIS — E7800 Pure hypercholesterolemia, unspecified: Secondary | ICD-10-CM

## 2018-05-24 NOTE — Telephone Encounter (Signed)
BMET order requested to be placed on this pt, for he is scheduled to have this done, as ordered by Dr Acie Fredrickson on 04/25/18, for noted hyperkalemia. Also pt is scheduled to have another BMET done on 11/25, as well as lipids and hepatic panel.  That BMET was placed as well.  Lab aware that this is visible and able to link.

## 2018-05-25 LAB — BASIC METABOLIC PANEL
BUN/Creatinine Ratio: 20 (ref 10–24)
BUN: 24 mg/dL (ref 8–27)
CO2: 25 mmol/L (ref 20–29)
Calcium: 8.9 mg/dL (ref 8.6–10.2)
Chloride: 106 mmol/L (ref 96–106)
Creatinine, Ser: 1.19 mg/dL (ref 0.76–1.27)
GFR calc Af Amer: 64 mL/min/{1.73_m2} (ref >59)
GFR calc non Af Amer: 55 mL/min/{1.73_m2} — ABNORMAL LOW (ref >59)
Glucose: 140 mg/dL — ABNORMAL HIGH (ref 65–99)
Potassium: 4.8 mmol/L (ref 3.5–5.2)
Sodium: 143 mmol/L (ref 134–144)

## 2018-05-26 NOTE — Telephone Encounter (Signed)
His appt has been moved.

## 2018-05-26 NOTE — Telephone Encounter (Signed)
Could you please put him on for November 4th at 10:30am?

## 2018-05-26 NOTE — Telephone Encounter (Signed)
Patient called back and requested to change it to 11/11 at 10:30.

## 2018-06-12 NOTE — Telephone Encounter (Signed)
I called the patients insurance to check authorization requirements for the [phone] and [phone] codes. No authorization is required for the codes ref#Joy G. 2018/06/14. DW

## 2018-06-19 ENCOUNTER — Encounter: Payer: Self-pay | Admitting: Neurology

## 2018-06-19 ENCOUNTER — Ambulatory Visit: Payer: Medicare Other | Admitting: Neurology

## 2018-06-19 VITALS — BP 117/59 | HR 61 | Ht 67.0 in | Wt 185.0 lb

## 2018-06-19 DIAGNOSIS — G5139 Clonic hemifacial spasm, unspecified: Secondary | ICD-10-CM | POA: Diagnosis not present

## 2018-06-19 MED ORDER — INCOBOTULINUMTOXINA 50 UNITS IM SOLR
50.0000 [IU] | INTRAMUSCULAR | Status: DC
  Administered 2018-06-19: 50 [IU] via INTRAMUSCULAR

## 2018-06-19 NOTE — Addendum Note (Signed)
Addended by: [name] on: 06/19/2018 11:16 AM   Modules accepted: Orders

## 2018-06-19 NOTE — Progress Notes (Signed)
PATIENT: Peter [name] DOB: 1929/12/15  Chief Complaint  Patient presents with  . Hemifacial Spasms    Xeomin 50 units - office supply     HISTORICAL  Peter [name] is 84 years old right-handed male, accompanied by his wife, seen in refer by Dr. Brett Fairy for evaluation of EMG guided botulism toxin injection for right hemifacial spasm  I reviewed and summarized his medical history, he had a history of hypertension hyperlipidemia, bilateral cataract surgery, around 2015, he noticed right facial muscle spasm, initially mainly involving muscle around his right eye, over the past few years, gradually getting worse, more frequent, occasionally blocking his right vision, also spreading to right cheek, right upper mouth corner, is present 25% daytime, he also has bilateral senior ptosis, occasionally blocking upper vision, was suggested bilateral blepharoplasty by his ophthalmologist.   He denies a history of right facial palsy, no visual loss, no hearing loss,  He complains of chronic cough for few months, He also complains of chronic low back pain, knee pain, mild gait abnormality.  Update April 01 2016:  We have personally reviewed MRI of brain in July 2017, there is evidence of generalized atrophy, supratentorium small vessel disease, no acute abnormalities.  We reviewed that he has vascular risk factor of aging, hypertension, hyperlipidemia, he should take baby aspirin daily.  Today is his first EMG guided Xeomin injection for right hemifacial spasm, all the questions were answered, potential side effect was explained  UPDATE Jul 07 2016: He responded very well to previous EMG guided Xeomin injection in August 2017 for right hemifacial spasm, the same time he was evaluated by ophthalmologist, is planning on to have right blepharoplasty for redundant tissue at the right upper eyelid.  UPDATE March 09 2017: He had bilateral blepharoplasty, also tear duct procedure, only  recent few weeks, he noticed recurrent right eye, and cheek muscle twitching, last Xeomin injection was on July 07 7016.  UPDATE Jun 15 2017: He did very well with previous injection, there is no significant side effect noticed.  UPDATE Jun 19 2018: Last injection was in 2018, he did very well.  REVIEW OF SYSTEMS: Full 14 system review of systems performed and notable only for as above ALLERGIES: Allergies  Allergen Reactions  . Iodine Other (See Comments)    Feels like he is "burning up"    HOME MEDICATIONS: Current Outpatient Medications  Medication Sig Dispense Refill  . aspirin EC 81 MG tablet Take 81 mg by mouth daily.    Marland Kitchen atorvastatin (LIPITOR) 20 MG tablet Take 1 tablet (20 mg total) by mouth daily. 90 tablet 3  . carvedilol (COREG) 25 MG tablet Take 1 tablet (25 mg total) by mouth 2 (two) times daily. 180 tablet 1  . fluticasone (FLONASE) 50 MCG/ACT nasal spray Place 2 sprays into both nostrils daily. 32 g 5  . incobotulinumtoxinA (XEOMIN) 50 units SOLR injection Inject 50 Units into the muscle every 3 (three) months.    Marland Kitchen losartan (COZAAR) 50 MG tablet Take 1 tablet (50 mg total) by mouth daily. 90 tablet 3  . VENTOLIN HFA 108 (90 Base) MCG/ACT inhaler INHALE 2 PUFFS EVERY 6 HOURS AS NEEDED FOR WHEEZING OR SHORTNESS OF BREATH. 18 g 0   No current facility-administered medications for this visit.     PAST MEDICAL HISTORY: Past Medical History:  Diagnosis Date  . Adenomatous colon polyp 07/2013  . Asthma h/o as a child-s/p immunotherapy  . Diabetes mellitus diet controlled  . Facial  nerve spasm 08/13/2014  . FHx: colon cancer   . Hiatal hernia   . Hypercholesteremia   . Hypertension age 27  . Internal hemorrhoids 07/2013   noted on colonoscopy  . Restlessness and agitation 08/13/2014    PAST SURGICAL HISTORY: Past Surgical History:  Procedure Laterality Date  . APPENDECTOMY    . BLEPHAROPLASTY Bilateral 12/13/2016  . CARDIOVASCULAR STRESS TEST  normal  .  CATARACT EXTRACTION, BILATERAL  07/2014, 08/2014  . COLONOSCOPY  3/08, 07/2013   no repeat rec due to age per [name]  . VASECTOMY      FAMILY HISTORY: Family History  Problem Relation Age of Onset  . Alcohol abuse Father   . Cancer Sister        stomach/?pancreatic  . Colon cancer Brother 31  . Cancer Brother        colon cancer in mid 70's  . Arthritis Daughter        psoriatic  . Hyperlipidemia Son   . Arthritis Daughter        psoriatic  . Hyperlipidemia Son   . Diabetes Maternal Grandmother   . Stroke Neg Hx   . Heart disease Neg Hx     SOCIAL HISTORY:  Social History   Socioeconomic History  . Marital status: Married    Spouse name: Not on file  . Number of children: 8  . Years of education: Not on file  . Highest education level: Not on file  Occupational History  . 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 Financial resource strain: Not on file  . Food insecurity:    Worry: Not on file    Inability: Not on file  . Transportation needs:    Medical: Not on file    Non-medical: Not on file  Tobacco Use  . Smoking status: Former Smoker    Last attempt to quit: 08/09/1974    Years since quitting: 43.8  . Smokeless tobacco: Never Used  Substance and Sexual Activity  . Alcohol use: Yes    Alcohol/week: 0.0 standard drinks    Comment: 1-2 drinks daily,, 6 days/week  . Drug use: No  . Sexual activity: Yes    Partners: Female  Lifestyle  . Physical activity:    Days per week: Not on file    Minutes per session: Not on file  . Stress: Not on file  Relationships  . Social connections:    Talks on phone: Not on file    Gets together: Not on file    Attends religious service: Not on file    Active member of club or organization: Not on file    Attends meetings of clubs or organizations: Not on file    Relationship status: Not on file  . Intimate partner violence:    Fear of current or ex partner: Not on file    Emotionally abused: Not on  file    Physically abused: Not on file    Forced sexual activity: Not on file  Other Topics Concern  . Not on file  Social History Narrative   Lives with wife, dog.  Children live in [place], [place], [place], [place], and 2 daughters here in [place], [place]. 15 grandchildren, 6 great-grandchildren   Patient is right handed.  And drinks caffeine daily.     PHYSICAL EXAM   Vitals:   06/19/18 1033  BP: (!) 117/59  Pulse: 61  Weight: 185 lb (83.9 kg)  Height: 5\' 7"  (1.702 m)  Not recorded      Body mass index is 28.98 kg/m.  PHYSICAL EXAMNIATION:  Gen: NAD, conversant, well nourised, obese, well groomed                     Cardiovascular: Regular rate rhythm, no peripheral edema, warm, nontender. Eyes: Conjunctivae clear without exudates or hemorrhage Neck: Supple, no carotid bruise. Pulmonary: Clear to auscultation bilaterally   NEUROLOGICAL EXAM:  MENTAL STATUS: Speech:    Speech is normal; fluent and spontaneous with normal comprehension.  Cognition:     Orientation to time, place and person     Normal recent and remote memory     Normal Attention span and concentration     Normal Language, naming, repeating,spontaneous speech     Fund of knowledge   CRANIAL NERVES: CN II: Visual fields are full to confrontation. Fundoscopic exam is normal with sharp discs and no vascular changes. Pupils are round equal and briskly reactive to light. CN III, IV, VI: extraocular movement are normal. No ptosis. CN V: Facial sensation is intact to pinprick in all 3 divisions bilaterally. Corneal responses are intact.  CN VII: He rarely has right hemifacial muscle spasm, mainly involving right orbicularis oculi, occasional involvement of right cheek, upper mouth area CN VIII: Hearing is normal to rubbing fingers CN IX, X: Palate elevates symmetrically. Phonation is normal. CN XI: Head turning and shoulder shrug are intact CN XII: Tongue is midline with normal movements and no  atrophy.  MOTOR: There is no pronator drift of out-stretched arms. Muscle bulk and tone are normal. Muscle strength is normal.  REFLEXES: Reflexes are 2+ and symmetric at the biceps, triceps, knees, and ankles. Plantar responses are flexor.  SENSORY: Intact to light touch, pinprick, positional sensation and vibratory sensation are intact in fingers and toes.  COORDINATION: Rapid alternating movements and fine finger movements are intact. There is no dysmetria on finger-to-nose and heel-knee-shin.    GAIT/STANCE: He needs push up to get up from seated position, mildly limp, unsteady   DIAGNOSTIC DATA (LABS, IMAGING, TESTING) - I reviewed patient records, labs, notes, testing and imaging myself where available.   ASSESSMENT AND PLAN  Peter [name] is a 84 y.o. male   Right hemifacial spasm  EMG guided xeomin injection, 50 units unit, used 32.5 units, discard 17.5 units  Right orbicularis oculi at  4, 6, 8, 9, 10 clock, 2.5 x5 units each= 12.5 units Right corrugate 5 units Left corrugate 5 units Procerus 5 units  Left orbicularis oculi at 3, 4:00, 2.5 units each= 5 units  Cerebral Small vessel disease  He has vascular risk factor of aging, hypertension, hyperlipidemia  Have advised him start aspirin 81mg  daily   [name], M.D. Ph.D.  [facility] [address] [place], [place] [address] Ph: [phone] Fax: [phone]  CC: Rita Ohara, MD

## 2018-06-19 NOTE — Progress Notes (Addendum)
**  Xeomin 50 units x 1 vial, NDC 1308-6578-46, Lot [id number], Exp 03/2020, buy and bill pharmacy.//mck,rn**

## 2018-07-03 ENCOUNTER — Other Ambulatory Visit: Payer: Medicare Other | Admitting: *Deleted

## 2018-07-03 DIAGNOSIS — E875 Hyperkalemia: Secondary | ICD-10-CM

## 2018-07-03 DIAGNOSIS — E7800 Pure hypercholesterolemia, unspecified: Secondary | ICD-10-CM | POA: Diagnosis not present

## 2018-07-03 DIAGNOSIS — I10 Essential (primary) hypertension: Secondary | ICD-10-CM

## 2018-07-03 DIAGNOSIS — E782 Mixed hyperlipidemia: Secondary | ICD-10-CM | POA: Diagnosis not present

## 2018-07-03 LAB — HEPATIC FUNCTION PANEL
ALK PHOS: 76 IU/L (ref 39–117)
ALT: 14 IU/L (ref 0–44)
AST: 19 IU/L (ref 0–40)
Albumin: 4.4 g/dL (ref 3.5–4.7)
Bilirubin Total: 0.9 mg/dL (ref 0.0–1.2)
Bilirubin, Direct: 0.22 mg/dL (ref 0.00–0.40)
TOTAL PROTEIN: 6 g/dL (ref 6.0–8.5)

## 2018-07-03 LAB — LIPID PANEL
CHOL/HDL RATIO: 2.7 ratio (ref 0.0–5.0)
Cholesterol, Total: 145 mg/dL (ref 100–199)
HDL: 54 mg/dL (ref >39)
LDL Calculated: 73 mg/dL (ref 0–99)
Triglycerides: 90 mg/dL (ref 0–149)
VLDL CHOLESTEROL CAL: 18 mg/dL (ref 5–40)

## 2018-07-03 LAB — BASIC METABOLIC PANEL
BUN/Creatinine Ratio: 19 (ref 10–24)
BUN: 26 mg/dL (ref 8–27)
CHLORIDE: 103 mmol/L (ref 96–106)
CO2: 22 mmol/L (ref 20–29)
CREATININE: 1.39 mg/dL — AB (ref 0.76–1.27)
Calcium: 9.2 mg/dL (ref 8.6–10.2)
GFR calc Af Amer: 53 mL/min/{1.73_m2} — ABNORMAL LOW (ref >59)
GFR calc non Af Amer: 46 mL/min/{1.73_m2} — ABNORMAL LOW (ref >59)
GLUCOSE: 114 mg/dL — AB (ref 65–99)
Potassium: 5 mmol/L (ref 3.5–5.2)
SODIUM: 139 mmol/L (ref 134–144)

## 2018-07-11 ENCOUNTER — Telehealth: Payer: Self-pay

## 2018-07-11 NOTE — Telephone Encounter (Signed)
-----   Message from Thayer Headings, MD sent at 07/03/2018  5:38 PM EST ----- Chol levels are stable Liver enz are stable Creatinine is up slightly but not significantly  This may be due to being NPO for the blood draw

## 2018-07-11 NOTE — Telephone Encounter (Signed)
Pt advised lab results and reports that he has not been hydrating well and will be sure to drink enough fluids the next few days. Pt will call if he has any problems prior to his next appt summer 2020.

## 2018-07-11 NOTE — Telephone Encounter (Signed)
LMTCB for lab results.  

## 2018-07-13 ENCOUNTER — Other Ambulatory Visit: Payer: Medicare Other

## 2018-07-19 ENCOUNTER — Other Ambulatory Visit (INDEPENDENT_AMBULATORY_CARE_PROVIDER_SITE_OTHER): Payer: Medicare Other

## 2018-07-19 DIAGNOSIS — Z23 Encounter for immunization: Secondary | ICD-10-CM

## 2018-07-27 DIAGNOSIS — L570 Actinic keratosis: Secondary | ICD-10-CM | POA: Diagnosis not present

## 2018-07-27 DIAGNOSIS — D044 Carcinoma in situ of skin of scalp and neck: Secondary | ICD-10-CM | POA: Diagnosis not present

## 2018-07-27 DIAGNOSIS — L821 Other seborrheic keratosis: Secondary | ICD-10-CM | POA: Diagnosis not present

## 2018-07-27 DIAGNOSIS — D225 Melanocytic nevi of trunk: Secondary | ICD-10-CM | POA: Diagnosis not present

## 2018-07-27 DIAGNOSIS — Z85828 Personal history of other malignant neoplasm of skin: Secondary | ICD-10-CM | POA: Diagnosis not present

## 2018-08-07 ENCOUNTER — Encounter: Payer: Self-pay | Admitting: Family Medicine

## 2018-08-16 NOTE — Progress Notes (Deleted)
   Diabetes: He has never taken medications for diabetes (he tried Actos for a month or so many years ago with [name]).Denies polydipsia, polyuria. Doesn't really watch his diet much at all.He doesn't monitor his blood sugars. Lab Results  Component Value Date   HGBA1C 6.5 (A) 02/13/2018   Last diabetic eye exam 04/2017 DID HE GO 2019?? NEED RESULTS IF DID  He has been getting EMG guided Xeomin injections for right hemifacial spasm by Dr. Krista Blue.  She had also recommended that he start taking 81mg  of aspirin daily.  He has been doing very well, only rarely flares, "winks" with smiling for pictures.  TAKING ASA?  [place]ad f/u with Dr. Acie Fredrickson in August 2019, sees him yearly. Elevated coronary calcium - no angina. Some fatigue (trashcan up driveway), symptoms come and go x 20 years, not progressive.  He further increased his losartan dose to 50mg  at 03/2018 visit.  HTN-- Losartan dose increased to 50mg  by Dr. Acie Fredrickson in August.  Other meds remain the same. BP's have been Denies headaches, dizziness. He had labs done in November by Dr. Hector Brunswick bump in Cr, borderline K.   Chemistry      Component Value Date/Time   NA 139 07/03/2018 0931   K 5.0 07/03/2018 0931   CL 103 07/03/2018 0931   CO2 22 07/03/2018 0931   BUN 26 07/03/2018 0931   CREATININE 1.39 (H) 07/03/2018 0931   CREATININE 1.23 (H) 12/17/2016 0722      Component Value Date/Time   CALCIUM 9.2 07/03/2018 0931   ALKPHOS 76 07/03/2018 0931   AST 19 07/03/2018 0931   ALT 14 07/03/2018 0931   BILITOT 0.9 07/03/2018 0931       Hyperlipidemia follow-up: Compliant with medications and denies medication side effects.  Lab Results  Component Value Date   CHOL 145 07/03/2018   HDL 54 07/03/2018   LDLCALC 73 07/03/2018   TRIG 90 07/03/2018   CHOLHDL 2.7 07/03/2018     ROS: denies fever, chills, URI symptoms. Allergies are well controlled.  Denies cough, chest pain, dyspnea.  Indigestion 2x/month, relieved  by Tums He reports some fatigue Bowels and temperature tolerance are normal. See HPI    PHYSICAL EXAM:   Wt Readings from Last 3 Encounters:  06/19/18 185 lb (83.9 kg)  04/04/18 183 lb 12.8 oz (83.4 kg)  02/13/18 189 lb 12.8 oz (86.1 kg)    Well developed, pleasant male in no distress. HEENT: PERRL, EOMI, conjunctiva and sclera are clear. Nasal mucosa is normal, no drainage, no purulence or erythema. Occasional twitch of the right side of his face (milder and less frequent than prior to injections). Neck: no lymphadenopathy, thyromegaly or carotid bruit Heart: regular rate and rhythm Lungs: clear bilaterally, fair air movement, no wheezes, rales, ronchi Abdomen: soft, nontender, no mass Extremities: no edema, 2+ pulses Psych: normal mood, affect, hygiene and grooming Neuro: alert and oriented, normal strength, gait.  Skin: normal turgor, no rash.   ASSESSMENT/PLAN:  A1c  CPE 6 mos (fasting vs labs prior??)

## 2018-08-17 ENCOUNTER — Encounter: Payer: Self-pay | Admitting: Family Medicine

## 2018-09-20 ENCOUNTER — Ambulatory Visit: Payer: Self-pay | Admitting: Neurology

## 2018-10-26 ENCOUNTER — Other Ambulatory Visit: Payer: Self-pay | Admitting: Family Medicine

## 2018-10-26 DIAGNOSIS — J441 Chronic obstructive pulmonary disease with (acute) exacerbation: Secondary | ICD-10-CM

## 2018-10-26 NOTE — Telephone Encounter (Signed)
Couldn't find [facility] on current med list-patient said he did put in these requests. [place] have these on hand. His daughter will pick up and bring up this weekend.

## 2019-02-08 ENCOUNTER — Ambulatory Visit: Payer: Medicare Other | Admitting: Sports Medicine

## 2019-02-08 ENCOUNTER — Other Ambulatory Visit: Payer: Self-pay | Admitting: Family Medicine

## 2019-02-08 DIAGNOSIS — E119 Type 2 diabetes mellitus without complications: Secondary | ICD-10-CM

## 2019-02-08 DIAGNOSIS — I10 Essential (primary) hypertension: Secondary | ICD-10-CM

## 2019-02-08 DIAGNOSIS — Z5181 Encounter for therapeutic drug level monitoring: Secondary | ICD-10-CM

## 2019-02-13 ENCOUNTER — Other Ambulatory Visit: Payer: Medicare Other

## 2019-02-13 ENCOUNTER — Other Ambulatory Visit: Payer: Self-pay

## 2019-02-13 DIAGNOSIS — I10 Essential (primary) hypertension: Secondary | ICD-10-CM | POA: Diagnosis not present

## 2019-02-13 DIAGNOSIS — Z5181 Encounter for therapeutic drug level monitoring: Secondary | ICD-10-CM | POA: Diagnosis not present

## 2019-02-13 DIAGNOSIS — E119 Type 2 diabetes mellitus without complications: Secondary | ICD-10-CM | POA: Diagnosis not present

## 2019-02-14 LAB — COMPREHENSIVE METABOLIC PANEL
ALT: 18 IU/L (ref 0–44)
AST: 21 IU/L (ref 0–40)
Albumin/Globulin Ratio: 2.5 — ABNORMAL HIGH (ref 1.2–2.2)
Albumin: 4.5 g/dL (ref 3.6–4.6)
Alkaline Phosphatase: 58 IU/L (ref 39–117)
BUN/Creatinine Ratio: 19 (ref 10–24)
BUN: 24 mg/dL (ref 8–27)
Bilirubin Total: 0.7 mg/dL (ref 0.0–1.2)
CO2: 22 mmol/L (ref 20–29)
Calcium: 8.9 mg/dL (ref 8.6–10.2)
Chloride: 104 mmol/L (ref 96–106)
Creatinine, Ser: 1.26 mg/dL (ref 0.76–1.27)
GFR calc Af Amer: 59 mL/min/{1.73_m2} — ABNORMAL LOW (ref >59)
GFR calc non Af Amer: 51 mL/min/{1.73_m2} — ABNORMAL LOW (ref >59)
Globulin, Total: 1.8 g/dL (ref 1.5–4.5)
Glucose: 121 mg/dL — ABNORMAL HIGH (ref 65–99)
Potassium: 4.6 mmol/L (ref 3.5–5.2)
Sodium: 139 mmol/L (ref 134–144)
Total Protein: 6.3 g/dL (ref 6.0–8.5)

## 2019-02-14 LAB — MICROALBUMIN / CREATININE URINE RATIO
Creatinine, Urine: 141.1 mg/dL
Microalb/Creat Ratio: 6 mg/g creat (ref 0–29)
Microalbumin, Urine: 9 ug/mL

## 2019-02-14 LAB — CBC WITH DIFFERENTIAL/PLATELET
Basophils Absolute: 0.1 10*3/uL (ref 0.0–0.2)
Basos: 1 %
EOS (ABSOLUTE): 0.5 10*3/uL — ABNORMAL HIGH (ref 0.0–0.4)
Eos: 9 %
Hematocrit: 35.4 % — ABNORMAL LOW (ref 37.5–51.0)
Hemoglobin: 12.5 g/dL — ABNORMAL LOW (ref 13.0–17.7)
Immature Grans (Abs): 0 10*3/uL (ref 0.0–0.1)
Immature Granulocytes: 1 %
Lymphocytes Absolute: 1.2 10*3/uL (ref 0.7–3.1)
Lymphs: 21 %
MCH: 34.5 pg — ABNORMAL HIGH (ref 26.6–33.0)
MCHC: 35.3 g/dL (ref 31.5–35.7)
MCV: 98 fL — ABNORMAL HIGH (ref 79–97)
Monocytes Absolute: 0.8 10*3/uL (ref 0.1–0.9)
Monocytes: 14 %
Neutrophils Absolute: 3.1 10*3/uL (ref 1.4–7.0)
Neutrophils: 54 %
Platelets: 216 10*3/uL (ref 150–450)
RBC: 3.62 x10E6/uL — ABNORMAL LOW (ref 4.14–5.80)
RDW: 12.2 % (ref 11.6–15.4)
WBC: 5.7 10*3/uL (ref 3.4–10.8)

## 2019-02-14 LAB — HEMOGLOBIN A1C
Est. average glucose Bld gHb Est-mCnc: 131 mg/dL
Hgb A1c MFr Bld: 6.2 % — ABNORMAL HIGH (ref 4.8–5.6)

## 2019-02-14 LAB — TSH: TSH: 5.06 u[IU]/mL — ABNORMAL HIGH (ref 0.450–4.500)

## 2019-02-14 NOTE — Patient Instructions (Addendum)
  HEALTH MAINTENANCE RECOMMENDATIONS:  It is recommended that you get at least 30 minutes of aerobic exercise at least 5 days/week (for weight loss, you may need as much as 60-90 minutes). This can be any activity that gets your heart rate up. This can be divided in 10-15 minute intervals if needed, but try and build up your endurance at least once a week.  Weight bearing exercise is also recommended twice weekly.  Eat a healthy diet with lots of vegetables, fruits and fiber.  "Colorful" foods have a lot of vitamins (ie green vegetables, tomatoes, red peppers, etc).  Limit sweet tea, regular sodas and alcoholic beverages, all of which has a lot of calories and sugar.  Up to 2 alcoholic drinks daily may be beneficial for men (unless trying to lose weight, watch sugars).  Drink a lot of water.  Sunscreen of at least SPF 30 should be used on all sun-exposed parts of the skin when outside between the hours of 10 am and 4 pm (not just when at beach or pool, but even with exercise, golf, tennis, and yard work!)  Use a sunscreen that says "broad spectrum" so it covers both UVA and UVB rays, and make sure to reapply every 1-2 hours.  Remember to change the batteries in your smoke detectors when changing your clock times in the spring and fall.  Use your seat belt every time you are in a car, and please drive safely and not be distracted with cell phones and texting while driving.    Peter [name] , Thank you for taking time to come for your Medicare Wellness Visit. I appreciate your ongoing commitment to your health goals. Please review the following plan we discussed and let me know if I can assist you in the future.    This is a list of the screening recommended for you and due dates:  Health Maintenance  Topic Date Due  . Eye exam for diabetics  04/19/2018  . Complete foot exam   02/14/2019  . Flu Shot  03/10/2019  . Hemoglobin A1C  08/16/2019  . Tetanus Vaccine  05/05/2021  . Pneumonia vaccines   Completed   Continue yearly diabetic eye exams (we will call Dr. Ammie Ferrier office to get your last visit). Please make sure that eye doctors knows you have diabetes (since you don't take medications for this), and to send my office a report. You are now due for a diabetic foot exam--we will do this at your next in-office visit (6 months); please schedule a visit immediately if you have any foot concerns.  I recommend getting the new shingles vaccine (Shingrix). You will need to get this vaccine from the pharmacy rather than our office, as it is covered by Medicare Part D.  It is a series of 2 injections, spaced 2 months apart.  Your thyroid test is borderline underactive.  Please be sure to contact us if you develop any symptoms related to this--worsening fatigue, hair loss, dry skin, brittle nails, constipation, feeling cold all the time, depression, weight gain.  See you in 6 months!  Please call Dr. Elmarie Shiley office to schedule your yearly appointment (due in August)

## 2019-02-14 NOTE — Progress Notes (Signed)
Start time: 9:58 End time: 10:46  Virtual Visit via Video Note  I connected with Peter [name] on 02/15/2019 by a video enabled telemedicine application and verified that I am speaking with the correct person using two identifiers.  Location: Patient: home; alone for most of the visit, wife and son Gene present at the end (and son's questions were answered) Provider: office   I discussed the limitations of evaluation and management by telemedicine and the availability of in person appointments. The patient expressed understanding and agreed to proceed. Consents to insurance being filed for this visit.  History of Present Illness:  Chief Complaint  Patient presents with  . Medicare Wellness    VIRTUAL AWV. No concerns. I pended the order for the FIT. We can print and mail him everything and he would send everything, including the order back to [facility] (postage paid).     Patient presents for Medicare Annual Wellness Visit and follow up on chronic problems.  He was last seen here a year ago. He missed his 6 month follow-up visit (his wife thought it was her appt, so she came instead of her husband, and husband didn't reschedule the visit). He had labwork done prior to his visit, see below.  Diabetes: He has never taken medications for diabetes (he tried Actos for a month or so many years ago with [name]).Denies polydipsia, polyuria. Doesn't really watch his diet much at all.Admits to eating sweets, as he has for many years, and sugars are always the same when tested at doctor. He doesn't monitor his blood sugars. Last diabetic eye exam in computer was in 2018. He states he had one a year ago, got new glasses (changed to wife's doctor, Dr. Marica Otter). Doesn't watch his diet, eats sweets, sugars haven't changed in many years. He reports checking his feet regularly, and denies numbness/tingling/pain or lesions/sores.    He had been getting EMG guided Xeomin injections for  right hemifacial spasm by Dr. Krista Blue with good results.  Last injection was 06/2018. Only has them prn, doing well.  Feels some twitching again, but mild, not significant enough for another injection yet.  [place] f/u with Dr. Acie Fredrickson in August 2019. Elevated coronary calcium - no angina. Gets fatigued walking up the driveway with trash can. Tums relieves the chest discomfort that he gets,which comes and goes and hasn't changed/progressed in over 20 years. Dr. Acie Fredrickson has not recommended further testing, but encourages continuedaggressive lipid management.Mild carotid artery disease (seen on [facility] screening 02/2014), stable. Denies any neurologic symptoms  He has been told he has a hiatal hernia, does get symptoms of heartburn/reflux, as well as the pain noted above (which is relieved with Tums).  HTN-- BP's have been "good" per pt, running 135-150/50-60, checks often. He states his BP's are always in this range (despite med change by cardiologist). His losartan dose was increased to 50mg  in 03/2018 by Dr. Acie Fredrickson.  Denies headaches, dizziness. F/u BP at neuro office was improved. BP Readings from Last 3 Encounters:  06/19/18 (!) 117/59  04/04/18 (!) 142/62  02/13/18 (!) 130/58   Hyperlipidemia follow-up: Compliant with medications and denies medication side effects.   Hip pain:  Has been seen by [name] for this, previously diagnosed with left greater trochanteric pain syndrome and leg length discrepancy, with left leg being about a centimeter shorter than the right leg.  He previously declined cortisone injection, was referred for PT, and given heel pad/lift for the left shoe. He was discharged for  noncompliance (per notes in chart), attending only of 2 of the 6 scheduled visits. Not doing home exercises. His balance doesn't seem as good since his last cataract surgery, but hasn't changed.  He has not fallen.  Fatigue unchanged, no thyroid sx. Mostly muscular   Immunization History   Administered Date(s) Administered  . Influenza Split 05/06/2011, 05/25/2012  . Influenza, High Dose Seasonal PF 05/21/2013, 05/22/2014, 06/25/2015, 04/15/2016, 05/04/2017, 07/19/2018  . Pneumococcal Conjugate-13 11/28/2013  . Pneumococcal Polysaccharide-23 08/09/2006, 04/15/2016  . Tdap 05/06/2011  . Zoster 06/01/2012   Last colonoscopy: 07/2013, [name], tubular adenoma; fecal occult blood negative last year Last PSA:  2.4 in 12/2016 Dentist: twice yearly  Ophtho: regularly Exercise: walking the dog, 10 minutes. Walks up and down the steep driveway during the day.  He has a living will and healthcare power of attorney  Fall Screen: negative Depression Screen: negative Functional Status survey: notable for stable hearing, loss; balance/walking trouble, slight short-term memory issue (usually names) Mini-Cog screen: normal (5)--clock visualized over video, normal See full questionnaires in epic   Other doctors caring for patient include: Sports Med: [name] ENT: Dr. Willa Frater for hearing loss  Neurology: Dr. Brett Fairy, Dr. Krista Blue (giving xeomin injections) Cardiology: Dr. Acie Fredrickson Ophtho: Dr. Claudean Kinds; changed this past year to Dr. Susanne Greenhouse office Dermatology: Dr. Elvera Lennox GI: [name]  Past Medical History:  Diagnosis Date  . Adenomatous colon polyp 07/2013  . Asthma h/o as a child-s/p immunotherapy  . Diabetes mellitus diet controlled  . 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 FHx: colon cancer   . Hiatal hernia   . Hypercholesteremia   . Hypertension age 8  . Internal hemorrhoids 07/2013   noted on colonoscopy  . Restlessness and agitation 08/13/2014    Past Surgical History:  Procedure Laterality Date  . APPENDECTOMY    . BLEPHAROPLASTY Bilateral 12/13/2016  . CARDIOVASCULAR STRESS TEST  normal  . CATARACT EXTRACTION, BILATERAL  07/2014, 08/2014  . COLONOSCOPY  3/08, 07/2013   no repeat rec due to age per [name]  . VASECTOMY      Social  History   Socioeconomic History  . Marital status: Married    Spouse name: Not on file  . Number of children: 8  . Years of education: Not on file  . Highest education level: Not on file  Occupational History  . 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 Financial resource strain: Not on file  . Food insecurity    Worry: Not on file    Inability: Not on file  . Transportation needs    Medical: Not on file    Non-medical: Not on file  Tobacco Use  . Smoking status: Former Smoker    Quit date: 08/09/1974    Years since quitting: 44.5  . Smokeless tobacco: Never Used  Substance and Sexual Activity  . Alcohol use: Yes    Alcohol/week: 0.0 standard drinks    Comment: 1 daily  . Drug use: No  . Sexual activity: Yes    Partners: Female  Lifestyle  . Physical activity    Days per week: Not on file    Minutes per session: Not on file  . Stress: Not on file  Relationships  . Social Herbalist on phone: Not on file    Gets together: Not on file    Attends religious service: Not on file    Active member of club or organization: Not on file  Attends meetings of clubs or organizations: Not on file    Relationship status: Not on file  . Intimate partner violence    Fear of current or ex partner: Not on file    Emotionally abused: Not on file    Physically abused: Not on file    Forced sexual activity: Not on file  Other Topics Concern  . Not on file  Social History Narrative   Lives with wife, dog.  Children live in [place], [place], [place], [place], and 2 daughters here in [place], [place], [place]. 16 grandchildren, 8-9 great-grandchildren   Patient is right handed.  And drinks caffeine daily.    Family History  Problem Relation Age of Onset  . Alcohol abuse Father   . Cancer Sister        stomach/?pancreatic  . Colon cancer Brother 107  . Cancer Brother        colon cancer in mid 15's  . Arthritis Daughter        psoriatic  . Hyperlipidemia Son   .  Arthritis Daughter        psoriatic  . Hyperlipidemia Son   . Diabetes Maternal Grandmother   . Cancer Sister        lung and brain  . Stroke Neg Hx   . Heart disease Neg Hx     Outpatient Encounter Medications as of 02/15/2019  Medication Sig  . aspirin EC 81 MG tablet Take 81 mg by mouth daily.  Marland Kitchen atorvastatin (LIPITOR) 20 MG tablet Take 1 tablet (20 mg total) by mouth daily.  . carvedilol (COREG) 25 MG tablet Take 1 tablet (25 mg total) by mouth 2 (two) times daily.  Marland Kitchen losartan (COZAAR) 50 MG tablet Take 1 tablet (50 mg total) by mouth daily.  Jearl Klinefelter ELLIPTA 62.5-25 MCG/INH AEPB INHALE 1 PUFF ONCE DAILY. (Patient not taking: Reported on 02/15/2019)  . fluticasone (FLONASE) 50 MCG/ACT nasal spray Place 2 sprays into both nostrils daily. (Patient not taking: Reported on 02/15/2019)  . VENTOLIN HFA 108 (90 Base) MCG/ACT inhaler INHALE 2 PUFFS EVERY 6 HOURS AS NEEDED FOR WHEEZING OR SHORTNESS OF BREATH. (Patient not taking: Reported on 02/15/2019)  . [DISCONTINUED] incobotulinumtoxinA (XEOMIN) 50 units SOLR injection Inject 50 Units into the muscle every 3 (three) months.   Facility-Administered Encounter Medications as of 02/15/2019  Medication  . incobotulinumtoxinA (XEOMIN) 50 units injection 50 Units    Allergies  Allergen Reactions  . Iodine Other (See Comments)    Feels like he is "burning up"    ROS: The patient denies anorexia, fever, headaches, ear pain, exertional chest pain, palpitations, syncope, dyspnea on exertion, swelling, nausea, vomiting, diarrhea, constipation, abdominal pain, melena, hematochezia, hematuria, incontinence, weakened urine stream, dysuria, genital lesions, numbness, tingling, weakness, tremor, suspicious skin lesions, depression, anxiety, abnormal bleeding/bruising, or enlarged lymph nodes. No insomnia +stable hearing loss.  Some indigestion after spicy foods and tomato sauce, relieved by Tums, infrequent; has known hiatal hernia. 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 Fatigue--unchanged, mainly relates to his muscles/joints.   Denies thyroid symptoms--no changes to hair/skin/bowels/moods/energy/weight. Breathing is good, hasn't needed to use albuterol in over a month.   Observations/Objective:  BP (!) 150/61   Pulse 64  Ht 5\' 7"  (1.702 m)   Wt 188 lb (85.3 kg)   BMI 29.44 kg/m '  Wt Readings from Last 3 Encounters:  06/19/18 185 lb (83.9 kg)  04/04/18 183 lb 12.8 oz (83.4 kg)  02/13/18 189 lb 12.8 oz (86.1 kg)   Alert, oriented, pleasant male in good spirits. He had some trouble hearing, so he held the phone (video) up to his ear for the later portion of the visit.   No spasm noted of face, cranial nerves grossly intact. Normal eye contact, speech. Exam is limited due to virtual nature of the visit.  Lab Results  Component Value Date   HGBA1C 6.2 (H) 02/13/2019     Chemistry      Component Value Date/Time   NA 139 02/13/2019 1016   K 4.6 02/13/2019 1016   CL 104 02/13/2019 1016   CO2 22 02/13/2019 1016   BUN 24 02/13/2019 1016   CREATININE 1.26 02/13/2019 1016   CREATININE 1.23 (H) 12/17/2016 0722      Component Value Date/Time   CALCIUM 8.9 02/13/2019 1016   ALKPHOS 58 02/13/2019 1016   AST 21 02/13/2019 1016   ALT 18 02/13/2019 1016   BILITOT 0.7 02/13/2019 1016     Fasting glucose 121  Lab Results  Component Value Date   TSH 5.060 (H) 02/13/2019   Lab Results  Component Value Date   CHOL 145 07/03/2018   HDL 54 07/03/2018   LDLCALC 73 07/03/2018   TRIG 90 07/03/2018   CHOLHDL 2.7 07/03/2018   Urine microalbumin/Cr ratio of 6  Lab Results  Component Value  Date   WBC 5.7 02/13/2019   HGB 12.5 (L) 02/13/2019   HCT 35.4 (L) 02/13/2019   MCV 98 (H) 02/13/2019   PLT 216 02/13/2019     Assessment and Plan:  Medicare annual wellness visit, subsequent   Essential hypertension, benign - elevated systolics per home report, low diastolic. Cont current regimen, due for f/u with Dr. Acie Fredrickson next month  Hemifacial spasm - improved; will f/u with neuro when symptoms recur/worsen for add'l treatments  Type 2 diabetes mellitus without complication, without long-term current use of insulin ([place]) - diet controlled (even though he isn't careful with diet)/stable. Encouraged low sugar/carb diet and regular exercise, weight loss  Pure hypercholesterolemia - lipids at goal   Screen for colon cancer - Plan: Fecal occult blood, imunochemical([facility])   PSA no longer recommended due to age; recommended at least 30 minutes of aerobic activity at least 5 days/week, weight-bearing exercise at least 2x/week; proper sunscreen use reviewed; healthy diet and alcohol recommendations (less than or equal to 2 drinks/day) reviewed; regular seatbelt use; changing batteries in smoke detectors.  Immunization recommendations discussed--continue yearly flu shots. Shingrix recommended, risks/SE reviewed, encouraged to get from pharmacy. Colonoscopy recommendations reviewed; technically due (5 years from adenomatous polyp), but due to age and lack of sx, check FIT testing.  If anemia or fecal occult blood, will refer back to GI. Encouraged him to resume home exercises for balance, discussed fall risks. Discussed BP goals in detail with pt and son (who was concerned about low diastolic); systolic is mildly elevated, diastolic is low. CPM, f/u with Dr. Acie Fredrickson.   Reviewed that he is Full Code, Full Care. Doesn't want prolonged measures (unable to sign MOST form due to virtual nature of visit).  F/u 6 mos Needs to schedule yearly with Dr. Acie Fredrickson     Follow Up  Instructions:    I discussed the assessment and  treatment plan with the patient. The patient was provided an opportunity to ask questions and all were answered. The patient agreed with the plan and demonstrated an understanding of the instructions.   The patient was advised to call back or seek an in-person evaluation if the symptoms worsen or if the condition fails to improve as anticipated.  I provided 48 minutes of non-face-to-face time during this encounter.   Vikki Ports, MD

## 2019-02-15 ENCOUNTER — Ambulatory Visit: Payer: Medicare Other | Admitting: Sports Medicine

## 2019-02-15 ENCOUNTER — Encounter: Payer: Self-pay | Admitting: Family Medicine

## 2019-02-15 ENCOUNTER — Other Ambulatory Visit: Payer: Self-pay

## 2019-02-15 ENCOUNTER — Ambulatory Visit (INDEPENDENT_AMBULATORY_CARE_PROVIDER_SITE_OTHER): Payer: Medicare Other | Admitting: Family Medicine

## 2019-02-15 VITALS — BP 150/61 | HR 64 | Ht 67.0 in | Wt 188.0 lb

## 2019-02-15 DIAGNOSIS — Z0000 Encounter for general adult medical examination without abnormal findings: Secondary | ICD-10-CM

## 2019-02-15 DIAGNOSIS — E119 Type 2 diabetes mellitus without complications: Secondary | ICD-10-CM

## 2019-02-15 DIAGNOSIS — G5139 Clonic hemifacial spasm, unspecified: Secondary | ICD-10-CM | POA: Diagnosis not present

## 2019-02-15 DIAGNOSIS — E7800 Pure hypercholesterolemia, unspecified: Secondary | ICD-10-CM

## 2019-02-15 DIAGNOSIS — Z1211 Encounter for screening for malignant neoplasm of colon: Secondary | ICD-10-CM

## 2019-02-15 DIAGNOSIS — I10 Essential (primary) hypertension: Secondary | ICD-10-CM | POA: Diagnosis not present

## 2019-02-23 ENCOUNTER — Other Ambulatory Visit: Payer: Self-pay | Admitting: Family Medicine

## 2019-02-23 DIAGNOSIS — I10 Essential (primary) hypertension: Secondary | ICD-10-CM

## 2019-02-27 ENCOUNTER — Telehealth: Payer: Self-pay | Admitting: Family Medicine

## 2019-02-27 NOTE — Telephone Encounter (Signed)
Requested records received from Dr. Marica Otter.

## 2019-02-28 ENCOUNTER — Encounter: Payer: Self-pay | Admitting: *Deleted

## 2019-03-05 DIAGNOSIS — Z1211 Encounter for screening for malignant neoplasm of colon: Secondary | ICD-10-CM | POA: Diagnosis not present

## 2019-03-14 DIAGNOSIS — L820 Inflamed seborrheic keratosis: Secondary | ICD-10-CM | POA: Diagnosis not present

## 2019-03-14 DIAGNOSIS — L570 Actinic keratosis: Secondary | ICD-10-CM | POA: Diagnosis not present

## 2019-03-14 DIAGNOSIS — Z85828 Personal history of other malignant neoplasm of skin: Secondary | ICD-10-CM | POA: Diagnosis not present

## 2019-03-15 LAB — FECAL OCCULT BLOOD, IMMUNOCHEMICAL: Fecal Occult Bld: NEGATIVE

## 2019-03-19 ENCOUNTER — Other Ambulatory Visit: Payer: Self-pay

## 2019-03-19 DIAGNOSIS — Z20822 Contact with and (suspected) exposure to covid-19: Secondary | ICD-10-CM

## 2019-03-20 LAB — NOVEL CORONAVIRUS, NAA: SARS-CoV-2, NAA: NOT DETECTED

## 2019-04-05 ENCOUNTER — Other Ambulatory Visit: Payer: Self-pay | Admitting: Cardiovascular Disease

## 2019-04-23 ENCOUNTER — Ambulatory Visit (INDEPENDENT_AMBULATORY_CARE_PROVIDER_SITE_OTHER): Payer: Medicare Other | Admitting: Cardiovascular Disease

## 2019-04-23 ENCOUNTER — Other Ambulatory Visit: Payer: Self-pay

## 2019-04-23 ENCOUNTER — Encounter: Payer: Self-pay | Admitting: Cardiovascular Disease

## 2019-04-23 VITALS — BP 150/68 | HR 58 | Ht 67.0 in | Wt 189.8 lb

## 2019-04-23 DIAGNOSIS — I10 Essential (primary) hypertension: Secondary | ICD-10-CM | POA: Diagnosis not present

## 2019-04-23 DIAGNOSIS — E782 Mixed hyperlipidemia: Secondary | ICD-10-CM

## 2019-04-23 LAB — BASIC METABOLIC PANEL
BUN/Creatinine Ratio: 16 (ref 10–24)
BUN: 19 mg/dL (ref 8–27)
CO2: 24 mmol/L (ref 20–29)
Calcium: 8.7 mg/dL (ref 8.6–10.2)
Chloride: 102 mmol/L (ref 96–106)
Creatinine, Ser: 1.17 mg/dL (ref 0.76–1.27)
GFR calc Af Amer: 64 mL/min/{1.73_m2} (ref >59)
GFR calc non Af Amer: 56 mL/min/{1.73_m2} — ABNORMAL LOW (ref >59)
Glucose: 137 mg/dL — ABNORMAL HIGH (ref 65–99)
Potassium: 4.7 mmol/L (ref 3.5–5.2)
Sodium: 140 mmol/L (ref 134–144)

## 2019-04-23 LAB — LIPID PANEL
Chol/HDL Ratio: 2.7 ratio (ref 0.0–5.0)
Cholesterol, Total: 138 mg/dL (ref 100–199)
HDL: 51 mg/dL (ref >39)
LDL Chol Calc (NIH): 71 mg/dL (ref 0–99)
Triglycerides: 84 mg/dL (ref 0–149)
VLDL Cholesterol Cal: 16 mg/dL (ref 5–40)

## 2019-04-23 LAB — HEPATIC FUNCTION PANEL
ALT: 13 IU/L (ref 0–44)
AST: 20 IU/L (ref 0–40)
Albumin: 4.2 g/dL (ref 3.6–4.6)
Alkaline Phosphatase: 58 IU/L (ref 39–117)
Bilirubin Total: 0.7 mg/dL (ref 0.0–1.2)
Bilirubin, Direct: 0.21 mg/dL (ref 0.00–0.40)
Total Protein: 6 g/dL (ref 6.0–8.5)

## 2019-04-23 NOTE — Patient Instructions (Signed)
Medication Instructions:  Your physician recommends that you continue on your current medications as directed. Please refer to the Current Medication list given to you today.  If you need a refill on your cardiac medications before your next appointment, please call your pharmacy.   Lab work: TODAY - cholesterol, liver panel, basic metabolic panel  If you have labs (blood work) drawn today and your tests are completely normal, you will receive your results only by: Marland Kitchen MyChart Message (if you have MyChart) OR . A paper copy in the mail If you have any lab test that is abnormal or we need to change your treatment, we will call you to review the results.   Testing/Procedures: None Ordered   Follow-Up: At [facility], you and your health needs are our priority.  As part of our continuing mission to provide you with exceptional heart care, we have created designated Provider Care Teams.  These Care Teams include your primary Cardiologist (physician) and Advanced Practice Providers (APPs -  Physician Assistants and Nurse Practitioners) who all work together to provide you with the care you need, when you need it. You will need a follow up appointment in:  1 years.  Please call our office 2 months in advance to schedule this appointment.  You may see Dr. Acie Fredrickson or one of the following Advanced Practice Providers on your designated Care Team: Richardson Dopp, PA-C [place], [place] . Daune Perch, NP

## 2019-04-23 NOTE — Progress Notes (Signed)
Peter [name] Date of Birth  1930-01-31 [facility] / [facility] [id number]     [place], [place]  [address] [phone]  Fax  [phone]   Problems 1. Htn 2. DM 3. Hyperlipidemia 4. Elevated coronary calcium 5.  Mild carotid artery disease ( 40-60 % bilaterally)    85 yo patient of Dr. Verlon Setting.  Hx of diabetes, HTN, dyslipidemia, and elevated coronary calcium score.  Also has mild-moderate carotid disease.  Is active but he doesn't walk regularly.  Has a cabin in [place].  No chest pain or dyspnea with his usual activities.    Oct. 3, 2013 -  He has not had any cardiac problems - no chest pain.  He stays avtive but does not walk regularly.  He describes some symptoms c/w claudication but has great pulses in his feet.  His systolic BP has been a bit elevated.  November 07, 2012:  Peter [name] is doing well.  He has been going up to his cabin in [place] periodically.    He measures his BP regularly - some times his systolic readings are a bit high.  He wants to start walking regularly.    He does have some chest tightness when he walks.  He has had stress nuclear tests with Dr Verlon Setting which were negative.  He also has a hiatal hernia and esophageal spasm ( according to radiologist)  .  Aug 13 2013:  Peter [name] has done well.  He still has some mild chest discomfort with exertion - especially after he has eaten.    The pain is relieved with Tums.   He has known esophageal spasm. The pain is a  Mild tightness.  He had a myoview study 8 years ago ( Dr. Verlon Setting) which was negative.  He is not inclined to repeat the [facility] unless it is absolutely necessary.   He still gets up to his [place] house in [place].    He is able to work up there for hours without CP or dyspnea.    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  Sept. 9, 2015:  Still active and able to do all of his normal activities. He had a  lifeline screening done.  He has very mild carotid plaque.  We have known about this mild plaque. He also has some symptoms of orthostasis.   February 12, 2016:  Felling well. No CP or dyspnea. Has had a cough since April . Is trying lots of breathing meds.  Is fatigued. Has orthostasis on occasion .  Weakness  in his legs .  Aug. 22, 2018   Peter [name] is seen today .  Has had some eye surgery  No CP Still gets fatigued. ,   Walking up driveway with garbage cans causes fatigue  Overall seems to be feeling fairly well.  April 04, 2018:  Peter [name] is seen today for follow-up visit.  He seems to be doing well. More fatigue No CP ,     Sept. 14, 2020 Peter [name] is seen today for follow-up of his hypertension, hyperlipidemia, mild coronary artery calcifications and mild to moderate carotid artery disease. Gets fatigued with exercise.   Walks his dog.   Active.   Still eating salty pretzles  BP is variable   Current Outpatient Medications on File Prior to Visit  Medication Sig Dispense Refill  . ANORO ELLIPTA 62.5-25 MCG/INH AEPB INHALE  1 PUFF ONCE DAILY. 60 each 3  . aspirin EC 81 MG tablet Take 81 mg by mouth daily.    Marland Kitchen atorvastatin (LIPITOR) 20 MG tablet Take 1 tablet (20 mg total) by mouth daily. Pt needs to keep upcoming appt for further refills 90 tablet 0  . carvedilol (COREG) 25 MG tablet TAKE 1 TABLET BY MOUTH TWICE DAILY. 180 tablet 0  . fluticasone (FLONASE) 50 MCG/ACT nasal spray Place 2 sprays into both nostrils daily. 32 g 5  . losartan (COZAAR) 50 MG tablet Take 1 tablet (50 mg total) by mouth daily. 90 tablet 3  . VENTOLIN HFA 108 (90 Base) MCG/ACT inhaler INHALE 2 PUFFS EVERY 6 HOURS AS NEEDED FOR WHEEZING OR SHORTNESS OF BREATH. 18 g 0   Current Facility-Administered Medications on File Prior to Visit  Medication Dose Route Frequency Provider Last Rate Last Dose  . incobotulinumtoxinA (XEOMIN) 50 units injection 50 Units  50 Units Intramuscular Q90 days [name], MD   50 Units  at 06/19/18 1115   he is not taking the norvasc currently.   Allergies  Allergen Reactions  . Iodine Other (See Comments)    Feels like he is "burning up"    Past Medical History:  Diagnosis Date  . Adenomatous colon polyp 07/2013  . Asthma h/o as a child-s/p immunotherapy  . Diabetes mellitus diet controlled  . 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 FHx: colon cancer   . Hiatal hernia   . Hypercholesteremia   . Hypertension age 55  . Internal hemorrhoids 07/2013   noted on colonoscopy  . Restlessness and agitation 08/13/2014    Past Surgical History:  Procedure Laterality Date  . APPENDECTOMY    . BLEPHAROPLASTY Bilateral 12/13/2016  . CARDIOVASCULAR STRESS TEST  normal  . CATARACT EXTRACTION, BILATERAL  07/2014, 08/2014  . COLONOSCOPY  3/08, 07/2013   no repeat rec due to age per [name]  . VASECTOMY      Social History   Tobacco Use  Smoking Status Former Smoker  . Quit date: 08/09/1974  . Years since quitting: 44.7  Smokeless Tobacco Never Used    Social History   Substance and Sexual Activity  Alcohol Use Yes  . Alcohol/week: 0.0 standard drinks   Comment: 1 daily    Family History  Problem Relation Age of Onset  . Alcohol abuse Father   . Cancer Sister        stomach/?pancreatic  . Colon cancer Brother 42  . Cancer Brother        colon cancer in mid 74's  . Arthritis Daughter        psoriatic  . Hyperlipidemia Son   . Arthritis Daughter        psoriatic  . Hyperlipidemia Son   . Diabetes Maternal Grandmother   . Cancer Sister        lung and brain  . Stroke Neg Hx   . Heart disease Neg Hx     Reviw of Systems:  Reviewed in the HPI.  All other systems are negative. Physical Exam: Blood pressure (!) 150/68, pulse (!) 58, height 5\' 7"  (1.702 m), weight 189 lb 12.8 oz (86.1 kg), SpO2 98 %.  GEN:  Elderly male,  NAD  HEENT: Normal NECK: No JVD; soft left carotid bruits LYMPHATICS: No lymphadenopathy CARDIAC: RRR , no murmurs, rubs,  gallops RESPIRATORY:  Clear to auscultation without rales, wheezing or rhonchi  ABDOMEN: Soft, non-tender, non-distended MUSCULOSKELETAL:  No edema; No deformity  SKIN:  Warm and dry NEUROLOGIC:  Alert and oriented x 3    ECG:     April 23, 2019: Sinus bradycardia at 58 beats minute.  Low voltage QRS.   Assessment / Plan:   1. Htn-     BP is variable .   Is typically normal during the day . Marland Kitchen  2. DM - plans per primary   3. Hyperlipidemia -continue atorvastatin 20 mg a day.  Will check labs today.  4. Elevated coronary calcium -   No angina .   No further work up planned.   5.  Mild carotid artery disease ( 40-60 % bilaterally)- asymptomatic .   Continue  to follow        Mertie Moores, MD  04/23/2019 9:41 AM    [address],  [place], [place]  [phone] Pager [phone] Phone: [phone]; Fax: [phone]

## 2019-05-29 ENCOUNTER — Other Ambulatory Visit: Payer: Self-pay

## 2019-05-29 ENCOUNTER — Other Ambulatory Visit (INDEPENDENT_AMBULATORY_CARE_PROVIDER_SITE_OTHER): Payer: Medicare Other

## 2019-05-29 DIAGNOSIS — Z23 Encounter for immunization: Secondary | ICD-10-CM | POA: Diagnosis not present

## 2019-06-10 ENCOUNTER — Other Ambulatory Visit: Payer: Self-pay | Admitting: Cardiovascular Disease

## 2019-07-03 ENCOUNTER — Other Ambulatory Visit: Payer: Self-pay | Admitting: Cardiovascular Disease

## 2019-08-23 ENCOUNTER — Encounter: Payer: Self-pay | Admitting: Family Medicine

## 2019-09-03 ENCOUNTER — Telehealth: Payer: Self-pay | Admitting: *Deleted

## 2019-09-03 NOTE — Telephone Encounter (Signed)
I called patient to confirm appt for this Thurs-he wanted to make sure he needed to be in person and if so, did he need to be fasting.

## 2019-09-03 NOTE — Telephone Encounter (Signed)
Patient advised.

## 2019-09-03 NOTE — Telephone Encounter (Signed)
Yes, he needs to be in person, because his last visit was virtual. He does not need to fast (had lipids in September, can do A1c at his visit).

## 2019-09-05 NOTE — Progress Notes (Signed)
Chief Complaint  Patient presents with  . Diabetes    nonfasting med check. No concerns.    Patient presents for 6 month check.  He denies any complaints.  Diabetes: He has never taken medications for diabetes (he tried Peter [name] for a month or so many years ago with Peter [name]).Denies polydipsia, polyuria. He doesn't monitor his blood sugars. Last diabetic eye exam was within the year per pt. (I see where records were reportedly received from Peter [name] in 02/2019, but don't see scanned records or DM eye exam abstracted--possibly wasn't diabetic exam? Will look into).  Doesn't watch his diet, eats sweets, sugars haven't changed in many years. He reports checking his feet regularly, and denies numbness/tingling/pain or lesions/sores.  Lab Results  Component Value Date   HGBA1C 6.2 (H) 02/13/2019   He realizes he has gained weight, and is planning to start the [facility] clinic program for weight loss.  He has done this program multiple times in the past.  He hopes to lose 10# (eggs, salad, meat,lots of vegetables, and  grapefruit juice with each meal--states he will separate from meds by at least 3 hours).  Hehad been gettingEMG guided Xeomin injectionsfor right hemifacial spasm by Peter [name] with good results.  Last injection was 06/2018. Only has them prn, doing well.  Feels some twitching again in the right eye, but mild, not significant enough for another injection yet.  [place] Peter [name] in9/2020. He follows his lipids and blood pressure. He did not recommend any med changes.  H/o elevated coronary calcium - no angina. Gets fatigued walking up the driveway with trash can, unchanged.He denies any chest pain. He has reported a discomfort in the chest in the past, which was relieved byTums, and that is less often than in the past (related to hiatal hernia).  Gets reflux less than in the past, avoids the triggering foods.  He also has mild carotid artery disease (per Peter [name] note,  reportedly 40-60% bilaterally)  Denies any neurologic symptoms.  HTN-- BP's have been running 132-146/60's-70. He admits he hasn't been checking recently, recalls this is what they "always run". He continues on carvedilol 25mg  BID and losartan 50mg  daily. He denies headaches, dizziness.  BP Readings from Last 3 Encounters:  04/23/19 (!) 150/68  02/15/19 (!) 150/61  06/19/18 (!) 117/59   Hyperlipidemia follow-up: Compliant with medications (atorvastatin 20mg )  and denies medication side effects.  Lab Results  Component Value Date   CHOL 138 04/23/2019   HDL 51 04/23/2019   LDLCALC 71 04/23/2019   TRIG 84 04/23/2019   CHOLHDL 2.7 04/23/2019   COPD (and h/o asthma):  Using Anoro Ellipta off/on, admittedly infrequently.  He uses it when very active and around sawdust, and it is effective. He never needs to use the Albuterol.  Allergies:  He takes Flonase (most days, not every day), which helps a lot with his PND.  He uses Zyrtec seasonally only (Spring)  08/23/19 got first dose of COVID vaccine  Plans to get Shingrix, will wait until early March (4 weeks after getting 2nd COVID dose).   PMH, PSH, SH reviewed and updated  Outpatient Encounter Medications as of 09/06/2019  Medication Sig Note  . ANORO ELLIPTA 62.5-25 MCG/INH AEPB INHALE 1 PUFF ONCE DAILY. 09/06/2019: Off and on  . atorvastatin (LIPITOR) 20 MG tablet TAKE 1 TABLET ONCE DAILY.   . carvedilol (COREG) 25 MG tablet TAKE 1 TABLET BY MOUTH TWICE DAILY. 09/06/2019: Only been taking once daily for the last  week because he ran low when out at the cabin; back to BID since just came home.  . fluticasone (FLONASE) 50 MCG/ACT nasal spray Place 2 sprays into both nostrils daily.   Marland Kitchen losartan (COZAAR) 50 MG tablet TAKE 1 TABLET ONCE DAILY.   Marland Kitchen aspirin EC 81 MG tablet Take 81 mg by mouth daily. 09/06/2019: Ran out last week  . VENTOLIN HFA 108 (90 Base) MCG/ACT inhaler INHALE 2 PUFFS EVERY 6 HOURS AS NEEDED FOR WHEEZING OR SHORTNESS OF  BREATH. (Patient not taking: Reported on 09/06/2019)    Facility-Administered Encounter Medications as of 09/06/2019  Medication  . incobotulinumtoxinA (XEOMIN) 50 units injection 50 Units   Allergies  Allergen Reactions  . Iodine Other (See Comments)    Feels like he is "burning up"    ROS:  No fever, chills, URI symptoms, cough, shortness of breath. No headaches, dizziness, chest pain (just related to HH/GERD, infrequent).  Denies edema, claudication.No bowel changes or urinary complaints (up 2-3x/night, unchanged). Some decrease in energy with aging. Denies bleeding, bruising, rash.  Moods are good. Allergies and breathing are controlled currently    PHYSICAL EXAM:  BP (!) 150/70   Pulse 72   Temp (!) 96.9 F (36.1 C) (Other (Comment))   Ht 5\' 7"  (1.702 m)   Wt 192 lb 9.6 oz (87.4 kg)   BMI 30.17 kg/m   138/58 on repeat by MD  Wt Readings from Last 3 Encounters:  09/06/19 192 lb 9.6 oz (87.4 kg)  04/23/19 189 lb 12.8 oz (86.1 kg)  02/15/19 188 lb (85.3 kg)   Pleasant, elderly male in good spirits. HEENT: conjunctiva and sclera are clear, EOMI. Wearing mask due to COVID-19 pandemic. Switching noted in R eye, mainly when talking, smiling; less so when face is at rest. Neck; no lymphadenopathy, thyromegaly or carotid bruit Heart: Regular rate and rhythm Lungs: clear bilaterally, no wheezing Abdomen: soft, nontender, no organomegaly or mass Extremities: no edema, 2+ pulses. Normal sensation to monofilament. Dry skin Neuro: alert and oriented, normal gait. Twitching of R eye Psych: normal mood, affect, hygiene and grooming  Lab Results  Component Value Date   HGBA1C 6.4 (A) 09/06/2019     ASSESSMENT/PLAN:  Pure hypercholesterolemia - lipids at goal, cont atorvastatin  Controlled type 2 diabetes mellitus with complication, without long-term current use of insulin (HCC) - diet controlled, but A1c increasing. Encouraged cutting back on sweets, exercise and wt  loss - Plan: HgB A1c  Bilateral carotid artery disease, unspecified type ([place]) - no need to repeat imaging, given asymptomatic, and on statin and ASA  Chronic obstructive pulmonary disease, unspecified COPD type ([place]) - stable  Hypertension associated with diabetes ([place])  Hyperlipidemia associated with type 2 diabetes mellitus ([place])  Essential hypertension, benign - borderline control. To cont monitor at home.  Has very high salt diet--encouraged cutting back, exercise, wt loss - Plan: carvedilol (COREG) 25 MG tablet  BMI 30.0-30.9,adult  Class 1 obesity due to excess calories with serious comorbidity and body mass index (BMI) of 30.0 to 30.9 in adult - counseled re: diet, exercise, wt loss  Gastroesophageal reflux disease without esophagitis - counseled re: diet, meds. May try pepcid [facility] prior to meal known to trigger reflux

## 2019-09-06 ENCOUNTER — Encounter: Payer: Self-pay | Admitting: Family Medicine

## 2019-09-06 ENCOUNTER — Other Ambulatory Visit: Payer: Self-pay

## 2019-09-06 ENCOUNTER — Ambulatory Visit (INDEPENDENT_AMBULATORY_CARE_PROVIDER_SITE_OTHER): Payer: Medicare Other | Admitting: Family Medicine

## 2019-09-06 VITALS — BP 138/58 | HR 72 | Temp 96.9°F | Ht 67.0 in | Wt 192.6 lb

## 2019-09-06 DIAGNOSIS — E1169 Type 2 diabetes mellitus with other specified complication: Secondary | ICD-10-CM

## 2019-09-06 DIAGNOSIS — I10 Essential (primary) hypertension: Secondary | ICD-10-CM

## 2019-09-06 DIAGNOSIS — E6609 Other obesity due to excess calories: Secondary | ICD-10-CM

## 2019-09-06 DIAGNOSIS — E7800 Pure hypercholesterolemia, unspecified: Secondary | ICD-10-CM

## 2019-09-06 DIAGNOSIS — E118 Type 2 diabetes mellitus with unspecified complications: Secondary | ICD-10-CM | POA: Diagnosis not present

## 2019-09-06 DIAGNOSIS — I779 Disorder of arteries and arterioles, unspecified: Secondary | ICD-10-CM | POA: Diagnosis not present

## 2019-09-06 DIAGNOSIS — E785 Hyperlipidemia, unspecified: Secondary | ICD-10-CM

## 2019-09-06 DIAGNOSIS — I152 Hypertension secondary to endocrine disorders: Secondary | ICD-10-CM

## 2019-09-06 DIAGNOSIS — E66811 Obesity, class 1: Secondary | ICD-10-CM

## 2019-09-06 DIAGNOSIS — Z6830 Body mass index (BMI) 30.0-30.9, adult: Secondary | ICD-10-CM

## 2019-09-06 DIAGNOSIS — E1159 Type 2 diabetes mellitus with other circulatory complications: Secondary | ICD-10-CM

## 2019-09-06 DIAGNOSIS — K219 Gastro-esophageal reflux disease without esophagitis: Secondary | ICD-10-CM

## 2019-09-06 DIAGNOSIS — J449 Chronic obstructive pulmonary disease, unspecified: Secondary | ICD-10-CM

## 2019-09-06 LAB — POCT GLYCOSYLATED HEMOGLOBIN (HGB A1C): Hemoglobin A1C: 6.4 % — AB (ref 4.0–5.6)

## 2019-09-06 MED ORDER — CARVEDILOL 25 MG PO TABS: 25.0000 mg | ORAL_TABLET | Freq: Two times a day (BID) | ORAL | 1 refills | Status: DC

## 2019-09-06 NOTE — Patient Instructions (Addendum)
Be sure to schedule your diabetic eye exam yearly (and request that they send [place] a copy of the report).  You can consider taking a Pepcid AC or prilosec prior to or with a meal that you KNOW will trigger a lot of reflux symptoms (rather than waiting to feel bad and take Tums).  Check your blood pressure a couple of times per week over the next week weeks.  If it is consistently in the 140's or higher, let Dr. Acie Fredrickson know. Goal blood pressure is under 130/80.  You likely can reduce your blood pressure without changing medications, by cutting back some of the salt in your diet, and getting regular exercise.

## 2019-09-10 NOTE — Progress Notes (Signed)
Called Dr. Ammie Ferrier office and his last OV was in 2018 (which we have abstracted already), I called him and he said he will schedule with them. I reminded him to ask for a diabetic eye exam. I was to call him anyway to ask about the COVID vaccine-he wasn't exactly sure which one he received, which is why I didn't abstract. He was going to go home and check. I had a note to call him-it was Moderna and I did abstract.

## 2019-09-14 DIAGNOSIS — L853 Xerosis cutis: Secondary | ICD-10-CM | POA: Diagnosis not present

## 2019-09-14 DIAGNOSIS — L821 Other seborrheic keratosis: Secondary | ICD-10-CM | POA: Diagnosis not present

## 2019-09-14 DIAGNOSIS — Z85828 Personal history of other malignant neoplasm of skin: Secondary | ICD-10-CM | POA: Diagnosis not present

## 2019-09-14 DIAGNOSIS — D0461 Carcinoma in situ of skin of right upper limb, including shoulder: Secondary | ICD-10-CM | POA: Diagnosis not present

## 2019-09-14 DIAGNOSIS — L814 Other melanin hyperpigmentation: Secondary | ICD-10-CM | POA: Diagnosis not present

## 2019-09-18 DIAGNOSIS — D0461 Carcinoma in situ of skin of right upper limb, including shoulder: Secondary | ICD-10-CM

## 2019-09-18 HISTORY — DX: Carcinoma in situ of skin of right upper limb, including shoulder: D04.61

## 2019-09-20 ENCOUNTER — Telehealth: Payer: Self-pay | Admitting: Family Medicine

## 2019-09-20 ENCOUNTER — Encounter: Payer: Self-pay | Admitting: *Deleted

## 2019-09-20 DIAGNOSIS — N529 Male erectile dysfunction, unspecified: Secondary | ICD-10-CM

## 2019-09-20 MED ORDER — SILDENAFIL CITRATE 100 MG PO TABS: 50.0000 mg | ORAL_TABLET | Freq: Every day | ORAL | 1 refills | Status: DC | PRN

## 2019-09-20 NOTE — Telephone Encounter (Signed)
Pt called for refills of Viagra. He states he has not had it filled in a while. He is requesting a refill be sent to [facility] on [address]. Pt can be reached at [phone].

## 2019-09-26 ENCOUNTER — Encounter: Payer: Self-pay | Admitting: Family Medicine

## 2019-10-02 ENCOUNTER — Telehealth: Payer: Self-pay

## 2019-10-02 NOTE — Telephone Encounter (Signed)
I called and checked status of the patients benefits. We checked coverage for codes [phone] and [phone], the representative stated the codes are covered and billable with [place] ref#Vone O. 10/02/19. DWD

## 2019-10-03 ENCOUNTER — Other Ambulatory Visit: Payer: Self-pay

## 2019-10-03 ENCOUNTER — Ambulatory Visit: Payer: Medicare Other | Admitting: Neurology

## 2019-10-03 ENCOUNTER — Encounter: Payer: Self-pay | Admitting: Neurology

## 2019-10-03 VITALS — BP 127/58 | HR 64 | Temp 97.6°F | Ht 67.0 in | Wt 194.0 lb

## 2019-10-03 DIAGNOSIS — G5131 Clonic hemifacial spasm, right: Secondary | ICD-10-CM | POA: Diagnosis not present

## 2019-10-03 MED ORDER — INCOBOTULINUMTOXINA 50 UNITS IM SOLR
50.0000 [IU] | INTRAMUSCULAR | Status: DC
  Administered 2019-10-03: 50 [IU] via INTRAMUSCULAR

## 2019-10-03 NOTE — Progress Notes (Signed)
PATIENT: Peter [name] DOB: 14-Aug-1929  Chief Complaint  Patient presents with  . Hemifacial Spasm    Xeomin 50 units x 1 vial - office supply     HISTORICAL  Peter [name] is 86 years old right-handed male, accompanied by his wife, seen in refer by Dr. Brett Fairy for evaluation of EMG guided botulism toxin injection for right hemifacial spasm  I reviewed and summarized his medical history, he had a history of hypertension hyperlipidemia, bilateral cataract surgery, around 2015, he noticed right facial muscle spasm, initially mainly involving muscle around his right eye, over the past few years, gradually getting worse, more frequent, occasionally blocking his right vision, also spreading to right cheek, right upper mouth corner, is present 25% daytime, he also has bilateral senior ptosis, occasionally blocking upper vision, was suggested bilateral blepharoplasty by his ophthalmologist.   He denies a history of right facial palsy, no visual loss, no hearing loss,  He complains of chronic cough for few months, He also complains of chronic low back pain, knee pain, mild gait abnormality.  Update April 01 2016:  We have personally reviewed MRI of brain in July 2017, there is evidence of generalized atrophy, supratentorium small vessel disease, no acute abnormalities.  We reviewed that he has vascular risk factor of aging, hypertension, hyperlipidemia, he should take baby aspirin daily.  Today is his first EMG guided Xeomin injection for right hemifacial spasm, all the questions were answered, potential side effect was explained  UPDATE Jul 07 2016: He responded very well to previous EMG guided Xeomin injection in August 2017 for right hemifacial spasm, the same time he was evaluated by ophthalmologist, is planning on to have right blepharoplasty for redundant tissue at the right upper eyelid.  UPDATE March 09 2017: He had bilateral blepharoplasty, also tear duct  procedure, only recent few weeks, he noticed recurrent right eye, and cheek muscle twitching, last Xeomin injection was on July 07 7016.  UPDATE Jun 15 2017: He did very well with previous injection, there is no significant side effect noticed.  UPDATE Jun 19 2018: Last injection was in 2018, he did very well.  UPDATE Oct 03 2019: He responded very well to previous injection  REVIEW OF SYSTEMS: Full 14 system review of systems performed and notable only for as above ALLERGIES: Allergies  Allergen Reactions  . Iodine Other (See Comments)    Feels like he is "burning up"    HOME MEDICATIONS: Current Outpatient Medications  Medication Sig Dispense Refill  . ANORO ELLIPTA 62.5-25 MCG/INH AEPB INHALE 1 PUFF ONCE DAILY. 60 each 3  . aspirin EC 81 MG tablet Take 81 mg by mouth daily.    Marland Kitchen atorvastatin (LIPITOR) 20 MG tablet TAKE 1 TABLET ONCE DAILY. 90 tablet 0  . carvedilol (COREG) 25 MG tablet Take 1 tablet (25 mg total) by mouth 2 (two) times daily. 180 tablet 1  . fluticasone (FLONASE) 50 MCG/ACT nasal spray Place 2 sprays into both nostrils daily. 32 g 5  . incobotulinumtoxinA (XEOMIN) 50 units SOLR injection Inject 50 Units into the muscle every 3 (three) months.    Marland Kitchen losartan (COZAAR) 50 MG tablet TAKE 1 TABLET ONCE DAILY. 90 tablet 2  . sildenafil (VIAGRA) 100 MG tablet Take 0.5-1 tablets (50-100 mg total) by mouth daily as needed for erectile dysfunction. 30 tablet 1  . VENTOLIN HFA 108 (90 Base) MCG/ACT inhaler INHALE 2 PUFFS EVERY 6 HOURS AS NEEDED FOR WHEEZING OR SHORTNESS OF BREATH. 18 g  0   No current facility-administered medications for this visit.    PAST MEDICAL HISTORY: Past Medical History:  Diagnosis Date  . Adenomatous colon polyp 07/2013  . Asthma h/o as a child-s/p immunotherapy  . Diabetes mellitus diet controlled  . 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 FHx: colon cancer   . Hiatal hernia   . Hypercholesteremia   . Hypertension age 62  . Internal  hemorrhoids 07/2013   noted on colonoscopy  . Restlessness and agitation 08/13/2014  . 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 APPENDECTOMY    . BLEPHAROPLASTY Bilateral 12/13/2016  . CARDIOVASCULAR STRESS TEST  normal  . CATARACT EXTRACTION, BILATERAL  07/2014, 08/2014  . COLONOSCOPY  3/08, 07/2013   no repeat rec due to age per [name]  . VASECTOMY      FAMILY HISTORY: Family History  Problem Relation Age of Onset  . Alcohol abuse Father   . Cancer Sister        stomach/?pancreatic  . Colon cancer Brother 78  . Cancer Brother        colon cancer in mid 39's  . Arthritis Daughter        psoriatic  . Hyperlipidemia Son   . Arthritis Daughter        psoriatic  . Hyperlipidemia Son   . Diabetes Maternal Grandmother   . Cancer Sister        lung and brain  . Stroke Neg Hx   . Heart disease Neg Hx     SOCIAL HISTORY:  Social History   Socioeconomic History  . Marital status: Married    Spouse name: Not on file  . Number of children: 8  . Years of education: Not on file  . Highest education level: Not on file  Occupational History  . 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 Smoking status: Former Smoker    Quit date: 08/09/1974    Years since quitting: 45.1  . Smokeless tobacco: Never Used  Substance and Sexual Activity  . Alcohol use: Yes    Alcohol/week: 0.0 standard drinks    Comment: 1 daily  . Drug use: No  . Sexual activity: Yes    Partners: Female  Other Topics Concern  . Not on file  Social History Narrative   Lives with wife, dog.  Children live in [place], [place], [place], [place], and 2 daughters here in [place], [place], [place]. 16 grandchildren, 8-9 great-grandchildren   Patient is right handed.  And drinks caffeine daily.   Social Determinants of Health   Financial Resource Strain:   . Difficulty of  Paying Living Expenses: Not on file  Food Insecurity:   . Worried About Charity fundraiser in the Last Year: Not on file  . Ran Out of Food in the Last Year: Not on file  Transportation Needs:   . Lack of Transportation (Medical): Not on file  . Lack of Transportation (Non-Medical): Not on file  Physical Activity:   . Days of Exercise per Week: Not on file  . Minutes of Exercise per Session: Not on file  Stress:   . Feeling of Stress : Not on file  Social Connections:   . Frequency of Communication with Friends and Family: Not on file  . Frequency of Social Gatherings with Friends and Family: Not on file  .  Attends Religious Services: Not on file  . Active Member of Clubs or Organizations: Not on file  . Attends Archivist Meetings: Not on file  . Marital Status: Not on file  Intimate Partner Violence:   . Fear of Current or Ex-Partner: Not on file  . Emotionally Abused: Not on file  . Physically Abused: Not on file  . Sexually Abused: Not on file     PHYSICAL EXAM   Vitals:   10/03/19 1352  BP: (!) 127/58  Pulse: 64  Temp: 97.6 F (36.4 C)  Weight: 194 lb (88 kg)  Height: 5\' 7"  (1.702 m)    Not recorded      Body mass index is 30.38 kg/m.  PHYSICAL EXAMNIATION:   CN VII: He rarely has right hemifacial muscle spasm, mainly involving right orbicularis oculi, occasional involvement of right cheek, upper mouth area   DIAGNOSTIC DATA (LABS, IMAGING, TESTING) - I reviewed patient records, labs, notes, testing and imaging myself where available.   ASSESSMENT AND PLAN  Peter [name] is a 86 y.o. male   Right hemifacial spasm  EMG guided xeomin injection, 50 units unit, used 17.5 units, discard 32.5units  Right orbicularis oculi at  4, 6, 8, 9, 10 clock, 2.5 x5 units each= 12.5 units Left orbicularis oculi at 3, 4:00, 2.5 units each= 5 units     [name], M.D. Ph.D.  [facility] [address], [place], [place]  [address] Ph: [phone] Fax: [phone]  CC: Rita Ohara, MD

## 2019-10-03 NOTE — Progress Notes (Signed)
**  Xeomin 50 units x 1 vial, NDC 0259-1605-01, Lot [id number], Exp 07/2021, office supply.//mck,rn** 

## 2019-11-21 ENCOUNTER — Other Ambulatory Visit: Payer: Self-pay | Admitting: Family Medicine

## 2019-11-21 DIAGNOSIS — J441 Chronic obstructive pulmonary disease with (acute) exacerbation: Secondary | ICD-10-CM

## 2019-11-21 NOTE — Telephone Encounter (Signed)
Is this okay to refill? 

## 2019-11-22 ENCOUNTER — Encounter: Payer: Self-pay | Admitting: *Deleted

## 2019-11-27 ENCOUNTER — Other Ambulatory Visit: Payer: Self-pay | Admitting: Cardiovascular Disease

## 2019-11-28 DIAGNOSIS — Z0120 Encounter for dental examination and cleaning without abnormal findings: Secondary | ICD-10-CM | POA: Diagnosis not present

## 2019-12-17 ENCOUNTER — Telehealth: Payer: Self-pay | Admitting: Neurology

## 2019-12-17 NOTE — Telephone Encounter (Signed)
Patient has a Botox appointment on 01/02/20. I called [employer] and I spoke with Santiago Glad. She states that [id number] and [id number] are valid and billable and do not require PA. Also eligible for B/B. Ref [id number].

## 2020-01-02 ENCOUNTER — Ambulatory Visit: Payer: Medicare Other | Admitting: Neurology

## 2020-01-30 DIAGNOSIS — H524 Presbyopia: Secondary | ICD-10-CM | POA: Diagnosis not present

## 2020-01-30 LAB — HM DIABETES EYE EXAM

## 2020-02-18 ENCOUNTER — Other Ambulatory Visit: Payer: Self-pay | Admitting: Family Medicine

## 2020-02-18 DIAGNOSIS — I10 Essential (primary) hypertension: Secondary | ICD-10-CM

## 2020-03-13 DIAGNOSIS — Z85828 Personal history of other malignant neoplasm of skin: Secondary | ICD-10-CM | POA: Diagnosis not present

## 2020-03-13 DIAGNOSIS — L821 Other seborrheic keratosis: Secondary | ICD-10-CM | POA: Diagnosis not present

## 2020-03-13 DIAGNOSIS — L570 Actinic keratosis: Secondary | ICD-10-CM | POA: Diagnosis not present

## 2020-03-13 DIAGNOSIS — D225 Melanocytic nevi of trunk: Secondary | ICD-10-CM | POA: Diagnosis not present

## 2020-03-23 NOTE — Patient Instructions (Addendum)
  HEALTH MAINTENANCE RECOMMENDATIONS:  It is recommended that you get at least 30 minutes of aerobic exercise at least 5 days/week (for weight loss, you may need as much as 60-90 minutes). This can be any activity that gets your heart rate up. This can be divided in 10-15 minute intervals if needed, but try and build up your endurance at least once a week.  Weight bearing exercise is also recommended twice weekly.  Eat a healthy diet with lots of vegetables, fruits and fiber.  "Colorful" foods have a lot of vitamins (ie green vegetables, tomatoes, red peppers, etc).  Limit sweet tea, regular sodas and alcoholic beverages, all of which has a lot of calories and sugar.  Up to 2 alcoholic drinks daily may be beneficial for men (unless trying to lose weight, watch sugars).  Drink a lot of water.  Sunscreen of at least SPF 30 should be used on all sun-exposed parts of the skin when outside between the hours of 10 am and 4 pm (not just when at beach or pool, but even with exercise, golf, tennis, and yard work!)  Use a sunscreen that says "broad spectrum" so it covers both UVA and UVB rays, and make sure to reapply every 1-2 hours.  Remember to change the batteries in your smoke detectors when changing your clock times in the spring and fall. Carbon monoxide detectors are recommended for your home.  Use your seat belt every time you are in a car, and please drive safely and not be distracted with cell phones and texting while driving.  Continue to limit your sweets, carbs and limit alcohol in order to continue to lose weight. Your diabetes continues to be controlled without medication.

## 2020-03-23 NOTE — Progress Notes (Signed)
Chief Complaint  Patient presents with  . Medicare Wellness    fasting CPE/med check. Thinks he left his COVID shot card at cabin, he will call me when he locates it. I ordered FIT and put order in envelope-all he has to do is, mail back to [place]. He said he remembers from last year. No concerns.     Peter [name] is a 86 y.o. male who presents for a complete physical.  (NOT doing Medicare Wellness today--that will be done in 6 months, as his insurance doesn't cover both being done on the same day).  He has the following concerns:  He reports feeling a little more fatigued--he'd like to walk more, but he is tired.  He also has some ongoing balance issues.  He takes the dog out a few times/day (and comes up the steep driveway) as his only exercise.  He denies falls.  Diabetes: He has never taken medications for diabetes (he tried Actos for a month or so many years ago with [name]).Denies polydipsia, polyuria. He doesn't monitor his blood sugars. Last diabetic eye examwas in 01/2020, no retinopathy. Doesn't watch his diet, eats sweets, sugars haven't changed in many years.  He still eats candy and dark chocolate, but reports that he has cut back on his carbs and breads recently, and has lost some weight. He reports checking his feet regularly, and denies numbness/tingling/pain or lesions/sores. Lab Results  Component Value Date   HGBA1C 6.4 (A) 09/06/2019   HegetsEMG guided Xeomin injectionsfor right hemifacial spasmby Dr. Krista Blue with good results.Only has them prn, doing well. Last was in 12/2019.   [place] Dr. Acie Fredrickson in9/2020. He follows his lipids and blood pressure. He did not recommend any med changes.  H/o elevated coronary calcium, no angina. Gets fatigued walking up the driveway with trash can, unchanged.He denies any chest pain. He has reported a discomfort in the chest in the past, which was relieved byTums, and that is less often than in the past (related to  hiatal hernia).  Gets reflux less than in the past, avoids the triggering foods.  He also has mild carotid artery disease (per Dr. Elmarie Shiley note, reportedly 40-60% bilaterally)  Denies any neurologic symptoms, other than his balance problems, which have remained stable. Some days are worse than others, and he attributes some of it to his eyes, and issues with his knees/feet.  HTN-- BP's have beenrunning 136-146/high 40's-70.  Pulse is always 60-70. He continues on carvedilol 25mg  BID and losartan 50mg  daily. He denies headaches, dizziness. BP Readings from Last 3 Encounters:  10/03/19 (!) 127/58  09/06/19 (!) 138/58  04/23/19 (!) 150/68   Hyperlipidemia follow-up: Compliant with medications (atorvastatin 20mg )  and denies medication side effects.  Lab Results  Component Value Date   CHOL 138 04/23/2019   HDL 51 04/23/2019   LDLCALC 71 04/23/2019   TRIG 84 04/23/2019   CHOLHDL 2.7 04/23/2019   COPD (and h/o asthma):  Using Anoro Ellipta off/on, admittedly infrequently.  He uses it when very active and around sawdust (at the cabin), and it is effective. He never uses albuterol.  Allergies:  These are well controlled currently.  He uses zyrtec seasonally (Spring), not currently.  He uses Flonase regularly.  Immunization History  Administered Date(s) Administered  . Fluad Quad(high Dose 65+) 05/29/2019  . Influenza Split 05/06/2011, 05/25/2012  . Influenza, High Dose Seasonal PF 05/21/2013, 05/22/2014, 06/25/2015, 04/15/2016, 05/04/2017, 07/19/2018  . Moderna SARS-COVID-2 Vaccination 08/23/2019  . Pneumococcal Conjugate-13 11/28/2013  .  Pneumococcal Polysaccharide-23 08/09/2006, 04/15/2016  . Tdap 05/06/2011  . Zoster 06/01/2012  . Zoster Recombinat (Shingrix) 11/21/2019, 03/11/2020  (he got the second COVID vaccine--will get [place] the date to enter) Last colonoscopy: 07/2013, [name], tubular adenoma; fecal occult blood negative last year Last PSA:2.4 in 12/2016 Dentist: twice  yearly  Ophtho:regularly Sees dermatologist twice yearly Exercise:walking the dog, 10 minutes 3/day.. Walks up and down the steep driveway during the day.  He has a living will and healthcare power of attorney  Fall Screen:negative Depression Screen:negative  Other doctors caring for patient include: Sports Med: [name] ENT: Dr. Willa Frater for hearing loss Neurology: Dr. Brett Fairy, Dr. Krista Blue (giving xeomin injections) Cardiology: Dr. Acie Fredrickson Ophtho: [name] Dermatology: Dr. Elvera Lennox GI: [name]  [facility], [facility], [facility] and [facility] were reviewed and updated  Outpatient Encounter Medications as of 03/24/2020  Medication Sig Note  . aspirin EC 81 MG tablet Take 81 mg by mouth daily.   Marland Kitchen atorvastatin (LIPITOR) 20 MG tablet TAKE 1 TABLET ONCE DAILY.   . carvedilol (COREG) 25 MG tablet TAKE 1 TABLET BY MOUTH TWICE DAILY. 03/24/2020: Occasionally misses the second dose (less often than in the past)  . fluticasone (FLONASE) 50 MCG/ACT nasal spray Place 2 sprays into both nostrils daily.   Marland Kitchen incobotulinumtoxinA (XEOMIN) 50 units SOLR injection Inject 50 Units into the muscle every 3 (three) months.   Marland Kitchen losartan (COZAAR) 50 MG tablet TAKE 1 TABLET ONCE DAILY.   Marland Kitchen ANORO ELLIPTA 62.5-25 MCG/INH AEPB INHALE 1 PUFF ONCE DAILY. (Patient not taking: Reported on 03/24/2020) 03/24/2020: Uses prn when at the cabin and working with the saw  . sildenafil (VIAGRA) 100 MG tablet Take 0.5-1 tablets (50-100 mg total) by mouth daily as needed for erectile dysfunction. (Patient not taking: Reported on 03/24/2020)   . VENTOLIN HFA 108 (90 Base) MCG/ACT inhaler INHALE 2 PUFFS EVERY 6 HOURS AS NEEDED FOR WHEEZING OR SHORTNESS OF BREATH. (Patient not taking: Reported on 03/24/2020)    Facility-Administered Encounter Medications as of 03/24/2020  Medication  . incobotulinumtoxinA (XEOMIN) 50 units injection 50 Units   Allergies  Allergen Reactions  . Iodine Other (See Comments)    Feels like he is "burning up"     ROS: The patient denies anorexia, fever, headaches, ear pain, exertional chest pain, palpitations, syncope, dyspnea on exertion, swelling, nausea, vomiting, diarrhea, constipation, abdominal pain, melena, hematochezia, hematuria, incontinence, weakened urine stream, dysuria, genital lesions, numbness, tingling, weakness, tremor, suspicious skin lesions, depression, anxiety, abnormal bleeding/bruising, or enlarged lymph nodes. No insomnia +stable hearing loss.  Some dizziness when he first stands up, not new, unchanged.  Gets up slower. Nocturia x 3, unchanged. Some indigestion after spicy foods and tomato sauce, relieved by Tums, infrequent; has known hiatal hernia. Sees dermatologist regularly (twice yearly) Hemifacial spasm improved with botox injections Fatigue--this has been persistent for a while, possibly a little worse Denies thyroid symptoms--no changes to hair/skin/bowels/moods, just the fatigue Intentional weight loss Breathing is good, hasn't needed to use inhaler in a long time.   PHYSICAL EXAM:  BP (!) 144/68   Pulse 60   Ht 5\' 7"  (1.702 m)   Wt 186 lb 3.2 oz (84.5 kg)   BMI 29.16 kg/m   Wt Readings from Last 3 Encounters:  03/24/20 186 lb 3.2 oz (84.5 kg)  10/03/19 194 lb (88 kg)  09/06/19 192 lb 9.6 oz (87.4 kg)    General Appearance:  Alert, cooperative, no distress, appears stated age.  No significant facial twitching noted (  only occasional wink)  Head:  Normocephalic, without obvious abnormality, atraumatic   Eyes:  PERRL, conjunctiva/corneas clear, EOM's intact, fundi benign   Ears:  Normal TM's and external ear canals.  Nose:  Not examined, wearing mask due to COVID-19 pandemic  Throat:  Not examined, wearing mask due to COVID-19 pandemic  Neck:  Supple, no lymphadenopathy; thyroid: no enlargement/ tenderness/nodules; no carotid bruit or JVD   Back:  Spine nontender, no curvature, ROM normal, no CVA tenderness.  Lungs:  Clear  to auscultation bilaterally without wheezes, rales or ronchi; respirations unlabored   Chest Wall:  No tenderness or deformity   Heart:  Regular rate and rhythm, S1 and S2 normal, no rub or gallop. 2/6 SEM noted at RUSB  Breast Exam:  No chest wall tenderness, masses or gynecomastia   Abdomen:  Soft, non-tender, nondistended, normoactive bowel sounds, no masses, no hepatosplenomegaly   Genitalia:  Normal male external genitalia without lesions. Testicles without masses. No inguinal hernias.   Rectal:  Normal sphincter tone, no masses or tenderness; guaiac negative stool. Prostate smooth, no nodules, not enlarged.   Extremities:  No clubbing, cyanosis or edema; normal monofilament exam.  Pulses:  2+ and symmetric all extremities   Skin:  Skin color, texture, turgor normal, no rashes or lesions.   Lymph nodes:  Cervical, supraclavicular, and axillary nodes normal   Neurologic:  CNII-XII intact, normal strength, sensation and gait; reflexes 1+ and symmetric throughout. Negative SLR   Psych:    Normal mood, affect, hygiene and grooming  Lab Results  Component Value Date   HGBA1C 6.4 (A) 03/24/2020   Normal diabetic foot exam   ASSESSMENT/PLAN:  Annual physical exam - Plan: Comprehensive metabolic panel, CBC with Differential/Platelet, TSH, Microalbumin / creatinine urine ratio  Controlled type 2 diabetes mellitus with complication, without long-term current use of insulin (HCC) - Diet controlled. Reviewed diet, exercise, further weight loss recommended - Plan: HgB A1c, TSH, Microalbumin / creatinine urine ratio  Hypertension associated with diabetes ([place])  Hyperlipidemia associated with type 2 diabetes mellitus ([place])  Essential hypertension, benign - Borderline BP's, but low diastolic and some orthostasis.  Continue current meds, low Na diet, exercise, wt loss - Plan: Comprehensive metabolic panel  Pure hypercholesterolemia - cont statin,  low cholesterol diet - Plan: Lipid panel  Bilateral carotid artery disease, unspecified type (HCC) - asymptomatic. Cont ASA, statin, BP control  Chronic obstructive pulmonary disease, unspecified COPD type ([place]) - asymptomatic, other than when exposed to sawdust, and doing well with prn use of meds only  Screen for colon cancer - Plan: Fecal occult blood, imunochemical([facility])  Medication monitoring encounter - Plan: Comprehensive metabolic panel, CBC with Differential/Platelet, Microalbumin / creatinine urine ratio, Lipid panel  PSA not recommended based on age, recommended at least 30 minutes of aerobic activity at least 5 days/week, weight-bearing exercise at least 2x/week; proper sunscreen use reviewed; healthy diet and alcohol recommendations (less than or equal to 2 drinks/day) reviewed; regular seatbelt use; changing batteries in smoke detectors. Immunization recommendations discussed--continue yearly high dose flu shots. Tdap booster will be due next year. Colonoscopy recommendations reviewed; technically due (5 years from adenomatous polyp), but due to age and lack of sx, check yearly FIT testing.  If positive, or if anemia, refer back to GI.  F/u 6 months for AWV/med check, sooner prn. Forwarding labs to Dr. Acie Fredrickson

## 2020-03-24 ENCOUNTER — Other Ambulatory Visit: Payer: Self-pay

## 2020-03-24 ENCOUNTER — Encounter: Payer: Self-pay | Admitting: Family Medicine

## 2020-03-24 ENCOUNTER — Ambulatory Visit (INDEPENDENT_AMBULATORY_CARE_PROVIDER_SITE_OTHER): Payer: Medicare Other | Admitting: Family Medicine

## 2020-03-24 VITALS — BP 144/68 | HR 60 | Ht 67.0 in | Wt 186.2 lb

## 2020-03-24 DIAGNOSIS — E1159 Type 2 diabetes mellitus with other circulatory complications: Secondary | ICD-10-CM

## 2020-03-24 DIAGNOSIS — E785 Hyperlipidemia, unspecified: Secondary | ICD-10-CM

## 2020-03-24 DIAGNOSIS — J449 Chronic obstructive pulmonary disease, unspecified: Secondary | ICD-10-CM

## 2020-03-24 DIAGNOSIS — I10 Essential (primary) hypertension: Secondary | ICD-10-CM

## 2020-03-24 DIAGNOSIS — Z5181 Encounter for therapeutic drug level monitoring: Secondary | ICD-10-CM | POA: Diagnosis not present

## 2020-03-24 DIAGNOSIS — I152 Hypertension secondary to endocrine disorders: Secondary | ICD-10-CM

## 2020-03-24 DIAGNOSIS — Z0000 Encounter for general adult medical examination without abnormal findings: Secondary | ICD-10-CM | POA: Diagnosis not present

## 2020-03-24 DIAGNOSIS — E1169 Type 2 diabetes mellitus with other specified complication: Secondary | ICD-10-CM | POA: Diagnosis not present

## 2020-03-24 DIAGNOSIS — Z1211 Encounter for screening for malignant neoplasm of colon: Secondary | ICD-10-CM

## 2020-03-24 DIAGNOSIS — E7800 Pure hypercholesterolemia, unspecified: Secondary | ICD-10-CM

## 2020-03-24 DIAGNOSIS — E118 Type 2 diabetes mellitus with unspecified complications: Secondary | ICD-10-CM | POA: Diagnosis not present

## 2020-03-24 DIAGNOSIS — I779 Disorder of arteries and arterioles, unspecified: Secondary | ICD-10-CM

## 2020-03-24 LAB — POCT GLYCOSYLATED HEMOGLOBIN (HGB A1C): Hemoglobin A1C: 6.4 % — AB (ref 4.0–5.6)

## 2020-03-25 LAB — COMPREHENSIVE METABOLIC PANEL
ALT: 16 IU/L (ref 0–44)
AST: 24 IU/L (ref 0–40)
Albumin/Globulin Ratio: 2.5 — ABNORMAL HIGH (ref 1.2–2.2)
Albumin: 4.9 g/dL — ABNORMAL HIGH (ref 3.6–4.6)
Alkaline Phosphatase: 74 IU/L (ref 48–121)
BUN/Creatinine Ratio: 20 (ref 10–24)
BUN: 25 mg/dL (ref 8–27)
Bilirubin Total: 0.8 mg/dL (ref 0.0–1.2)
CO2: 23 mmol/L (ref 20–29)
Calcium: 9.5 mg/dL (ref 8.6–10.2)
Chloride: 100 mmol/L (ref 96–106)
Creatinine, Ser: 1.27 mg/dL (ref 0.76–1.27)
GFR calc Af Amer: 58 mL/min/{1.73_m2} — ABNORMAL LOW (ref >59)
GFR calc non Af Amer: 50 mL/min/{1.73_m2} — ABNORMAL LOW (ref >59)
Globulin, Total: 2 g/dL (ref 1.5–4.5)
Glucose: 127 mg/dL — ABNORMAL HIGH (ref 65–99)
Potassium: 4.8 mmol/L (ref 3.5–5.2)
Sodium: 137 mmol/L (ref 134–144)
Total Protein: 6.9 g/dL (ref 6.0–8.5)

## 2020-03-25 LAB — CBC WITH DIFFERENTIAL/PLATELET
Basophils Absolute: 0.1 10*3/uL (ref 0.0–0.2)
Basos: 1 %
EOS (ABSOLUTE): 0.5 10*3/uL — ABNORMAL HIGH (ref 0.0–0.4)
Eos: 8 %
Hematocrit: 38.6 % (ref 37.5–51.0)
Hemoglobin: 13.5 g/dL (ref 13.0–17.7)
Immature Grans (Abs): 0 10*3/uL (ref 0.0–0.1)
Immature Granulocytes: 1 %
Lymphocytes Absolute: 1.2 10*3/uL (ref 0.7–3.1)
Lymphs: 17 %
MCH: 33.8 pg — ABNORMAL HIGH (ref 26.6–33.0)
MCHC: 35 g/dL (ref 31.5–35.7)
MCV: 97 fL (ref 79–97)
Monocytes Absolute: 1 10*3/uL — ABNORMAL HIGH (ref 0.1–0.9)
Monocytes: 15 %
Neutrophils Absolute: 3.9 10*3/uL (ref 1.4–7.0)
Neutrophils: 58 %
Platelets: 239 10*3/uL (ref 150–450)
RBC: 3.99 x10E6/uL — ABNORMAL LOW (ref 4.14–5.80)
RDW: 12.1 % (ref 11.6–15.4)
WBC: 6.7 10*3/uL (ref 3.4–10.8)

## 2020-03-25 LAB — TSH: TSH: 7.16 u[IU]/mL — ABNORMAL HIGH (ref 0.450–4.500)

## 2020-03-25 LAB — MICROALBUMIN / CREATININE URINE RATIO
Creatinine, Urine: 140.3 mg/dL
Microalb/Creat Ratio: 10 mg/g creat (ref 0–29)
Microalbumin, Urine: 14.5 ug/mL

## 2020-03-25 LAB — LIPID PANEL
Chol/HDL Ratio: 2.8 ratio (ref 0.0–5.0)
Cholesterol, Total: 168 mg/dL (ref 100–199)
HDL: 59 mg/dL (ref >39)
LDL Chol Calc (NIH): 92 mg/dL (ref 0–99)
Triglycerides: 93 mg/dL (ref 0–149)
VLDL Cholesterol Cal: 17 mg/dL (ref 5–40)

## 2020-03-31 DIAGNOSIS — Z1211 Encounter for screening for malignant neoplasm of colon: Secondary | ICD-10-CM | POA: Diagnosis not present

## 2020-04-05 LAB — FECAL OCCULT BLOOD, IMMUNOCHEMICAL: Fecal Occult Bld: NEGATIVE

## 2020-04-09 ENCOUNTER — Other Ambulatory Visit (INDEPENDENT_AMBULATORY_CARE_PROVIDER_SITE_OTHER): Payer: Medicare Other

## 2020-04-09 ENCOUNTER — Other Ambulatory Visit: Payer: Self-pay

## 2020-04-09 DIAGNOSIS — Z23 Encounter for immunization: Secondary | ICD-10-CM | POA: Diagnosis not present

## 2020-04-10 ENCOUNTER — Other Ambulatory Visit: Payer: Self-pay | Admitting: Family Medicine

## 2020-04-10 DIAGNOSIS — J309 Allergic rhinitis, unspecified: Secondary | ICD-10-CM

## 2020-05-14 ENCOUNTER — Other Ambulatory Visit: Payer: Self-pay | Admitting: Family Medicine

## 2020-05-14 ENCOUNTER — Other Ambulatory Visit: Payer: Self-pay | Admitting: Cardiovascular Disease

## 2020-05-14 DIAGNOSIS — I10 Essential (primary) hypertension: Secondary | ICD-10-CM

## 2020-05-15 ENCOUNTER — Other Ambulatory Visit: Payer: Self-pay | Admitting: Cardiovascular Disease

## 2020-06-02 DIAGNOSIS — Z0120 Encounter for dental examination and cleaning without abnormal findings: Secondary | ICD-10-CM | POA: Diagnosis not present

## 2020-06-19 ENCOUNTER — Other Ambulatory Visit: Payer: Self-pay

## 2020-06-19 ENCOUNTER — Ambulatory Visit: Payer: Medicare Other

## 2020-06-19 ENCOUNTER — Ambulatory Visit (INDEPENDENT_AMBULATORY_CARE_PROVIDER_SITE_OTHER): Payer: Medicare Other

## 2020-06-19 DIAGNOSIS — Z23 Encounter for immunization: Secondary | ICD-10-CM

## 2020-07-07 ENCOUNTER — Other Ambulatory Visit: Payer: Self-pay | Admitting: Family Medicine

## 2020-07-07 DIAGNOSIS — J309 Allergic rhinitis, unspecified: Secondary | ICD-10-CM

## 2020-08-04 ENCOUNTER — Telehealth: Payer: Self-pay | Admitting: Family Medicine

## 2020-08-04 NOTE — Telephone Encounter (Signed)
Advise pt/son-- If only mild symptoms, treat supportively (tylenol for fever, etc). Monoclonal antibody treatment is what I would have recommended if this were a week ago, but there is a shortage and I don't think that he would qualify right now.  The new COVID medications are not yet out (maybe in January). So, continue rest, hydration, mucinex DM if needed for cough, tylenol for any fever, pain, salt water gargles for sore throat. Virtual visit if persistent/worsening symptoms.  To UC/ER if any shortness of breath, chest pain, other new/concerning symptoms.

## 2020-08-04 NOTE — Telephone Encounter (Signed)
Son called stating  Pt had positive home test on Friday He is having  Bad sore throat, light cough, congestion Only temp for 1 day  So far his wife is fine, sone was questioning if you thought he needed Any other treatment or have any recommendations   Joycie Peek #  [phone]

## 2020-08-04 NOTE — Telephone Encounter (Signed)
Pt's son advised of Dr. Delford Field response

## 2020-08-18 ENCOUNTER — Other Ambulatory Visit: Payer: Self-pay | Admitting: Cardiovascular Disease

## 2020-09-02 DIAGNOSIS — M545 Low back pain, unspecified: Secondary | ICD-10-CM | POA: Diagnosis not present

## 2020-09-08 DIAGNOSIS — M545 Low back pain, unspecified: Secondary | ICD-10-CM | POA: Diagnosis not present

## 2020-09-11 DIAGNOSIS — M545 Low back pain, unspecified: Secondary | ICD-10-CM | POA: Diagnosis not present

## 2020-09-17 DIAGNOSIS — M545 Low back pain, unspecified: Secondary | ICD-10-CM | POA: Diagnosis not present

## 2020-09-18 DIAGNOSIS — M545 Low back pain, unspecified: Secondary | ICD-10-CM | POA: Diagnosis not present

## 2020-09-24 ENCOUNTER — Ambulatory Visit: Payer: Medicare Other | Admitting: Cardiovascular Disease

## 2020-09-24 DIAGNOSIS — M545 Low back pain, unspecified: Secondary | ICD-10-CM | POA: Diagnosis not present

## 2020-09-25 ENCOUNTER — Encounter: Payer: Medicare Other | Admitting: Family Medicine

## 2020-09-26 ENCOUNTER — Ambulatory Visit: Payer: Medicare Other | Admitting: Cardiovascular Disease

## 2020-09-26 ENCOUNTER — Encounter: Payer: Self-pay | Admitting: Cardiovascular Disease

## 2020-09-26 ENCOUNTER — Other Ambulatory Visit: Payer: Self-pay

## 2020-09-26 VITALS — BP 164/68 | HR 62 | Ht 68.5 in | Wt 191.6 lb

## 2020-09-26 DIAGNOSIS — M545 Low back pain, unspecified: Secondary | ICD-10-CM | POA: Diagnosis not present

## 2020-09-26 DIAGNOSIS — I10 Essential (primary) hypertension: Secondary | ICD-10-CM

## 2020-09-26 MED ORDER — ATORVASTATIN CALCIUM 20 MG PO TABS: 20.0000 mg | ORAL_TABLET | Freq: Every day | ORAL | 3 refills | Status: DC

## 2020-09-26 MED ORDER — LOSARTAN POTASSIUM 100 MG PO TABS: 100.0000 mg | ORAL_TABLET | Freq: Every day | ORAL | 3 refills | Status: DC

## 2020-09-26 NOTE — Progress Notes (Signed)
Peter [name] Date of Birth  [birth year] [facility] / [facility] [id number]     [place], [place]  [address] [phone]  Fax  [phone]   Problems 1. Htn 2. DM 3. Hyperlipidemia 4. Elevated coronary calcium 5.  Mild carotid artery disease ( 40-60 % bilaterally)    87 yo patient of Dr. Verlon [name].  Hx of diabetes, HTN, dyslipidemia, and elevated coronary calcium score.  Also has mild-moderate carotid disease.  Is active but he doesn't walk regularly.  Has a cabin in [place].  No chest pain or dyspnea with his usual activities.    Retired from [employer]    Oct. 3, 2013 -  He has not had any cardiac problems - no chest pain.  He stays avtive but does not walk regularly.  He describes some symptoms c/w claudication but has great pulses in his feet.  His systolic BP has been a bit elevated.  November 07, 2012:  Peter [name] is doing well.  He has been going up to his cabin in [place] periodically.    He measures his BP regularly - some times his systolic readings are a bit high.  He wants to start walking regularly.    He does have some chest tightness when he walks.  He has had stress nuclear tests with Dr Peter [name] which were negative.  He also has a hiatal hernia and esophageal spasm ( according to radiologist)  .  Aug 13 2013:  Peter [name] has done well.  He still has some mild chest discomfort with exertion - especially after he has eaten.    The pain is relieved with Tums.   He has known esophageal spasm. The pain is a  Mild tightness.  He had a myoview study 8 years ago ( Dr. Verlon [name]) which was negative.  He is not inclined to repeat the [facility] unless it is absolutely necessary.   He still gets up to his [place] house in [place].    He is able to work up there for hours without CP or dyspnea.    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  Sept. 9, 2015:  Still active and able to do all of his normal  activities. He had a lifeline screening done.  He has very mild carotid plaque.  We have known about this mild plaque. He also has some symptoms of orthostasis.   February 12, 2016:  Felling well. No CP or dyspnea. Has had a cough since April . Is trying lots of breathing meds.  Is fatigued. Has orthostasis on occasion .  Weakness  in his legs .  Aug. 22, 2018   Peter [name] is seen today .  Has had some eye surgery  No CP Still gets fatigued. ,   Walking up driveway with garbage cans causes fatigue  Overall seems to be feeling fairly well.  April 04, 2018:  Peter [name] is seen today for follow-up visit.  He seems to be doing well. More fatigue No CP ,     Sept. 14, 2020 Peter [name] is seen today for follow-up of his hypertension, hyperlipidemia, mild coronary artery calcifications and mild to moderate carotid artery disease. Gets fatigued with exercise.   Walks his dog.   Active.   Still eating salty pretzles  BP is variable   Sept. 18, 2022: Peter [name] is seen today for follow up of his HTN, coronary art.  Calcifications BP has been on the high side  Will increase the losartan  sold his cabiin in [name]   Current Outpatient Medications on File Prior to Visit  Medication Sig Dispense Refill  . ANORO ELLIPTA 62.5-25 MCG/INH AEPB INHALE 1 PUFF ONCE DAILY. 60 each 3  . aspirin EC 81 MG tablet Take 81 mg by mouth daily.    Marland Kitchen atorvastatin (LIPITOR) 20 MG tablet TAKE 1 TABLET ONCE DAILY. 90 tablet 3  . carvedilol (COREG) 25 MG tablet TAKE 1 TABLET BY MOUTH TWICE DAILY. 180 tablet 1  . fluticasone (FLONASE) 50 MCG/ACT nasal spray USE 2 SPRAYS IN EACH NOSTRIL ONCE DAILY 48 g 0  . incobotulinumtoxinA (XEOMIN) 50 units SOLR injection Inject 50 Units into the muscle every 3 (three) months.    Marland Kitchen losartan (COZAAR) 50 MG tablet Take 1 tablet (50 mg total) by mouth daily. Please call to schedule appointment for future refills. Thank You 90 tablet 0  . sildenafil (VIAGRA) 100 MG tablet Take 0.5-1 tablets  (50-100 mg total) by mouth daily as needed for erectile dysfunction. 30 tablet 1  . VENTOLIN HFA 108 (90 Base) MCG/ACT inhaler INHALE 2 PUFFS EVERY 6 HOURS AS NEEDED FOR WHEEZING OR SHORTNESS OF BREATH. 18 g 0   Current Facility-Administered Medications on File Prior to Visit  Medication Dose Route Frequency Provider Last Rate Last Admin  . incobotulinumtoxinA (XEOMIN) 50 units injection 50 Units  50 Units Intramuscular Q90 days Peter Pacas, MD   50 Units at 10/03/19 1731   he is not taking the norvasc currently.   Allergies  Allergen Reactions  . Iodine Other (See Comments)    Feels like he is "burning up"    Past Medical History:  Diagnosis Date  . Adenomatous colon polyp 07/2013  . Asthma h/o as a child-s/p immunotherapy  . Diabetes mellitus diet controlled  . 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 FHx: colon cancer   . Hiatal hernia   . Hypercholesteremia   . Hypertension age 46  . Internal hemorrhoids 07/2013   noted on colonoscopy  . Restlessness and agitation 08/13/2014  . 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 APPENDECTOMY    . BLEPHAROPLASTY Bilateral 12/13/2016  . CARDIOVASCULAR STRESS TEST  normal  . CATARACT EXTRACTION, BILATERAL  07/2014, 08/2014  . COLONOSCOPY  3/08, 07/2013   no repeat rec due to age per Dr. Oletta [name]  . VASECTOMY      Social History   Tobacco Use  Smoking Status Former Smoker  . Quit date: 08/09/1974  . Years since quitting: 46.1  Smokeless Tobacco Never Used    Social History   Substance and Sexual Activity  Alcohol Use Yes  . Alcohol/week: 0.0 standard drinks   Comment: some days none, some days 2-3; 8-10/week, mainly light beer or red wine    Family History  Problem Relation Age of Onset  . Alcohol abuse Father   . Cancer Sister        stomach/?pancreatic  . Colon cancer Brother 52  . Cancer Brother        colon cancer in mid  25's  . Arthritis Daughter        psoriatic  . Hyperlipidemia Son   . Arthritis Daughter        psoriatic  . Hyperlipidemia Son   . Diabetes Maternal Grandmother   . Cancer Sister  lung and brain  . COPD Brother   . Stroke Neg Hx   . Heart disease Neg Hx     Reviw of Systems:  Reviewed in the HPI.  All other systems are negative.   Physical Exam: Blood pressure (!) 164/68, pulse 62, height 5' 8.5" (1.74 m), weight 191 lb 9.6 oz (86.9 kg), SpO2 98 %.  GEN:  Well nourished, well developed in no acute distress, elderly male,    HEENT: Normal NECK: No JVD; No carotid bruits LYMPHATICS: No lymphadenopathy CARDIAC: RRR , no murmurs, rubs, gallops RESPIRATORY:  Clear to auscultation without rales, wheezing or rhonchi  ABDOMEN: Soft, non-tender, non-distended MUSCULOSKELETAL:   Trace edema in lower legs.  SKIN: Warm and dry NEUROLOGIC:  Alert and oriented x 3    ECG:        Assessment / Plan:   1. HTN-     BP is mildly elevated.  Will increase his Losartan to 100 mg a day  BMP in 3 weeks.   2. DM -  Managed by primary   3. Hyperlipidemia - managed by primary care   4. Elevated coronary calcium -    . No angina   5.  Mild carotid artery disease ( 40-60 % bilaterally)- asymptomatic .           Mertie Moores, MD  09/26/2020 2:07 PM    [address],  [place], [place]  [address] Pager [phone] Phone: [phone]; Fax: [phone]

## 2020-09-26 NOTE — Patient Instructions (Signed)
Medication Instructions:  Your physician has recommended you make the following change in your medication:   INCREASE Losartan to 100mg  daily.   *If you need a refill on your cardiac medications before your next appointment, please call your pharmacy*   Lab Work: Your physician recommends that you return for lab work in: 3 weeks for a BMET    Testing/Procedures: none   Follow-Up: At [facility], you and your health needs are our priority.  As part of our continuing mission to provide you with exceptional heart care, we have created designated Provider Care Teams.  These Care Teams include your primary Cardiologist (physician) and Advanced Practice Providers (APPs -  Physician Assistants and Nurse Practitioners) who all work together to provide you with the care you need, when you need it.  We recommend signing up for the patient portal called "MyChart".  Sign up information is provided on this After Visit Summary.  MyChart is used to connect with patients for Virtual Visits (Telemedicine).  Patients are able to view lab/test results, encounter notes, upcoming appointments, etc.  Non-urgent messages can be sent to your provider as well.   To learn more about what you can do with MyChart, go to [email or web address].    Your next appointment:   6 month(s)  The format for your next appointment:   In Person  Provider:   You will see one of the following Advanced Practice Providers on your designated Care Team:    Richardson Dopp, PA-C  Vin Campbell, [place]

## 2020-10-01 DIAGNOSIS — M545 Low back pain, unspecified: Secondary | ICD-10-CM | POA: Diagnosis not present

## 2020-10-02 DIAGNOSIS — M545 Low back pain, unspecified: Secondary | ICD-10-CM | POA: Diagnosis not present

## 2020-10-07 DIAGNOSIS — D2261 Melanocytic nevi of right upper limb, including shoulder: Secondary | ICD-10-CM | POA: Diagnosis not present

## 2020-10-07 DIAGNOSIS — D2262 Melanocytic nevi of left upper limb, including shoulder: Secondary | ICD-10-CM | POA: Diagnosis not present

## 2020-10-07 DIAGNOSIS — L814 Other melanin hyperpigmentation: Secondary | ICD-10-CM | POA: Diagnosis not present

## 2020-10-07 DIAGNOSIS — Z85828 Personal history of other malignant neoplasm of skin: Secondary | ICD-10-CM | POA: Diagnosis not present

## 2020-10-08 DIAGNOSIS — M545 Low back pain, unspecified: Secondary | ICD-10-CM | POA: Diagnosis not present

## 2020-10-15 DIAGNOSIS — M545 Low back pain, unspecified: Secondary | ICD-10-CM | POA: Diagnosis not present

## 2020-10-16 ENCOUNTER — Other Ambulatory Visit: Payer: Self-pay

## 2020-10-16 ENCOUNTER — Other Ambulatory Visit: Payer: Medicare Other | Admitting: *Deleted

## 2020-10-16 DIAGNOSIS — I10 Essential (primary) hypertension: Secondary | ICD-10-CM | POA: Diagnosis not present

## 2020-10-16 LAB — BASIC METABOLIC PANEL
BUN/Creatinine Ratio: 22 (ref 10–24)
BUN: 29 mg/dL — ABNORMAL HIGH (ref 8–27)
CO2: 21 mmol/L (ref 20–29)
Calcium: 8.9 mg/dL (ref 8.6–10.2)
Chloride: 103 mmol/L (ref 96–106)
Creatinine, Ser: 1.32 mg/dL — ABNORMAL HIGH (ref 0.76–1.27)
Glucose: 125 mg/dL — ABNORMAL HIGH (ref 65–99)
Potassium: 5.1 mmol/L (ref 3.5–5.2)
Sodium: 140 mmol/L (ref 134–144)
eGFR: 52 mL/min/{1.73_m2} — ABNORMAL LOW (ref >59)

## 2020-10-23 DIAGNOSIS — M545 Low back pain, unspecified: Secondary | ICD-10-CM | POA: Diagnosis not present

## 2020-10-25 ENCOUNTER — Other Ambulatory Visit: Payer: Self-pay | Admitting: Cardiovascular Disease

## 2020-10-30 DIAGNOSIS — M545 Low back pain, unspecified: Secondary | ICD-10-CM | POA: Diagnosis not present

## 2020-11-06 DIAGNOSIS — M545 Low back pain, unspecified: Secondary | ICD-10-CM | POA: Diagnosis not present

## 2020-11-13 DIAGNOSIS — M545 Low back pain, unspecified: Secondary | ICD-10-CM | POA: Diagnosis not present

## 2020-11-18 DIAGNOSIS — M545 Low back pain, unspecified: Secondary | ICD-10-CM | POA: Diagnosis not present

## 2020-11-19 ENCOUNTER — Other Ambulatory Visit: Payer: Self-pay | Admitting: Family Medicine

## 2020-11-19 DIAGNOSIS — J309 Allergic rhinitis, unspecified: Secondary | ICD-10-CM

## 2020-11-25 DIAGNOSIS — M545 Low back pain, unspecified: Secondary | ICD-10-CM | POA: Diagnosis not present

## 2020-12-02 DIAGNOSIS — M545 Low back pain, unspecified: Secondary | ICD-10-CM | POA: Diagnosis not present

## 2020-12-09 ENCOUNTER — Other Ambulatory Visit: Payer: Self-pay | Admitting: Family Medicine

## 2020-12-09 DIAGNOSIS — J441 Chronic obstructive pulmonary disease with (acute) exacerbation: Secondary | ICD-10-CM

## 2020-12-10 DIAGNOSIS — M545 Low back pain, unspecified: Secondary | ICD-10-CM | POA: Diagnosis not present

## 2020-12-24 DIAGNOSIS — M545 Low back pain, unspecified: Secondary | ICD-10-CM | POA: Diagnosis not present

## 2020-12-31 DIAGNOSIS — M545 Low back pain, unspecified: Secondary | ICD-10-CM | POA: Diagnosis not present

## 2021-01-16 ENCOUNTER — Other Ambulatory Visit: Payer: Self-pay | Admitting: Family Medicine

## 2021-01-16 DIAGNOSIS — I10 Essential (primary) hypertension: Secondary | ICD-10-CM

## 2021-01-16 NOTE — Telephone Encounter (Signed)
Has an appt in august

## 2021-01-20 DIAGNOSIS — M545 Low back pain, unspecified: Secondary | ICD-10-CM | POA: Diagnosis not present

## 2021-02-03 DIAGNOSIS — H524 Presbyopia: Secondary | ICD-10-CM | POA: Diagnosis not present

## 2021-02-03 LAB — HM DIABETES EYE EXAM

## 2021-02-11 NOTE — Telephone Encounter (Signed)
Patient called regarding scheduling overdue Xeomin appointment. I scheduled him for 7/26. He has not been seen since February 2021.

## 2021-02-12 DIAGNOSIS — M545 Low back pain, unspecified: Secondary | ICD-10-CM | POA: Diagnosis not present

## 2021-02-18 ENCOUNTER — Encounter: Payer: Self-pay | Admitting: Family Medicine

## 2021-02-18 ENCOUNTER — Other Ambulatory Visit: Payer: Self-pay | Admitting: Family Medicine

## 2021-02-18 DIAGNOSIS — I10 Essential (primary) hypertension: Secondary | ICD-10-CM

## 2021-02-19 ENCOUNTER — Encounter: Payer: Self-pay | Admitting: Podiatry

## 2021-02-19 ENCOUNTER — Ambulatory Visit: Payer: Medicare Other | Admitting: Podiatry

## 2021-02-19 ENCOUNTER — Other Ambulatory Visit: Payer: Self-pay

## 2021-02-19 DIAGNOSIS — M779 Enthesopathy, unspecified: Secondary | ICD-10-CM | POA: Diagnosis not present

## 2021-02-19 DIAGNOSIS — R2681 Unsteadiness on feet: Secondary | ICD-10-CM

## 2021-02-19 DIAGNOSIS — M2041 Other hammer toe(s) (acquired), right foot: Secondary | ICD-10-CM

## 2021-02-22 NOTE — Progress Notes (Signed)
Subjective:   Patient ID: Peter [name], male   DOB: 87 y.o.   MRN: [id number]   HPI Patient presents stating he has had concerns about gait and he is trying to be active but having difficulty presents with caregiver.  Patient does not smoke   Review of Systems  All other systems reviewed and are negative.      Objective:  Physical Exam Vitals and nursing note reviewed.  Constitutional:      Appearance: He is well-developed.  Pulmonary:     Effort: Pulmonary effort is normal.  Musculoskeletal:        General: Normal range of motion.  Skin:    General: Skin is warm.  Neurological:     Mental Status: He is alert.    Vascular status mildly diminished with neurological Sharp dull vibratory with intact but somewhat diminished.  Patient is found to have moderate instability and I watched him walk and he does have a somewhat teetering gait pattern but has not had falls     Assessment:  Gait instability commensurate with his age with no other pathology     Plan:  H&P reviewed condition discussed the consideration for balance bracing but do not recommend currently unless falls were to occur or he is simply becoming inactive due to the stability that is occurring.  At this time he still has a degree of activity and I encouraged him to continue doing what he is doing

## 2021-03-03 ENCOUNTER — Encounter: Payer: Self-pay | Admitting: Neurology

## 2021-03-03 ENCOUNTER — Other Ambulatory Visit: Payer: Self-pay

## 2021-03-03 ENCOUNTER — Ambulatory Visit: Payer: Medicare Other | Admitting: Neurology

## 2021-03-03 VITALS — BP 142/60 | HR 68 | Ht 68.5 in | Wt 186.0 lb

## 2021-03-03 DIAGNOSIS — G5131 Clonic hemifacial spasm, right: Secondary | ICD-10-CM | POA: Diagnosis not present

## 2021-03-03 MED ORDER — INCOBOTULINUMTOXINA 50 UNITS IM SOLR
50.0000 [IU] | INTRAMUSCULAR | Status: DC
  Administered 2021-03-03: 50 [IU] via INTRAMUSCULAR

## 2021-03-03 NOTE — Progress Notes (Signed)
-**  Xeomin 50 units x 1 vial, NDC 0259-1605-01, Lot [id number], Exp 02/2022, office supply.//mck,rn**

## 2021-03-03 NOTE — Progress Notes (Addendum)
PATIENT: Peter [name] DOB: 1930/04/03  Chief Complaint  Patient presents with   Procedure    Xeomin     HISTORICAL  Peter [name] is 87 years old right-handed male, accompanied by his wife, seen in refer by Dr. Brett Fairy for evaluation of EMG guided botulism toxin injection for right hemifacial spasm  I reviewed and summarized his medical history, he had a history of hypertension hyperlipidemia, bilateral cataract surgery, around 2015, he noticed right facial muscle spasm, initially mainly involving muscle around his right eye, over the past few years, gradually getting worse, more frequent, occasionally blocking his right vision, also spreading to right cheek, right upper mouth corner, is present 25% daytime, he also has bilateral senior ptosis, occasionally blocking upper vision, was suggested bilateral blepharoplasty by his ophthalmologist.   He denies a history of right facial palsy, no visual loss, no hearing loss,  He complains of chronic cough for few months, He also complains of chronic low back pain, knee pain, mild gait abnormality.  Update April 01 2016:  We have personally reviewed MRI of brain in July 2017, there is evidence of generalized atrophy, supratentorium small vessel disease, no acute abnormalities.  We reviewed that he has vascular risk factor of aging, hypertension, hyperlipidemia, he should take baby aspirin daily.  Today is his first EMG guided Xeomin injection for right hemifacial spasm, all the questions were answered, potential side effect was explained  UPDATE Jul 07 2016: He responded very well to previous EMG guided Xeomin injection in August 2017 for right hemifacial spasm, the same time he was evaluated by ophthalmologist, is planning on to have right blepharoplasty for redundant tissue at the right upper eyelid.  UPDATE March 09 2017: He had bilateral blepharoplasty, also tear duct procedure, only recent few weeks, he noticed  recurrent right eye, and cheek muscle twitching, last Xeomin injection was on July 07 7016.  UPDATE Jun 15 2017: He did very well with previous injection, there is no significant side effect noticed.  UPDATE Jun 19 2018: Last injection was in 2018, he did very well.  UPDATE Oct 03 2019: He responded very well to previous injection  UPDATE March 03 2021: He return for EMG guided Xeomin injection for right hemifacial spasm, previous injection works well for him  REVIEW OF SYSTEMS: Full 14 system review of systems performed and notable only for as above ALLERGIES: Allergies  Allergen Reactions   Iodine Other (See Comments)    Feels like he is "burning up"    HOME MEDICATIONS: Current Outpatient Medications  Medication Sig Dispense Refill   ANORO ELLIPTA 62.5-25 MCG/INH AEPB INHALE 1 PUFF ONCE DAILY. 60 each 1   aspirin EC 81 MG tablet Take 81 mg by mouth daily.     atorvastatin (LIPITOR) 20 MG tablet Take 1 tablet (20 mg total) by mouth daily. 90 tablet 3   carvedilol (COREG) 25 MG tablet TAKE 1 TABLET BY MOUTH TWICE DAILY. 60 tablet 0   fluticasone (FLONASE) 50 MCG/ACT nasal spray USE 2 SPRAYS IN EACH NOSTRIL ONCE DAILY 48 g 0   incobotulinumtoxinA (XEOMIN) 50 units SOLR injection Inject 50 Units into the muscle every 3 (three) months.     losartan (COZAAR) 100 MG tablet Take 1 tablet (100 mg total) by mouth daily. 90 tablet 3   sildenafil (VIAGRA) 100 MG tablet Take 0.5-1 tablets (50-100 mg total) by mouth daily as needed for erectile dysfunction. 30 tablet 1   VENTOLIN HFA 108 (90 Base) MCG/ACT  inhaler INHALE 2 PUFFS EVERY 6 HOURS AS NEEDED FOR WHEEZING OR SHORTNESS OF BREATH. 18 g 0   No current facility-administered medications for this visit.    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   VASECTOMY      FAMILY HISTORY: Family History  Problem Relation Age of Onset   Alcohol abuse Father    Cancer Sister        stomach/?pancreatic   Colon cancer Brother 39   Cancer Brother        colon cancer in mid 60's   Arthritis Daughter        psoriatic   Hyperlipidemia Son    Arthritis Daughter        psoriatic   Hyperlipidemia Son    Diabetes Maternal Grandmother    Cancer Sister        lung and brain   COPD Brother    Stroke Neg Hx    Heart disease Neg Hx     SOCIAL HISTORY:  Social History   Socioeconomic History   Marital status: Married    Spouse name: Not on file   Number of children: 8   Years of education: Not on file   Highest education level: Not on file  Occupational History   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  Children live in [place], [place], [place], and 2 daughters here in [place], [place], [place]. 16 grandchildren,  9 great-grandchildren   Patient is right handed.  And drinks caffeine daily.   Social Determinants of Health   Financial Resource Strain: Not on file  Food Insecurity: Not on file  Transportation Needs: Not on file  Physical Activity: Not on file  Stress: Not on file  Social Connections: Not on file  Intimate Partner Violence: Not on file     PHYSICAL EXAM   Vitals:   03/03/21 1310  BP: (!) 142/60  Pulse: 68  Weight: 186 lb (84.4 kg)  Height: 5' 8.5" (1.74 m)   Not recorded     Body mass index is 27.87 kg/m.  PHYSICAL EXAMNIATION:  CN VII: He rarely has right hemifacial muscle spasm, mainly involving right orbicularis oculi, occasional involvement of right cheek, upper mouth area   DIAGNOSTIC DATA (LABS, IMAGING, TESTING) - I reviewed patient records, labs, notes, testing and imaging myself where available.   ASSESSMENT AND PLAN  Peter [name] is a 87 y.o. male   Right hemifacial spasm  EMG guided xeomin injection, 50 units unit, used 25 units, discard 25 units  Right orbicularis oculi at  4, 6, 8, 9, 10 clock, 2.5 x5 units each= 12.5 units Left orbicularis oculi at 3, 4:00, 2.5 units each= 5 units  Right corrugate 2.5 units Left corrugate 2.5 units Procerus 2.5 units     [name], M.D. Ph.D.  [facility] [address], [place], [place] [address] Ph: [phone] Fax: [phone]  CC: Rita Ohara, MD

## 2021-03-04 DIAGNOSIS — M545 Low back pain, unspecified: Secondary | ICD-10-CM | POA: Diagnosis not present

## 2021-03-10 DIAGNOSIS — M545 Low back pain, unspecified: Secondary | ICD-10-CM | POA: Diagnosis not present

## 2021-03-24 DIAGNOSIS — M545 Low back pain, unspecified: Secondary | ICD-10-CM | POA: Diagnosis not present

## 2021-03-26 ENCOUNTER — Ambulatory Visit: Payer: Medicare Other | Admitting: Family Medicine

## 2021-03-29 ENCOUNTER — Encounter: Payer: Self-pay | Admitting: Cardiovascular Disease

## 2021-03-29 NOTE — Progress Notes (Signed)
Salvatore Decent Date of Birth  [birth year] [facility] / [facility] [id number]     [place], [place]  [address] [phone]  Fax  [phone]   Problems 1. Htn 2. DM 3. Hyperlipidemia 4. Elevated coronary calcium 5.  Mild carotid artery disease ( 40-60 % bilaterally)    87 yo patient of Dr. Verlon Setting.  Hx of diabetes, HTN, dyslipidemia, and elevated coronary calcium score.  Also has mild-moderate carotid disease.  Is active but he doesn't walk regularly.  Has a cabin in [place].  No chest pain or dyspnea with his usual activities.    Retired from [employer]    Oct. 3, 2013 -  He has not had any cardiac problems - no chest pain.  He stays avtive but does not walk regularly.  He describes some symptoms c/w claudication but has great pulses in his feet.  His systolic BP has been a bit elevated.  November 07, 2012:  Gene is doing well.  He has been going up to his cabin in [place] periodically.    He measures his BP regularly - some times his systolic readings are a bit high.  He wants to start walking regularly.    He does have some chest tightness when he walks.  He has had stress nuclear tests with Dr Verlon Setting which were negative.  He also has a hiatal hernia and esophageal spasm ( according to radiologist)  .  Aug 13 2013:  Gene has done well.  He still has some mild chest discomfort with exertion - especially after he has eaten.    The pain is relieved with Tums.   He has known esophageal spasm. The pain is a  Mild tightness.  He had a myoview study 8 years ago ( Dr. Verlon Setting) which was negative.  He is not inclined to repeat the [facility] unless it is absolutely necessary.   He still gets up to his [place] house in [place].    He is able to work up there for hours without CP or dyspnea.    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  Sept. 9, 2015:  Still active and able to do all of his normal  activities. He had a lifeline screening done.  He has very mild carotid plaque.  We have known about this mild plaque. He also has some symptoms of orthostasis.   February 12, 2016:  Felling well. No CP or dyspnea. Has had a cough since April . Is trying lots of breathing meds.  Is fatigued. Has orthostasis on occasion .  Weakness  in his legs .  Aug. 22, 2018   Gene is seen today .  Has had some eye surgery  No CP Still gets fatigued. ,   Walking up driveway with garbage cans causes fatigue  Overall seems to be feeling fairly well.  April 04, 2018:  Gene is seen today for follow-up visit.  He seems to be doing well. More fatigue No CP ,     Sept. 14, 2020 Romie Minus is seen today for follow-up of his hypertension, hyperlipidemia, mild coronary artery calcifications and mild to moderate carotid artery disease. Gets fatigued with exercise.   Walks his dog.   Active.   Still eating salty pretzles  BP is variable   Sept. 18, 2022: Gene is seen today for follow up of his HTN, coronary art.  Calcifications BP has been on the high side  Will increase the losartan  sold his cabiin in [place]  Aug. 22, 2022 Gene is seen for follow up of his HTN, coronary artery calcification. Seen with his older son, Gene. BP is up today  Uses lots of salt   Current Outpatient Medications on File Prior to Visit  Medication Sig Dispense Refill   ANORO ELLIPTA 62.5-25 MCG/INH AEPB INHALE 1 PUFF ONCE DAILY. 60 each 1   aspirin EC 81 MG tablet Take 81 mg by mouth daily.     atorvastatin (LIPITOR) 20 MG tablet Take 1 tablet (20 mg total) by mouth daily. 90 tablet 3   fluticasone (FLONASE) 50 MCG/ACT nasal spray USE 2 SPRAYS IN EACH NOSTRIL ONCE DAILY 48 g 0   incobotulinumtoxinA (XEOMIN) 50 units SOLR injection Inject 50 Units into the muscle every 3 (three) months.     losartan (COZAAR) 100 MG tablet Take 1 tablet (100 mg total) by mouth daily. 90 tablet 3   sildenafil (VIAGRA) 100 MG tablet Take  0.5-1 tablets (50-100 mg total) by mouth daily as needed for erectile dysfunction. 30 tablet 1   VENTOLIN HFA 108 (90 Base) MCG/ACT inhaler INHALE 2 PUFFS EVERY 6 HOURS AS NEEDED FOR WHEEZING OR SHORTNESS OF BREATH. 18 g 0   [DISCONTINUED] carvedilol (COREG) 25 MG tablet TAKE 1 TABLET BY MOUTH TWICE DAILY. 60 tablet 0   Current Facility-Administered Medications on File Prior to Visit  Medication Dose Route Frequency Provider Last Rate Last Admin   incobotulinumtoxinA (XEOMIN) 50 units injection 50 Units  50 Units Intramuscular Q90 days [name], MD   50 Units at 03/03/21 1331     Allergies  Allergen Reactions   Iodine Other (See Comments)    Feels like he is "burning up"    Past Medical History:  Diagnosis Date   Adenomatous colon polyp 07/2013   Asthma h/o as a child-s/p immunotherapy   Diabetes mellitus diet controlled   Facial nerve spasm 08/13/2014   FHx: colon cancer    Hiatal hernia    Hypercholesteremia    Hypertension age 25   Internal hemorrhoids 07/2013   noted on colonoscopy   Restlessness and agitation 08/13/2014   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 Carilyn Goodpasture      Social History   Tobacco Use  Smoking Status Former   Types: Cigarettes   Quit date: 08/09/1974   Years since quitting: 46.6  Smokeless Tobacco Never    Social History   Substance and Sexual Activity  Alcohol Use Yes   Alcohol/week: 0.0 standard drinks   Comment: some days none, some days 2-3; 8-10/week, mainly light beer or red wine    Family History  Problem Relation Age of Onset   Alcohol abuse Father    Cancer Sister        stomach/?pancreatic   Colon cancer  Brother 15   Cancer Brother        colon cancer in mid 9's   Arthritis Daughter        psoriatic   Hyperlipidemia Son    Arthritis Daughter  psoriatic   Hyperlipidemia Son    Diabetes Maternal Grandmother    Cancer Sister        lung and brain   COPD Brother    Stroke Neg Hx    Heart disease Neg Hx     Reviw of Systems:  Reviewed in the HPI.  All other systems are negative.  Physical Exam: Blood pressure (!) 154/65, pulse 65, height 5' 8.5" (1.74 m), weight 184 lb 12.8 oz (83.8 kg), SpO2 99 %.  GEN:  Well nourished, well developed in no acute distress HEENT: Normal NECK: No JVD; No carotid bruits LYMPHATICS: No lymphadenopathy CARDIAC: RRR , no murmurs, rubs, gallops RESPIRATORY:  Clear to auscultation without rales, wheezing or rhonchi  ABDOMEN: Soft, non-tender, non-distended MUSCULOSKELETAL:  No edema; No deformity  SKIN: Warm and dry NEUROLOGIC:  Alert and oriented x 3    ECG:        Assessment / Plan:   1. HTN-   Gene  eems to be doing well but his blood pressure remains elevated.  He admits that he eats a lot of salt.  In fact he salts all of his food before he even taste it.  He is not inclined to start any new medications.  We will have him reduce his salt intake.  He currently is just taking the carvedilol 25 mg at night.  We will have him split his dose to 12.5 mg twice a day.  His heart rate is already 64 so I am not inclined to increase his carvedilol to 25 twice daily.  2. DM -  3. Hyperlipidemia -followed by his primary medical doctor.  4. Elevated coronary calcium -    .  5.  Mild carotid artery disease ( 40-60 % bilaterally)- asymptomatic .     He has no symptoms of carotid stenosis.    Mertie Moores, MD  03/30/2021 10:26 AM    [place],  [place], [place]  [address] Pager [phone] Phone: [phone]; Fax: [phone]

## 2021-03-30 ENCOUNTER — Ambulatory Visit: Payer: Medicare Other | Admitting: Cardiovascular Disease

## 2021-03-30 ENCOUNTER — Encounter: Payer: Self-pay | Admitting: Cardiovascular Disease

## 2021-03-30 ENCOUNTER — Other Ambulatory Visit: Payer: Self-pay

## 2021-03-30 VITALS — BP 154/65 | HR 65 | Ht 68.5 in | Wt 184.8 lb

## 2021-03-30 DIAGNOSIS — E782 Mixed hyperlipidemia: Secondary | ICD-10-CM | POA: Diagnosis not present

## 2021-03-30 DIAGNOSIS — I10 Essential (primary) hypertension: Secondary | ICD-10-CM | POA: Diagnosis not present

## 2021-03-30 MED ORDER — CARVEDILOL 12.5 MG PO TABS: 12.5000 mg | ORAL_TABLET | Freq: Two times a day (BID) | ORAL | 3 refills | Status: DC

## 2021-03-30 NOTE — Patient Instructions (Addendum)
Medication Instructions:  Your physician has recommended you make the following change in your medication: DECREASE your Carvedilol (Coreg) to 12.5 mg twice daily  *If you need a refill on your cardiac medications before your next appointment, please call your pharmacy*   Lab Work: None Ordered If you have labs (blood work) drawn today and your tests are completely normal, you will receive your results only by: [place] (if you have MyChart) OR A paper copy in the mail If you have any lab test that is abnormal or we need to change your treatment, we will call you to review the results.   Testing/Procedures: None Ordered   Follow-Up: At [facility], you and your health needs are our priority.  As part of our continuing mission to provide you with exceptional heart care, we have created designated Provider Care Teams.  These Care Teams include your primary Cardiologist (physician) and Advanced Practice Providers (APPs -  Physician Assistants and Nurse Practitioners) who all work together to provide you with the care you need, when you need it.    Your next appointment:   3 month(s) on Tuesday Nov. 20 at 2:20 pm  The format for your next appointment:   In Person  Provider:   Mertie Moores, MD   Other Instructions [email or web address]">  DASH Eating Plan DASH stands for Dietary Approaches to Stop Hypertension. The DASH eating plan is a healthy eating plan that has been shown to: Reduce high blood pressure (hypertension). Reduce your risk for type 2 diabetes, heart disease, and stroke. Help with weight loss. What are tips for following this plan? Reading food labels Check food labels for the amount of salt (sodium) per serving. Choose foods with less than 5 percent of the Daily Value of sodium. Generally, foods with less than 300 milligrams (mg) of sodium per serving fit into this eating plan. To find whole grains, look for  the word "whole" as the first word in the ingredient list. Shopping Buy products labeled as "low-sodium" or "no salt added." Buy fresh foods. Avoid canned foods and pre-made or frozen meals. Cooking Avoid adding salt when cooking. Use salt-free seasonings or herbs instead of table salt or sea salt. Check with your health care provider or pharmacist before using salt substitutes. Do not fry foods. Cook foods using healthy methods such as baking, boiling, grilling, roasting, and broiling instead. Cook with heart-healthy oils, such as olive, canola, avocado, soybean, or sunflower oil. Meal planning  Eat a balanced diet that includes: 4 or more servings of fruits and 4 or more servings of vegetables each day. Try to fill one-half of your plate with fruits and vegetables. 6-8 servings of whole grains each day. Less than 6 oz (170 g) of lean meat, poultry, or fish each day. A 3-oz (85-g) serving of meat is about the same size as a deck of cards. One egg equals 1 oz (28 g). 2-3 servings of low-fat dairy each day. One serving is 1 cup (237 mL). 1 serving of nuts, seeds, or beans 5 times each week. 2-3 servings of heart-healthy fats. Healthy fats called omega-3 fatty acids are found in foods such as walnuts, flaxseeds, fortified milks, and eggs. These fats are also found in cold-water fish, such as sardines, salmon, and mackerel. Limit how much you eat of: Canned or prepackaged foods. Food that is high in trans fat, such as some fried foods. Food that is high in saturated fat, such as fatty meat. Desserts and other sweets, sugary drinks,  and other foods with added sugar. Full-fat dairy products. Do not salt foods before eating. Do not eat more than 4 egg yolks a week. Try to eat at least 2 vegetarian meals a week. Eat more home-cooked food and less restaurant, buffet, and fast food.  Lifestyle When eating at a restaurant, ask that your food be prepared with less salt or no salt, if possible. If  you drink alcohol: Limit how much you use to: 0-1 drink a day for women who are not pregnant. 0-2 drinks a day for men. Be aware of how much alcohol is in your drink. In the [place] one drink equals one 12 oz bottle of beer (355 mL), one 5 oz glass of wine (148 mL), or one 1 oz glass of hard liquor (44 mL). General information Avoid eating more than 2,300 mg of salt a day. If you have hypertension, you may need to reduce your sodium intake to 1,500 mg a day. Work with your health care provider to maintain a healthy body weight or to lose weight. Ask what an ideal weight is for you. Get at least 30 minutes of exercise that causes your heart to beat faster (aerobic exercise) most days of the week. Activities may include walking, swimming, or biking. Work with your health care provider or dietitian to adjust your eating plan to your individual calorie needs. What foods should I eat? Fruits All fresh, dried, or frozen fruit. Canned fruit in natural juice (without addedsugar). Vegetables Fresh or frozen vegetables (raw, steamed, roasted, or grilled). Low-sodium or reduced-sodium tomato and vegetable juice. Low-sodium or reduced-sodium tomatosauce and tomato paste. Low-sodium or reduced-sodium canned vegetables. Grains Whole-grain or whole-wheat bread. Whole-grain or whole-wheat pasta. Brown rice. [place]. Bulgur. Whole-grain and low-sodium cereals. Pita bread.Low-fat, low-sodium crackers. Whole-wheat flour tortillas. Meats and other proteins Skinless chicken or [place]. Ground chicken or [place]. Pork with fat trimmed off. Fish and seafood. Egg whites. Dried beans, peas, or lentils. Unsalted nuts, nut butters, and seeds. Unsalted canned beans. Lean cuts of beef with fat trimmed off. Low-sodium, lean precooked or cured meat, such as sausages or meatloaves. Dairy Low-fat (1%) or fat-free (skim) milk. Reduced-fat, low-fat, or fat-free cheeses. Nonfat, low-sodium ricotta or cottage cheese. Low-fat or  nonfatyogurt. Low-fat, low-sodium cheese. Fats and oils Soft margarine without trans fats. Vegetable oil. Reduced-fat, low-fat, or light mayonnaise and salad dressings (reduced-sodium). Canola, safflower, olive, avocado, soybean, andsunflower oils. Avocado. Seasonings and condiments Herbs. Spices. Seasoning mixes without salt. Other foods Unsalted popcorn and pretzels. Fat-free sweets. The items listed above may not be a complete list of foods and beverages you can eat. Contact a dietitian for more information. What foods should I avoid? Fruits Canned fruit in a light or heavy syrup. Fried fruit. Fruit in cream or buttersauce. Vegetables Creamed or fried vegetables. Vegetables in a cheese sauce. Regular canned vegetables (not low-sodium or reduced-sodium). Regular canned tomato sauce and paste (not low-sodium or reduced-sodium). Regular tomato and vegetable juice(not low-sodium or reduced-sodium). Angie Fava. Olives. Grains Baked goods made with fat, such as croissants, muffins, or some breads. Drypasta or rice meal packs. Meats and other proteins Fatty cuts of meat. Ribs. Fried meat. Berniece Salines. Bologna, salami, and other precooked or cured meats, such as sausages or meat loaves. Fat from the back of a pig (fatback). Bratwurst. Salted nuts and seeds. Canned beans with added salt. Canned orsmoked fish. Whole eggs or egg yolks. Chicken or [place] with skin. Dairy Whole or 2% milk, cream, and half-and-half. Whole or full-fat cream  cheese. Whole-fat or sweetened yogurt. Full-fat cheese. Nondairy creamers. Whippedtoppings. Processed cheese and cheese spreads. Fats and oils Butter. Stick margarine. Lard. Shortening. Ghee. Bacon fat. Tropical oils, suchas coconut, palm kernel, or palm oil. Seasonings and condiments Onion salt, garlic salt, seasoned salt, table salt, and sea salt. Worcestershire sauce. Tartar sauce. Barbecue sauce. Teriyaki sauce. Soy sauce, including reduced-sodium. Steak sauce. Canned and  packaged gravies. Fish sauce. Oyster sauce. Cocktail sauce. Store-bought horseradish. Ketchup. Mustard. Meat flavorings and tenderizers. Bouillon cubes. Hot sauces. Pre-made or packaged marinades. Pre-made or packaged taco seasonings. Relishes. Regular saladdressings. Other foods Salted popcorn and pretzels. The items listed above may not be a complete list of foods and beverages you should avoid. Contact a dietitian for more information. Where to find more information National Heart, Lung, and Blood Institute: https://wilson-eaton.com/ American Heart Association: www.heart.org Academy of Nutrition and Dietetics: www.eatright.[place]: www.kidney.org Summary The DASH eating plan is a healthy eating plan that has been shown to reduce high blood pressure (hypertension). It may also reduce your risk for type 2 diabetes, heart disease, and stroke. When on the DASH eating plan, aim to eat more fresh fruits and vegetables, whole grains, lean proteins, low-fat dairy, and heart-healthy fats. With the DASH eating plan, you should limit salt (sodium) intake to 2,300 mg a day. If you have hypertension, you may need to reduce your sodium intake to 1,500 mg a day. Work with your health care provider or dietitian to adjust your eating plan to your individual calorie needs. This information is not intended to replace advice given to you by your health care provider. Make sure you discuss any questions you have with your healthcare provider. Document Revised: 06/29/2019 Document Reviewed: 06/29/2019 Elsevier Patient Education  2022 [employer].

## 2021-03-31 DIAGNOSIS — M545 Low back pain, unspecified: Secondary | ICD-10-CM | POA: Diagnosis not present

## 2021-04-08 DIAGNOSIS — L570 Actinic keratosis: Secondary | ICD-10-CM | POA: Diagnosis not present

## 2021-04-08 DIAGNOSIS — L814 Other melanin hyperpigmentation: Secondary | ICD-10-CM | POA: Diagnosis not present

## 2021-04-08 DIAGNOSIS — L821 Other seborrheic keratosis: Secondary | ICD-10-CM | POA: Diagnosis not present

## 2021-04-08 DIAGNOSIS — Z85828 Personal history of other malignant neoplasm of skin: Secondary | ICD-10-CM | POA: Diagnosis not present

## 2021-04-21 ENCOUNTER — Other Ambulatory Visit: Payer: Self-pay | Admitting: Family Medicine

## 2021-04-21 DIAGNOSIS — J309 Allergic rhinitis, unspecified: Secondary | ICD-10-CM

## 2021-04-22 ENCOUNTER — Telehealth: Payer: Self-pay | Admitting: Family Medicine

## 2021-04-22 ENCOUNTER — Other Ambulatory Visit: Payer: Self-pay | Admitting: Family Medicine

## 2021-04-22 DIAGNOSIS — E7800 Pure hypercholesterolemia, unspecified: Secondary | ICD-10-CM

## 2021-04-22 DIAGNOSIS — Z5181 Encounter for therapeutic drug level monitoring: Secondary | ICD-10-CM

## 2021-04-22 DIAGNOSIS — E118 Type 2 diabetes mellitus with unspecified complications: Secondary | ICD-10-CM

## 2021-04-22 DIAGNOSIS — I10 Essential (primary) hypertension: Secondary | ICD-10-CM

## 2021-04-22 NOTE — Telephone Encounter (Signed)
Left message for pt to call and schedule fasting labs prior to CPE on 9/19. Dr. Tomi Bamberger has entered lab orders

## 2021-04-26 NOTE — Progress Notes (Signed)
Chief Complaint  Patient presents with   Annual Exam    Fasting CPE. No concerns. Has only had 2 covid shot, confirmed in [place] as well. Requesting albuterol inhaler.     Peter [name] is a 87 y.o. male who presents for a complete physical.  His insurance doesn't allow AWV to be done the same day.  We noted this last year, and he was scheduled to come in 09/2020 for AWV and 6 month med check, but he cancelled this visit.  He also cancelled his August physical, but was contacted to get scheduled for today. He did not have fasting labs done prior to appt (though orders were entered). He is fasting today.  He continues to note some balance issues.  He saw Dr. Paulla Dolly for evaluation, didn't find anything wrong or feel that a foot problem is contributing. Has had balance issues for a few years.  Denies any falls.  Occasionally will use a walking stick when he is out and about.  Diabetes:  He has never taken medications for diabetes (he tried Actos for a month or so many years ago with [name]). Denies polydipsia, polyuria.  He doesn't monitor his blood sugars. Last diabetic eye exam was in 01/2021, no retinopathy. Doesn't watch his diet, eats sweets, sugars haven't changed in many years.  He still eats candy and dark chocolate.  Doesn't drink any soft drinks, and limits his carbs in his diet (not much rice or pasta, some bread). He reports checking his feet regularly, and denies numbness/tingling/pain or lesions/sores. Lab Results  Component Value Date   HGBA1C 6.4 (A) 03/24/2020   He gets EMG guided Xeomin injections for right hemifacial spasm by Dr. Krista Blue with good results. Only has them prn, doing well. Last was in 02/2021.  He is doing well now.  Notices it when he smiles, especially in pictures.  He last saw Dr. Acie Fredrickson in 03/2021. He follows his lipids and blood pressure. Losartan dose was increased to '100mg'$  in 09/2020 by him. BP was noted to be high at last visit, and recommendation was to  change from carvedilol '25mg'$  qHS to 12.'5mg'$  BID. (He was supposed to taking '25mg'$  BID prior to visit with Dr. Acie Fredrickson, but was frequently missing, only taking once daily). He has moved his medications to the bathroom, and is much more compliant in taking his medications appropriately (and taking the carvedilol twice daily more regularly than in the past). He has cut back on his salt intake. (He used to salt a lot when cooking). BPs have improved.  Had been 145-148/60; since dose change, they are running low 130's, up to 144/50-60. Pulse 66-70.  H/o elevated coronary calcium, no angina. Gets fatigued walking up the driveway with trash can, unchanged, has to slow down going back up.  He denies any progression or change. He denies any chest pain. He has previously reported a discomfort in the chest in the past, which was relieved by Tums, related to his hiatal hernia, and that is less often than in the past (avoids triggering foods). This now only occurs after spicy foods, alcohol, tomatoes. He has been avoiding these foods and doing much better.  He also has mild carotid artery disease (per Dr. Elmarie Shiley note, reportedly 40-60% bilaterally)  Denies any neurologic symptoms, other than his balance problems, which have remained stable. Some days are worse than others, and he attributes some of it to his eyes (had cataract surgery, ptosis surgery, and tear duct surgery).  Vision hasn't  been as good since those 3 surgeries. He is UTD on eye exams, and told everything is fine.  BP Readings from Last 3 Encounters:  03/30/21 (!) 154/65  03/03/21 (!) 142/60  09/26/20 (!) 164/68   Hyperlipidemia follow-up:  Compliant with medications (atorvastatin '20mg'$ )  and denies medication side effects.   Lab Results  Component Value Date   CHOL 168 03/24/2020   HDL 59 03/24/2020   LDLCALC 92 03/24/2020   TRIG 93 03/24/2020   CHOLHDL 2.8 03/24/2020   COPD (and h/o asthma):  Using Anoro Ellipta off/on, admittedly  infrequently.  He uses it when very active and around sawdust (at the cabin), and it is effective. He has since noticed that the albuterol works a little better.  Is asking for a refill.  Previously got a bad cough once or twice a year (needing zpak and steroids).  Hasn't had recently.  Just some PND that he feels just above his Adam's apple. Has some frequent throat-clearing. Mucus/phlegm is always clear. States he refilled Anoro recently.  Allergies:  These are well controlled currently.  He uses zyrtec seasonally (Spring), not currently.  He uses Flonase regularly. Denies any nasal congestion.  He has some issues with his R shoulder, and uses Advil prn.  Also takes aspirin daily.  Immunization History  Administered Date(s) Administered   Fluad Quad(high Dose 65+) 05/29/2019, 04/09/2020   Influenza Split 05/06/2011, 05/25/2012   Influenza, High Dose Seasonal PF 05/21/2013, 05/22/2014, 06/25/2015, 04/15/2016, 05/04/2017, 07/19/2018   Moderna SARS-COV2 Booster Vaccination 06/19/2020   Moderna Sars-Covid-2 Vaccination 08/23/2019   Pneumococcal Conjugate-13 11/28/2013   Pneumococcal Polysaccharide-23 08/09/2006, 04/15/2016   Tdap 05/06/2011   Zoster Recombinat (Shingrix) 11/21/2019, 03/11/2020   Zoster, Live 06/01/2012   Last colonoscopy: 07/2013, [name], tubular adenoma Last PSA:  2.4 in 12/2016 Dentist: twice yearly   Ophtho: regularly Sees dermatologist twice yearly Exercise: walking the dog, 10 minutes 3/day. Gets 10 minutes of exercise bike 2x/week.  No weight-bearing exercise currently.   He has a living will and healthcare power of attorney   Fall Screen: negative Depression Screen: negative  Other doctors caring for patient include: Sports Med: [name] ENT: Dr. Willa Frater for hearing loss  Neurology: Dr. Brett Fairy, Dr. Krista Blue (giving xeomin injections) Cardiology: Dr. Acie Fredrickson Ophtho: [name] Dermatology: Dr. Elvera Lennox GI: [name]  PMH, [facility], [facility] and [facility]  were reviewed and updated  Outpatient Encounter Medications as of 04/27/2021  Medication Sig Note   ANORO ELLIPTA 62.5-25 MCG/INH AEPB INHALE 1 PUFF ONCE DAILY. 04/27/2021: Uses off and on.   aspirin EC 81 MG tablet Take 81 mg by mouth daily.    atorvastatin (LIPITOR) 20 MG tablet Take 1 tablet (20 mg total) by mouth daily.    carvedilol (COREG) 12.5 MG tablet Take 1 tablet (12.5 mg total) by mouth 2 (two) times daily.    fluticasone (FLONASE) 50 MCG/ACT nasal spray USE 2 SPRAYS IN EACH NOSTRIL ONCE DAILY    incobotulinumtoxinA (XEOMIN) 50 units SOLR injection Inject 50 Units into the muscle every 3 (three) months.    losartan (COZAAR) 100 MG tablet Take 1 tablet (100 mg total) by mouth daily.    sildenafil (VIAGRA) 100 MG tablet Take 0.5-1 tablets (50-100 mg total) by mouth daily as needed for erectile dysfunction. (Patient not taking: Reported on 04/27/2021)    VENTOLIN HFA 108 (90 Base) MCG/ACT inhaler INHALE 2 PUFFS EVERY 6 HOURS AS NEEDED FOR WHEEZING OR SHORTNESS OF BREATH. (Patient not taking: Reported on 04/27/2021)  Facility-Administered Encounter Medications as of 04/27/2021  Medication   incobotulinumtoxinA (XEOMIN) 50 units injection 50 Units   Allergies  Allergen Reactions   Iodine Other (See Comments)    Feels like he is "burning up"    ROS: The patient denies anorexia, fever, headaches, ear pain, exertional chest pain, palpitations, syncope, dyspnea on exertion, swelling, nausea, vomiting, diarrhea, constipation, abdominal pain, melena, hematochezia, hematuria, incontinence, weakened urine stream, dysuria, genital lesions, numbness, tingling, weakness, tremor, suspicious skin lesions, depression, anxiety, abnormal bleeding/bruising, or enlarged lymph nodes. No insomnia +stable hearing loss.   Some dizziness when he first stands up, not new, just occasional, when he jumps up quickly. Nocturia x 3, unchanged. Rare indigestion, r/b Tums, after triggering foods. Sees  dermatologist regularly (twice yearly) Hemifacial spasm improved with botox injections Fatigue--chronic, unchanged Breathing is stable.  Some R shoulder pain, no other joint pains  PHYSICAL EXAM:  BP 140/74   Pulse 68   Ht '5\' 8"'$  (1.727 m)   Wt 183 lb 3.2 oz (83.1 kg)   BMI 27.86 kg/m   136/60 on repeat by MD  Wt Readings from Last 3 Encounters:  04/27/21 183 lb 3.2 oz (83.1 kg)  03/30/21 184 lb 12.8 oz (83.8 kg)  03/03/21 186 lb (84.4 kg)    General Appearance:    Alert, cooperative, no distress, appears stated age.  No significant facial twitching noted  Head:    Normocephalic, without obvious abnormality, atraumatic     Eyes:    PERRL, conjunctiva/corneas clear, EOM's intact, fundi benign     Ears:    Normal TM's and external ear canals.  Nose:    Not examined, wearing mask due to COVID-19 pandemic    Throat:    Not examined, wearing mask due to COVID-19 pandemic   Neck:    Supple, no lymphadenopathy; thyroid: no enlargement/ tenderness/nodules; no carotid bruit or JVD     Back:    Spine nontender, no curvature, ROM normal, no CVA tenderness.  Lungs:    Clear to auscultation bilaterally without wheezes, rales or ronchi; respirations unlabored     Chest Wall:    No tenderness or deformity     Heart:    Regular rate and rhythm, S1 and S2 normal, no rub or gallop. 2/6 SEM noted at RUSB  Breast Exam:    No chest wall tenderness, masses or gynecomastia     Abdomen:    Soft, non-tender, nondistended, normoactive bowel sounds, no masses, no hepatosplenomegaly     Genitalia:    Normal male external genitalia without lesions. Testicles without masses. No inguinal hernias.     Rectal:    Normal sphincter tone, no masses or tenderness; guaiac negative stool. Prostate smooth, no nodules, not enlarged.     Extremities:    No clubbing, cyanosis or edema; normal monofilament exam.  Pulses:    2+ and symmetric all extremities     Skin:    Skin color, texture, turgor normal, no rashes or  lesions.    Lymph nodes:    Cervical, supraclavicular, and axillary nodes normal     Neurologic:    Normal strength, sensation and gait; reflexes 1+ and symmetric throughout.  Negative SLR           Psych:    Normal mood, affect, hygiene and grooming    Normal diabetic foot exam Some discolored toenails  Lab Results  Component Value Date   HGBA1C 6.8 (A) 04/27/2021    ASSESSMENT/PLAN:  Annual physical exam -  Plan: Fecal occult blood, imunochemical([facility])  Controlled type 2 diabetes mellitus with complication, without long-term current use of insulin (HCC) - A1c much higher than prev. Encouraged to cut back on sweets. If remains higher, consider starting metformin. - Plan: TSH, Microalbumin / creatinine urine ratio, HgB A1c  Hyperlipidemia associated with type 2 diabetes mellitus ([place])  Hypertension associated with diabetes ([place])  Screen for colon cancer  Bilateral carotid artery disease, unspecified type ([place]) - asymptomatic. Cont statin  Chronic obstructive pulmonary disease, unspecified COPD type ([place]) - Encouraged compliance with Anoro daily. Refilled albuterol for prn use - Plan: albuterol (VENTOLIN HFA) 108 (90 Base) MCG/ACT inhaler, Spirometry with Graph  Medication monitoring encounter - Plan: Microalbumin / creatinine urine ratio, Comprehensive metabolic panel, Lipid panel, CBC with Differential/Platelet  Essential hypertension, benign - improved since improved compliance in taking coreg BID, and in cutting back on salt intake - Plan: Comprehensive metabolic panel  Pure hypercholesterolemia - on atorvastatin, due for lipids. Will forward results to Dr. Acie Fredrickson - Plan: Lipid panel  Need for influenza vaccination - Plan: Flu Vaccine QUAD High Dose(Fluad)   PSA not recommended based on age, recommended at least 30 minutes of aerobic activity at least 5 days/week, weight-bearing exercise at least 2x/week; proper sunscreen use reviewed; healthy diet and alcohol  recommendations (less than or equal to 2 drinks/day) reviewed; regular seatbelt use; changing batteries in smoke detectors. Immunization recommendations discussed--continue yearly high dose flu shots. Updated COVID booster recommended.Tdap booster due to get from pharmacy. Counseled re: timing of vaccines, to separate by 2 weeks. Colonoscopy recommendations reviewed; technically due (5 years from adenomatous polyp), but due to age and lack of sx, check yearly FIT testing.  If positive, or if anemia, refer back to GI.   Send labs to Dr. Acie Fredrickson.  F/u 3 mos for AWV, repeat A1c at visit.

## 2021-04-26 NOTE — Patient Instructions (Addendum)
  HEALTH MAINTENANCE RECOMMENDATIONS:  It is recommended that you get at least 30 minutes of aerobic exercise at least 5 days/week (for weight loss, you may need as much as 60-90 minutes). This can be any activity that gets your heart rate up. This can be divided in 10-15 minute intervals if needed, but try and build up your endurance at least once a week.  Weight bearing exercise is also recommended twice weekly.  Eat a healthy diet with lots of vegetables, fruits and fiber.  "Colorful" foods have a lot of vitamins (ie green vegetables, tomatoes, red peppers, etc).  Limit sweet tea, regular sodas and alcoholic beverages, all of which has a lot of calories and sugar.  Up to 2 alcoholic drinks daily may be beneficial for men (unless trying to lose weight, watch sugars).  Drink a lot of water.  Sunscreen of at least SPF 30 should be used on all sun-exposed parts of the skin when outside between the hours of 10 am and 4 pm (not just when at beach or pool, but even with exercise, golf, tennis, and yard work!)  Use a sunscreen that says "broad spectrum" so it covers both UVA and UVB rays, and make sure to reapply every 1-2 hours.  Remember to change the batteries in your smoke detectors when changing your clock times in the spring and fall.  Carbon monoxide detectors are recommended for your home.  Use your seat belt every time you are in a car, and please drive safely and not be distracted with cell phones and texting while driving.   Flu shot was given today. I recommend getting the new COVID booster (bivalent, has better coverage for the new variants). You are due for tetanus booster (TdaP).  You will need to get this from the pharmacy.  You will need to separate the above vaccines by 2 weeks, to ensure that they get the best effect.  Please use the Anoro every day. You may use the albuterol if/when needed for any shortness of breath.  You will likely not need this often if/when taking the Anoro  daily.  Consider trying to stay longer on the bike to increase your endurance. Consider using some light weights 2x/week.  Please try and avoid taking ibuprofen (advil), which can harm the stomach and kidneys.  If having ongoing pain, see [name] again (who fixed your other shoulder). I'd prefer you to use Tylenol Arthritis in place of ibuprofen.  Your A1c was up to 6.8% (you had been running below 6.5%).  Please be sure to cut back on the sweets in your diet.  Increasing exercise (such as the exercise bike) and weight loss will also help keep the sugars down.

## 2021-04-27 ENCOUNTER — Other Ambulatory Visit: Payer: Self-pay

## 2021-04-27 ENCOUNTER — Encounter: Payer: Self-pay | Admitting: Family Medicine

## 2021-04-27 ENCOUNTER — Ambulatory Visit (INDEPENDENT_AMBULATORY_CARE_PROVIDER_SITE_OTHER): Payer: Medicare Other | Admitting: Family Medicine

## 2021-04-27 VITALS — BP 136/60 | HR 68 | Ht 68.0 in | Wt 183.2 lb

## 2021-04-27 DIAGNOSIS — E7800 Pure hypercholesterolemia, unspecified: Secondary | ICD-10-CM

## 2021-04-27 DIAGNOSIS — E1159 Type 2 diabetes mellitus with other circulatory complications: Secondary | ICD-10-CM | POA: Diagnosis not present

## 2021-04-27 DIAGNOSIS — I152 Hypertension secondary to endocrine disorders: Secondary | ICD-10-CM

## 2021-04-27 DIAGNOSIS — E1169 Type 2 diabetes mellitus with other specified complication: Secondary | ICD-10-CM | POA: Diagnosis not present

## 2021-04-27 DIAGNOSIS — Z0000 Encounter for general adult medical examination without abnormal findings: Secondary | ICD-10-CM

## 2021-04-27 DIAGNOSIS — J449 Chronic obstructive pulmonary disease, unspecified: Secondary | ICD-10-CM

## 2021-04-27 DIAGNOSIS — Z23 Encounter for immunization: Secondary | ICD-10-CM | POA: Diagnosis not present

## 2021-04-27 DIAGNOSIS — Z5181 Encounter for therapeutic drug level monitoring: Secondary | ICD-10-CM | POA: Diagnosis not present

## 2021-04-27 DIAGNOSIS — I10 Essential (primary) hypertension: Secondary | ICD-10-CM | POA: Diagnosis not present

## 2021-04-27 DIAGNOSIS — Z1211 Encounter for screening for malignant neoplasm of colon: Secondary | ICD-10-CM | POA: Diagnosis not present

## 2021-04-27 DIAGNOSIS — E118 Type 2 diabetes mellitus with unspecified complications: Secondary | ICD-10-CM | POA: Diagnosis not present

## 2021-04-27 DIAGNOSIS — I779 Disorder of arteries and arterioles, unspecified: Secondary | ICD-10-CM | POA: Diagnosis not present

## 2021-04-27 DIAGNOSIS — E785 Hyperlipidemia, unspecified: Secondary | ICD-10-CM

## 2021-04-27 LAB — POCT GLYCOSYLATED HEMOGLOBIN (HGB A1C): Hemoglobin A1C: 6.8 % — AB (ref 4.0–5.6)

## 2021-04-27 MED ORDER — ALBUTEROL SULFATE HFA 108 (90 BASE) MCG/ACT IN AERS: INHALATION_SPRAY | RESPIRATORY_TRACT | 1 refills | Status: AC

## 2021-04-28 LAB — LIPID PANEL
Chol/HDL Ratio: 2.7 ratio (ref 0.0–5.0)
Cholesterol, Total: 142 mg/dL (ref 100–199)
HDL: 52 mg/dL (ref >39)
LDL Chol Calc (NIH): 71 mg/dL (ref 0–99)
Triglycerides: 107 mg/dL (ref 0–149)
VLDL Cholesterol Cal: 19 mg/dL (ref 5–40)

## 2021-04-28 LAB — CBC WITH DIFFERENTIAL/PLATELET
Basophils Absolute: 0.1 10*3/uL (ref 0.0–0.2)
Basos: 1 %
EOS (ABSOLUTE): 0.8 10*3/uL — ABNORMAL HIGH (ref 0.0–0.4)
Eos: 10 %
Hematocrit: 37.9 % (ref 37.5–51.0)
Hemoglobin: 12.6 g/dL — ABNORMAL LOW (ref 13.0–17.7)
Immature Grans (Abs): 0.1 10*3/uL (ref 0.0–0.1)
Immature Granulocytes: 1 %
Lymphocytes Absolute: 1 10*3/uL (ref 0.7–3.1)
Lymphs: 13 %
MCH: 32.4 pg (ref 26.6–33.0)
MCHC: 33.2 g/dL (ref 31.5–35.7)
MCV: 97 fL (ref 79–97)
Monocytes Absolute: 1 10*3/uL — ABNORMAL HIGH (ref 0.1–0.9)
Monocytes: 12 %
Neutrophils Absolute: 5 10*3/uL (ref 1.4–7.0)
Neutrophils: 63 %
Platelets: 255 10*3/uL (ref 150–450)
RBC: 3.89 x10E6/uL — ABNORMAL LOW (ref 4.14–5.80)
RDW: 12.4 % (ref 11.6–15.4)
WBC: 8 10*3/uL (ref 3.4–10.8)

## 2021-04-28 LAB — COMPREHENSIVE METABOLIC PANEL
ALT: 16 IU/L (ref 0–44)
AST: 18 IU/L (ref 0–40)
Albumin/Globulin Ratio: 2.1 (ref 1.2–2.2)
Albumin: 4.7 g/dL — ABNORMAL HIGH (ref 3.6–4.6)
Alkaline Phosphatase: 75 IU/L (ref 44–121)
BUN/Creatinine Ratio: 20 (ref 10–24)
BUN: 27 mg/dL (ref 8–27)
Bilirubin Total: 0.9 mg/dL (ref 0.0–1.2)
CO2: 22 mmol/L (ref 20–29)
Calcium: 9.4 mg/dL (ref 8.6–10.2)
Chloride: 103 mmol/L (ref 96–106)
Creatinine, Ser: 1.34 mg/dL — ABNORMAL HIGH (ref 0.76–1.27)
Globulin, Total: 2.2 g/dL (ref 1.5–4.5)
Glucose: 128 mg/dL — ABNORMAL HIGH (ref 65–99)
Potassium: 5.3 mmol/L — ABNORMAL HIGH (ref 3.5–5.2)
Sodium: 140 mmol/L (ref 134–144)
Total Protein: 6.9 g/dL (ref 6.0–8.5)
eGFR: 51 mL/min/{1.73_m2} — ABNORMAL LOW (ref >59)

## 2021-04-28 LAB — MICROALBUMIN / CREATININE URINE RATIO
Creatinine, Urine: 152.9 mg/dL
Microalb/Creat Ratio: 8 mg/g creat (ref 0–29)
Microalbumin, Urine: 12.6 ug/mL

## 2021-04-28 LAB — TSH: TSH: 5.07 u[IU]/mL — ABNORMAL HIGH (ref 0.450–4.500)

## 2021-04-28 NOTE — Progress Notes (Signed)
Appt has been scheduled.

## 2021-05-07 DIAGNOSIS — Z0000 Encounter for general adult medical examination without abnormal findings: Secondary | ICD-10-CM | POA: Diagnosis not present

## 2021-05-12 LAB — FECAL OCCULT BLOOD, IMMUNOCHEMICAL: Fecal Occult Bld: NEGATIVE

## 2021-05-19 ENCOUNTER — Telehealth: Payer: Self-pay | Admitting: Family Medicine

## 2021-05-19 DIAGNOSIS — M545 Low back pain, unspecified: Secondary | ICD-10-CM | POA: Diagnosis not present

## 2021-05-19 NOTE — Chronic Care Management (AMB) (Signed)
  Chronic Care Management   Outreach Note  05/19/2021 Name: Peter [name] MRN: [id number] DOB: Mar 21, 1930  Referred by: Rita Ohara, MD Reason for referral : No chief complaint on file.   An unsuccessful telephone outreach was attempted today. The patient was referred to the pharmacist for assistance with care management and care coordination.   Follow Up Plan:   Tatjana Dellinger Upstream Scheduler

## 2021-05-25 ENCOUNTER — Ambulatory Visit: Payer: Medicare Other | Admitting: Family Medicine

## 2021-05-26 ENCOUNTER — Telehealth: Payer: Self-pay | Admitting: Family Medicine

## 2021-05-26 DIAGNOSIS — M545 Low back pain, unspecified: Secondary | ICD-10-CM | POA: Diagnosis not present

## 2021-05-26 NOTE — Chronic Care Management (AMB) (Signed)
  Chronic Care Management   Outreach Note  05/26/2021 Name: Peter [name] MRN: [id number] DOB: [birth year]  Referred by: Rita Ohara, MD Reason for referral : No chief complaint on file.   A second unsuccessful telephone outreach was attempted today. The patient was referred to pharmacist for assistance with care management and care coordination.  Follow Up Plan:   Tatjana Dellinger Upstream Scheduler

## 2021-06-02 DIAGNOSIS — M545 Low back pain, unspecified: Secondary | ICD-10-CM | POA: Diagnosis not present

## 2021-06-03 ENCOUNTER — Encounter: Payer: Self-pay | Admitting: Neurology

## 2021-06-03 ENCOUNTER — Ambulatory Visit: Payer: Medicare Other | Admitting: Neurology

## 2021-06-03 VITALS — BP 142/79 | HR 65 | Ht 68.0 in | Wt 189.0 lb

## 2021-06-03 DIAGNOSIS — G5131 Clonic hemifacial spasm, right: Secondary | ICD-10-CM

## 2021-06-03 MED ORDER — INCOBOTULINUMTOXINA 50 UNITS IM SOLR
50.0000 [IU] | INTRAMUSCULAR | Status: DC
  Administered 2021-06-03: 50 [IU] via INTRAMUSCULAR

## 2021-06-03 NOTE — Progress Notes (Signed)
Xeomin 50U vial x1. Lot: [id number]. Exp: 03/2022. NDC: 0259-1605-01 (Buy/bill)

## 2021-06-03 NOTE — Progress Notes (Signed)
    PATIENT: Peter [name] DOB: 11-05-1929  Chief Complaint  Patient presents with   Procedure    Xeomin injection     HISTORICAL  Peter [name] is 87 years old right-handed male, accompanied by his wife, seen in refer by Dr. Brett Fairy for evaluation of EMG guided botulism toxin injection for right hemifacial spasm  I reviewed and summarized his medical history, he had a history of hypertension hyperlipidemia, bilateral cataract surgery, around 2015, he noticed right facial muscle spasm, initially mainly involving muscle around his right eye, over the past few years, gradually getting worse, more frequent, occasionally blocking his right vision, also spreading to right cheek, right upper mouth corner, is present 25% daytime, he also has bilateral senior ptosis, occasionally blocking upper vision, was suggested bilateral blepharoplasty by his ophthalmologist.   He denies a history of right facial palsy, no visual loss, no hearing loss,  He complains of chronic cough for few months, He also complains of chronic low back pain, knee pain, mild gait abnormality.  Update April 01 2016:  We have personally reviewed MRI of brain in July 2017, there is evidence of generalized atrophy, supratentorium small vessel disease, no acute abnormalities.  We reviewed that he has vascular risk factor of aging, hypertension, hyperlipidemia, he should take baby aspirin daily.  Today is his first EMG guided Xeomin injection for right hemifacial spasm, all the questions were answered, potential side effect was explained  UPDATE Jul 07 2016: He responded very well to previous EMG guided Xeomin injection in August 2017 for right hemifacial spasm, the same time he was evaluated by ophthalmologist, is planning on to have right blepharoplasty for redundant tissue at the right upper eyelid.  UPDATE March 09 2017: He had bilateral blepharoplasty, also tear duct procedure, only recent few weeks, he  noticed recurrent right eye, and cheek muscle twitching, last Xeomin injection was on July 07 7016.  UPDATE Jun 15 2017: He did very well with previous injection, there is no significant side effect noticed.  UPDATE Jun 19 2018: Last injection was in 2018, he did very well.  UPDATE Oct 03 2019: He responded very well to previous injection  UPDATE March 03 2021: He return for EMG guided Xeomin injection for right hemifacial spasm, previous injection works well for him  UPDATE Jun 03 2021: He did well with previous injection, return for EMG guided injection,   PHYSICAL EXAMNIATION:   CN VII: He rarely has right hemifacial muscle spasm, mainly involving right orbicularis oculi, occasional involvement of right cheek, upper mouth area   DIAGNOSTIC DATA (LABS, IMAGING, TESTING) - I reviewed patient records, labs, notes, testing and imaging myself where available.   ASSESSMENT AND PLAN  Peter [name] is a 87 y.o. male   Right hemifacial spasm  EMG guided xeomin injection, 50 units unit, used 35 units, discard 15 units  Right orbicularis oculi at  4, 6, 8, 9, 10 clock, 2.5 x5 units each= 12.5 units Left orbicularis oculi at 3, 4:00, 2.5 units each= 5 units  Right corrugate 2.5 units Left corrugate 2.5 units Procerus 2.5 units  Right frontalis 5 units Left frontalis 5 units     [name], M.D. Ph.D.  [facility] [address], [place], [place] [address] Ph: [phone] Fax: [phone]  CC: Rita Ohara, MD

## 2021-06-09 DIAGNOSIS — M545 Low back pain, unspecified: Secondary | ICD-10-CM | POA: Diagnosis not present

## 2021-06-23 ENCOUNTER — Ambulatory Visit: Payer: Medicare Other | Admitting: Cardiovascular Disease

## 2021-06-23 DIAGNOSIS — M545 Low back pain, unspecified: Secondary | ICD-10-CM | POA: Diagnosis not present

## 2021-06-30 ENCOUNTER — Ambulatory Visit: Payer: Medicare Other | Admitting: Sports Medicine

## 2021-06-30 ENCOUNTER — Ambulatory Visit: Payer: Self-pay

## 2021-06-30 VITALS — BP 162/95 | Ht 69.0 in | Wt 187.0 lb

## 2021-06-30 DIAGNOSIS — S46211A Strain of muscle, fascia and tendon of other parts of biceps, right arm, initial encounter: Secondary | ICD-10-CM

## 2021-06-30 DIAGNOSIS — G8929 Other chronic pain: Secondary | ICD-10-CM | POA: Diagnosis not present

## 2021-06-30 DIAGNOSIS — S29011A Strain of muscle and tendon of front wall of thorax, initial encounter: Secondary | ICD-10-CM | POA: Diagnosis not present

## 2021-06-30 DIAGNOSIS — M545 Low back pain, unspecified: Secondary | ICD-10-CM | POA: Diagnosis not present

## 2021-06-30 DIAGNOSIS — M25511 Pain in right shoulder: Secondary | ICD-10-CM

## 2021-06-30 MED ORDER — METHYLPREDNISOLONE ACETATE 40 MG/ML IJ SUSP
40.0000 mg | Freq: Once | INTRAMUSCULAR | Status: AC
  Administered 2021-06-30: 40 mg via INTRA_ARTICULAR

## 2021-06-30 NOTE — Patient Instructions (Signed)
It was great to see you today!  -On ultrasound we found that you have a pec tear and biceps tear. -No work in PT on this right arm for one week. -After one week, ok to ease back into activities.  -Work on easy bicep curls and pec flys  Have a Happy Holiday!

## 2021-06-30 NOTE — Assessment & Plan Note (Signed)
This is chronic and may benefit from the injection for the pectoralis tear. He will rest this for 1 week and then begin some curls and forearm rolls to help strengthen the biceps

## 2021-06-30 NOTE — Assessment & Plan Note (Signed)
Ultrasound-guided injection into the seroma was done with corticosteroid to lessen his pain.  It is unlikely that this will heal but we will gradually add some strengthening exercises to keep this functional  Follow-up 1 month

## 2021-06-30 NOTE — Progress Notes (Signed)
Chief complaint right shoulder pain  4 months ago patient threw a garbage bag Into a dumpster. He had some immediate sharp pain in his right shoulder He thought that rest and time will heal this He is still having pain Shoulder does not feel that week Pain sometimes awakens him at night Rolling over on the right shoulder is painful Right shoulder feels stiff for then the left which has been treated for frozen shoulder in the past  Review of systems No associated neck pain No radicular symptoms  Physical exam Pleasant elderly white male in no acute distress   Full passive range of motion of the right shoulder He lacks about 20 degrees of full forward flexion and full abduction and elevation External and internal rotation look good Inspection reveals an 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 Palpation reveals some tenderness near the bicipital groove and along the anterior shoulder Internal rotation and external rotation strength are still good Speeds and Yergason both cause pain Empty can is strong Hawkins test is somewhat painful but is strong  Ultrasound of right shoulder The proximal biceps groove appears empty On long axis there appears to be hypoechoic area suggestive of a seroma There is a separation and retraction of the pectoralis major tendon on the superior aspect from the humerus This left a hypoechoic loculated area of 3 x 1-1/2 cm suggestive of a seroma Supraspinatus is intact Infraspinatus and teres minor are intact Posterior labrum is calcified AC joint shows arthritic change  Impression: Consistent with rupture of the long head of the biceps and there is a partial tear of the pectoralis major with retraction of the tendon.  These 2 tears are filled with hypoechoic fluid suggestive of seromas  Ultrasound and interpretation by Wolfgang Phoenix. Shanelle Clontz, MD  Ultrasound-guided injection After visualizing the seromas in the longitudinal axis I did an in plane  injection The needle was visualized entering the seroma and the cortisone was shown to expand the seroma after injection There were no difficulties in the procedure Patient tolerated this well Consent form was signed and documented Band-Aid was placed over the puncture wound Ethyl chloride spray and alcohol were used for antisepsis

## 2021-07-21 DIAGNOSIS — M545 Low back pain, unspecified: Secondary | ICD-10-CM | POA: Diagnosis not present

## 2021-07-26 NOTE — Patient Instructions (Incomplete)
°  Peter [name] , Thank you for taking time to come for your Medicare Wellness Visit. I appreciate your ongoing commitment to your health goals. Please review the following plan we discussed and let me know if I can assist you in the future.   This is a list of the screening recommended for you and due dates:  Health Maintenance  Topic Date Due   COVID-19 Vaccine (2 - Moderna series) 07/17/2020   Tetanus Vaccine  05/05/2021   Hemoglobin A1C  10/25/2021   Eye exam for diabetics  02/03/2022   Complete foot exam   04/27/2022   Pneumonia Vaccine  Completed   Flu Shot  Completed   Zoster (Shingles) Vaccine  Completed   HPV Vaccine  Aged Out    Please get your tetanus booster from the pharmacy (TdaP).

## 2021-07-26 NOTE — Progress Notes (Deleted)
No chief complaint on file.   Peter [name] is a 87 y.o. male who presents for a Medicare Annual Wellness visit. He has diabetes which has always been diet controlled, never took meds.  His A1c was up to 6.8% at his September visit; previously had been running 6.5% or less since 2018 (up to 6.9 in 2017).  He really doesn't watch his diet too carefully, but was asked to be more careful.  Due for recheck today.  Change in diet?  Pt sees neuro for R hemifacial spasm, controlled with Xeomin injections.  He is under the care of Dr. Acie Fredrickson for HTN, hyperlipidemia, elevated coronary calcium and mild carotid artery disease. BP's have been elevated at specialist visit, lower at last visit here in September.  Lipids have been at goal on current regimen.   BP Readings from Last 3 Encounters:  06/30/21 (!) 162/95  06/03/21 (!) 142/79  04/27/21 136/60   Lab Results  Component Value Date   CHOL 142 04/27/2021   HDL 52 04/27/2021   LDLCALC 71 04/27/2021   TRIG 107 04/27/2021   CHOLHDL 2.7 04/27/2021    He has COPD and uses Anoro and albuterol prn only. He has some chronic drainage which is clear. Allergies are controlled with flonase and antihistamines prn  He saw [name] in November for R shoulder pain.  Diagnosed with rupture of long head of biceps, partial tear of pec major, seromas injected by [name].   Immunization History  Administered Date(s) Administered   Fluad Quad(high Dose 65+) 05/29/2019, 04/09/2020, 04/27/2021   Influenza Split 05/06/2011, 05/25/2012   Influenza, High Dose Seasonal PF 05/21/2013, 05/22/2014, 06/25/2015, 04/15/2016, 05/04/2017, 07/19/2018   Moderna SARS-COV2 Booster Vaccination 06/19/2020   Moderna Sars-Covid-2 Vaccination 08/23/2019   Pneumococcal Conjugate-13 11/28/2013   Pneumococcal Polysaccharide-23 08/09/2006, 04/15/2016   Tdap 05/06/2011   Zoster Recombinat (Shingrix) 11/21/2019, 03/11/2020   Zoster, Live 06/01/2012   Last colonoscopy:  07/2013, [name], tubular adenoma Last PSA:  2.4 in 12/2016, no longer screened Dentist: twice yearly   Ophtho: regularly Sees dermatologist twice yearly Exercise:  walking the dog, 10 minutes 3/day. Gets 10 minutes of exercise bike 2x/week.  No weight-bearing exercise currently.   Patient Care Team: Rita Ohara, MD as PCP - General (Family Medicine) Nahser, Wonda Cheng, MD as PCP - Cardiology (Cardiology) Stefanie Libel, MD as Consulting Physician (Sports Medicine) Rozetta Nunnery, MD as Consulting Physician (Otolaryngology) Dohmeier, Asencion Partridge, MD as Consulting Physician (Neurology) [name], MD as Consulting Physician (Neurology) Marygrace Drought, MD as Consulting Physician (Ophthalmology) Harriett Sine, MD as Consulting Physician (Dermatology) Laurence Spates, MD (Inactive) as Consulting Physician (Gastroenterology) [place] for hearing loss    Depression Screening: [place] Office Visit from 04/27/2021 in [place]  PHQ-2 Total Score 0         Falls screen:  Fall Risk  04/27/2021 03/24/2020 02/15/2019 02/13/2018 04/14/2016  Falls in the past year? 0 0 0 No No  Comment - - - - -  Number falls in past yr: 0 - - - -  Injury with Fall? 0 - - - -  Comment - - - - -  Risk for fall due to : No Fall Risks - - - -  Follow up Falls evaluation completed - - - -     Functional Status Survey:        He has a living will and healthcare power of attorney   PMH, [place], [place] and FH were reviewed and updated  ROS: The patient denies anorexia, fever, headaches, ear pain, exertional chest pain, palpitations, syncope, dyspnea on exertion, swelling, nausea, vomiting, diarrhea, constipation, abdominal pain, melena, hematochezia, hematuria, incontinence, weakened urine stream, dysuria, genital lesions, numbness, tingling, weakness, tremor, suspicious skin lesions, depression, anxiety, abnormal bleeding/bruising, or enlarged lymph nodes. No insomnia +stable hearing  loss.   Nocturia x 3, unchanged. Rare indigestion, r/b Tums, after triggering foods. Hemifacial spasm improved with botox injections Fatigue--chronic, unchanged Breathing is stable.  R shoulder pain UPDATE  PHYSICAL EXAM:  There were no vitals taken for this visit.  Wt Readings from Last 3 Encounters:  06/30/21 187 lb (84.8 kg)  06/03/21 189 lb (85.7 kg)  04/27/21 183 lb 3.2 oz (83.1 kg)      ASSESSMENT/PLAN:  A1c  I suspect we are missing COVID date (initial series).  Needs booster? Tdap past due, to get from pharmacy  ?flonase RF  PSA not recommended based on age, recommended at least 30 minutes of aerobic activity at least 5 days/week, weight-bearing exercise at least 2x/week; proper sunscreen use reviewed; healthy diet and alcohol recommendations (less than or equal to 2 drinks/day) reviewed; regular seatbelt use; changing batteries in smoke detectors. Immunization recommendations discussed--continue yearly high dose flu shots.  Updated COVID booster recommended. Tdap booster due to get from pharmacy. Counseled re: timing of vaccines, to separate by 2 weeks. Colonoscopy recommendations reviewed; technically due (5 years from adenomatous polyp), but due to age and lack of sx, we are checking yearly FIT, which was negative in September (If positive, or if anemia, refer back to GI).   F/u based on A1c; CPE due Sept, ?need for med check sooner  Medicare Attestation I have personally reviewed: The patient's medical and social history Their use of alcohol, tobacco or illicit drugs Their current medications and supplements The patient's functional ability including ADLs,fall risks, home safety risks, cognitive, and hearing and visual impairment Diet and physical activities Evidence for depression or mood disorders   The patient's weight, height and BMI have been recorded in the chart.  I have made referrals, counseling, and provided education to the patient based on review  of the above and I have provided the patient with a written personalized care plan for preventive services.

## 2021-07-27 ENCOUNTER — Ambulatory Visit: Payer: Medicare Other | Admitting: Family Medicine

## 2021-07-27 ENCOUNTER — Other Ambulatory Visit: Payer: Self-pay

## 2021-07-27 ENCOUNTER — Encounter: Payer: Self-pay | Admitting: Family Medicine

## 2021-07-27 ENCOUNTER — Ambulatory Visit (INDEPENDENT_AMBULATORY_CARE_PROVIDER_SITE_OTHER): Payer: Medicare Other | Admitting: Family Medicine

## 2021-07-27 VITALS — BP 128/68 | HR 80 | Ht 68.0 in | Wt 188.6 lb

## 2021-07-27 DIAGNOSIS — J309 Allergic rhinitis, unspecified: Secondary | ICD-10-CM

## 2021-07-27 DIAGNOSIS — Z0000 Encounter for general adult medical examination without abnormal findings: Secondary | ICD-10-CM | POA: Diagnosis not present

## 2021-07-27 DIAGNOSIS — N529 Male erectile dysfunction, unspecified: Secondary | ICD-10-CM | POA: Diagnosis not present

## 2021-07-27 DIAGNOSIS — E118 Type 2 diabetes mellitus with unspecified complications: Secondary | ICD-10-CM

## 2021-07-27 LAB — POCT GLYCOSYLATED HEMOGLOBIN (HGB A1C): Hemoglobin A1C: 6.6 % — AB (ref 4.0–5.6)

## 2021-07-27 MED ORDER — FLUTICASONE PROPIONATE 50 MCG/ACT NA SUSP: 2.0000 | Freq: Every day | NASAL | 3 refills | Status: AC

## 2021-07-27 MED ORDER — SILDENAFIL CITRATE 100 MG PO TABS: 50.0000 mg | ORAL_TABLET | Freq: Every day | ORAL | 1 refills | Status: DC | PRN

## 2021-07-27 NOTE — Patient Instructions (Addendum)
Peter [name] , Thank you for taking time to come for your Medicare Wellness Visit. I appreciate your ongoing commitment to your health goals. Please review the following plan we discussed and let me know if I can assist you in the future.    This is a list of the screening recommended for you and due dates:      Health Maintenance  Topic Date Due   COVID-19 Vaccine (2 - Moderna series) 07/17/2020   Tetanus Vaccine  05/05/2021   Hemoglobin A1C  10/25/2021   Eye exam for diabetics  02/03/2022   Complete foot exam   04/27/2022   Pneumonia Vaccine  Completed   Flu Shot  Completed   Zoster (Shingles) Vaccine  Completed   HPV Vaccine  Aged Out      I encourage you to think about getting the COVID bivalent booster.  You can get it here as a nurse visit or at the pharmacy.  You are also due to get your tetanus booster from the pharmacy (TdaP). These two vaccines can either be given the same day, or else they need to be separated by 2 weeks.  Try and get at least 10-15 minutes of exercise in a row (longer than your typical dog walk, but do something that is easier and more manageable--either walking in place, or even seated exercises) Goal is 150 minutes of cardio exercises each week, with a minimum of 10 minutes at a time.   Consider wearing a mask when doing woodworking. You may also use the albuterol inhaler PRIOR to getting short of breath, rather than watiing for the sawdust to bother you.  Good job on cutting back on your salt intake.  Your blood pressure was very good today.  I'd like for you to work on limiting the sweets and sugars (and carbs) in your diet help keep your A1c down  It was down from 6.8% to 6.6% today.

## 2021-07-27 NOTE — Progress Notes (Signed)
Chief Complaint  Patient presents with   Medicare Wellness    Nonfasting AWV, no concerns. Had CPE in Sept.     Peter [name] is a 87 y.o. male who presents for a Medicare Annual Wellness visit. He has diabetes which has always been diet controlled, never took meds.  His A1c was up to 6.8% at his September visit; previously had been running 6.5% or less since 2018 (up to 6.9 in 2017).  He really doesn't watch his diet too carefully, but was asked to be more careful.  Due for recheck today.  He reports today that he cut back on salt in his diet.  Has not cut back on sweets or carbs in his diet. May actually be having more sugar (son buys him candy). Denies polydipsia or polyuria.   Pt sees neuro for R hemifacial spasm, controlled with Xeomin injections.  He is under the care of Dr. Acie Fredrickson for HTN, hyperlipidemia, elevated coronary calcium and mild carotid artery disease. BP's have been elevated at specialist visit, lower at last visit here in September.  Lipids have been at goal on current regimen.    BP Readings from Last 3 Encounters:  07/27/21 128/68  06/30/21 (!) 162/95  06/03/21 (!) 142/79   Lab Results  Component Value Date   CHOL 142 04/27/2021   HDL 52 04/27/2021   LDLCALC 71 04/27/2021   TRIG 107 04/27/2021   CHOLHDL 2.7 04/27/2021    He has COPD/asthma and uses Anoro and albuterol prn only. 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 He knows he should wear a mask when he does this.  He has some chronic drainage which is clear. Allergies are controlled with flonase daily, and antihistamines prn   He saw [name] in November for R shoulder pain.  Diagnosed with rupture of long head of biceps, partial tear of pec major, seromas injected by [name].  He reports the pain is significantly improved (only rarely notices it with certain sleep positions).   Immunization History  Administered Date(s) Administered   Fluad  Quad(high Dose 65+) 05/29/2019, 04/09/2020, 04/27/2021   Influenza Split 05/06/2011, 05/25/2012   Influenza, High Dose Seasonal PF 05/21/2013, 05/22/2014, 06/25/2015, 04/15/2016, 05/04/2017, 07/19/2018   Moderna SARS-COV2 Booster Vaccination 06/19/2020   Moderna Sars-Covid-2 Vaccination 08/23/2019   Pneumococcal Conjugate-13 11/28/2013   Pneumococcal Polysaccharide-23 08/09/2006, 04/15/2016   Tdap 05/06/2011   Zoster Recombinat (Shingrix) 11/21/2019, 03/11/2020   Zoster, Live 06/01/2012    He states he has had 3 COVID vaccines; hasn't had the most recent booster. Last colonoscopy: 07/2013, [name], tubular adenoma Last PSA:  2.4 in 12/2016, no longer screened Dentist: twice yearly   Ophtho: regularly Sees dermatologist twice yearly Exercise: walking the dog, about 30 minutes daily (<10 minutes or less, 4-5 times/day) Limited by fatigue in his LE's. Carries 8# dog around throughout the day. No other weight-bearing exercise.   Patient Care Team: Rita Ohara, MD as PCP - General (Family Medicine) Nahser, Wonda Cheng, MD as PCP - Cardiology (Cardiology) Stefanie Libel, MD as Consulting Physician (Sports Medicine) Rozetta Nunnery, MD as Consulting Physician (Otolaryngology) Dohmeier, Asencion Partridge, MD as Consulting Physician (Neurology) [name], MD as Consulting Physician (Neurology) Marygrace Drought, MD as Consulting Physician (Ophthalmology) Harriett Sine, MD as Consulting Physician (Dermatology) Laurence Spates, MD (Inactive) as Consulting Physician (Gastroenterology) [place] for hearing loss   Depression Screening: [facility] Office Visit from 07/27/2021 in [facility]  PHQ-2 Total Score 0  Falls screen:  Fall Risk  07/27/2021 04/27/2021 03/24/2020 02/15/2019 02/13/2018  Falls in the past year? 0 0 0 0 No  Comment - - - - -  Number falls in past yr: 0 0 - - -  Injury with Fall? 0 0 - - -  Comment - - - - -  Risk for fall due to : No Fall Risks No Fall  Risks - - -  Follow up Falls evaluation completed Falls evaluation completed - - -     Functional Status Survey: Is the patient deaf or have difficulty hearing?: No Does the patient have difficulty seeing, even when wearing glasses/contacts?: No Does the patient have difficulty concentrating, remembering, or making decisions?: Yes (age related short term) Does the patient have difficulty walking or climbing stairs?: No Does the patient have difficulty dressing or bathing?: No Does the patient have difficulty doing errands alone such as visiting a doctor's office or shopping?: No  Might forget why he walked into a room.  No other memory concerns.  Mini-Cog Scoring: 5      He has a living will and healthcare power of attorney     PMH, PSH, SH and FH were reviewed and updated   Outpatient Encounter Medications as of 07/27/2021  Medication Sig Note   aspirin EC 81 MG tablet Take 81 mg by mouth daily.    atorvastatin (LIPITOR) 20 MG tablet Take 1 tablet (20 mg total) by mouth daily.    carvedilol (COREG) 12.5 MG tablet Take 1 tablet (12.5 mg total) by mouth 2 (two) times daily. 07/27/2021: Only takes the second tablet about 50% of the time   incobotulinumtoxinA (XEOMIN) 50 units SOLR injection Inject 50 Units into the muscle every 3 (three) months.    losartan (COZAAR) 100 MG tablet Take 1 tablet (100 mg total) by mouth daily.    [DISCONTINUED] fluticasone (FLONASE) 50 MCG/ACT nasal spray USE 2 SPRAYS IN EACH NOSTRIL ONCE DAILY    albuterol (VENTOLIN HFA) 108 (90 Base) MCG/ACT inhaler INHALE 2 PUFFS EVERY 6 HOURS AS NEEDED FOR WHEEZING OR SHORTNESS OF BREATH. (Patient not taking: Reported on 07/27/2021) 07/27/2021: Uses ocassionally   ANORO ELLIPTA 62.5-25 MCG/INH AEPB INHALE 1 PUFF ONCE DAILY. (Patient not taking: Reported on 07/27/2021) 04/27/2021: Uses off and on.   fluticasone (FLONASE) 50 MCG/ACT nasal spray Place 2 sprays into both nostrils daily.    sildenafil (VIAGRA) 100 MG tablet  Take 0.5-1 tablets (50-100 mg total) by mouth daily as needed for erectile dysfunction.    [DISCONTINUED] sildenafil (VIAGRA) 100 MG tablet Take 0.5-1 tablets (50-100 mg total) by mouth daily as needed for erectile dysfunction. (Patient not taking: Reported on 07/27/2021)    Facility-Administered Encounter Medications as of 07/27/2021  Medication   incobotulinumtoxinA (XEOMIN) 50 units injection 50 Units   incobotulinumtoxinA (XEOMIN) 50 units injection 50 Units   Allergies  Allergen Reactions   Iodine Other (See Comments)    Feels like he is "burning up"      ROS: The patient denies anorexia, fever, headaches, ear pain, exertional chest pain, palpitations, syncope, dyspnea on exertion, swelling, nausea, vomiting, diarrhea, constipation, abdominal pain, melena, hematochezia, hematuria, incontinence, weakened urine stream, dysuria, genital lesions, numbness, tingling, weakness, tremor, suspicious skin lesions, depression, anxiety, abnormal bleeding/bruising, or enlarged lymph nodes. No insomnia +stable hearing loss.   Nocturia x 3, unchanged. Rare indigestion, r/b Tums, after triggering foods. Hemifacial spasm improved with botox injections Fatigue--chronic, unchanged Breathing is stable.  R shoulder pain is much better ED--needs  refill on sildenafil.    PHYSICAL EXAM:   BP 128/68    Pulse 80    Ht 5\' 8"  (1.727 m)    Wt 188 lb 9.6 oz (85.5 kg)    BMI 28.68 kg/m    Wt Readings from Last 3 Encounters:  07/27/21 188 lb 9.6 oz (85.5 kg)  06/30/21 187 lb (84.8 kg)  06/03/21 189 lb (85.7 kg)   Well-appearing, pleasant, elderly male in no distress Some "winking"/spasm of R eye noted. He is alert, oriented and in good spirits. Normal gait, and grossly normal strength.     Lab Results  Component Value Date   HGBA1C 6.6 (A) 07/27/2021      ASSESSMENT/PLAN:   Medicare annual wellness visit, subsequent  Controlled type 2 diabetes mellitus with complication, without long-term  current use of insulin ([place]) - encouraged him to cut back on carbs and sweets in his diet.  - Plan: HgB A1c  Allergic rhinitis, unspecified seasonality, unspecified trigger - continue lfonase - Plan: fluticasone (FLONASE) 50 MCG/ACT nasal spray  Erectile dysfunction, unspecified erectile dysfunction type - Plan: sildenafil (VIAGRA) 100 MG tablet     PSA not recommended based on age, recommended at least 30 minutes of aerobic activity at least 5 days/week, weight-bearing exercise at least 2x/week; proper sunscreen use reviewed; healthy diet and alcohol recommendations (less than or equal to 2 drinks/day) reviewed; regular seatbelt use; changing batteries in smoke detectors. Immunization recommendations discussed--continue yearly high dose flu shots.  Updated COVID booster recommended, declined today (we are also missing one of the initial doses).  Reminded to get Tdap booster due to get from pharmacy. Counseled re: timing of vaccines, to separate by 2 weeks. Colonoscopy recommendations reviewed; technically due (5 years from adenomatous polyp), but due to age and lack of sx, we are checking yearly FIT, which was negative in September (If positive, or if anemia, refer back to GI).     F/u for CPE due Sept    Medicare Attestation I have personally reviewed: The patient's medical and social history Their use of alcohol, tobacco or illicit drugs Their current medications and supplements The patient's functional ability including ADLs,fall risks, home safety risks, cognitive, and hearing and visual impairment Diet and physical activities Evidence for depression or mood disorders   The patient's weight, height and BMI have been recorded in the chart.  I have made referrals, counseling, and provided education to the patient based on review of the above and I have provided the patient with a written personalized care plan for preventive services.

## 2021-07-28 DIAGNOSIS — M545 Low back pain, unspecified: Secondary | ICD-10-CM | POA: Diagnosis not present

## 2021-08-07 DIAGNOSIS — M545 Low back pain, unspecified: Secondary | ICD-10-CM | POA: Diagnosis not present

## 2021-08-11 ENCOUNTER — Ambulatory Visit: Payer: Medicare Other | Admitting: Sports Medicine

## 2021-08-14 ENCOUNTER — Ambulatory Visit: Payer: Medicare Other | Admitting: Sports Medicine

## 2021-08-14 DIAGNOSIS — S29011D Strain of muscle and tendon of front wall of thorax, subsequent encounter: Secondary | ICD-10-CM

## 2021-08-14 NOTE — Assessment & Plan Note (Signed)
The evidence for retraction of the pectoralis is minimal on today's ultrasound This appears to been a much smaller tear Since the seroma has resolved we will keep him on home exercises He is having no pain so he can return as needed

## 2021-08-14 NOTE — Progress Notes (Signed)
Chief complaint right shoulder pain  Patient was seen on June 30, 2021 for a large seroma in the right shoulder partial tear of the pectoralis was expected He had significant pain We did an ultrasound-guided injection He states that since the time of the injection 95% of his pain has resolved He is able to use his shoulder fully and feels like the motion is normal The right shoulder now feels stronger than the left  Physical exam Pleasant older male in no acute distress BP 140/72    Ht 5\' 9"  (1.753 m)    Wt 188 lb (85.3 kg)    BMI 27.76 kg/m   Shoulder:Right  No atrophy or asymmetry. Palpation is normal with no tenderness over AC joint or bicipital groove. ROM is full in all planes. Rotator cuff strength normal throughout. No signs of impingement with negative Neer and Hawkin's tests, empty can. Speeds and Yergason's tests normal. No labral pathology noted with negative Obrien's, negative clunk and good stability. Normal scapular function observed. No painful arc and no drop arm sign. No apprehension sign  Repeat ultrasound His rotator cuff looks intact on standing He now has almost no residual fluid left from the previous seroma There is a very small amount of fluid around the pectoralis which primarily looks intact There is a small amount of fluid near the coracoid under the subscapularis Otherwise the tendons look normal  Impression is resolved seroma of the right shoulder.  Ultrasound and interpretation by Wolfgang Phoenix. Oneida Alar, MD

## 2021-08-27 DIAGNOSIS — M545 Low back pain, unspecified: Secondary | ICD-10-CM | POA: Diagnosis not present

## 2021-09-07 ENCOUNTER — Telehealth: Payer: Self-pay | Admitting: Neurology

## 2021-09-07 ENCOUNTER — Other Ambulatory Visit (HOSPITAL_BASED_OUTPATIENT_CLINIC_OR_DEPARTMENT_OTHER): Payer: Self-pay

## 2021-09-07 ENCOUNTER — Other Ambulatory Visit: Payer: Self-pay

## 2021-09-07 ENCOUNTER — Ambulatory Visit: Payer: Medicare Other | Attending: Internal Medicine

## 2021-09-07 DIAGNOSIS — Z23 Encounter for immunization: Secondary | ICD-10-CM

## 2021-09-07 MED ORDER — MODERNA COVID-19 BIVAL BOOSTER 50 MCG/0.5ML IM SUSP
INTRAMUSCULAR | 0 refills | Status: DC
  Filled 2021-09-07: qty 0.5, 1d supply, fill #0

## 2021-09-07 NOTE — Progress Notes (Signed)
° °  Covid-19 Vaccination Clinic  Name:  Dwaine Pringle    MRN: [id number] DOB: 05-18-1930  09/07/2021  Mr. Macmaster was observed post Covid-19 immunization for 15 minutes without incident. He was provided with Vaccine Information Sheet and instruction to access the V-Safe system.   Mr. Edenfield was instructed to call 911 with any severe reactions post vaccine: Difficulty breathing  Swelling of face and throat  A fast heartbeat  A bad rash all over body  Dizziness and weakness   Immunizations Administered     Name Date Dose VIS Date Route   Moderna Covid-19 vaccine Bivalent Booster 09/07/2021  2:34 PM 0.5 mL 03/21/2021 Intramuscular   Manufacturer: Moderna   Lot: [id number]   Maria Antonia: [phone]

## 2021-09-07 NOTE — Telephone Encounter (Signed)
Pt called to cancel 2/1 Xeomin appointment- going out of town.

## 2021-09-09 ENCOUNTER — Ambulatory Visit: Payer: Medicare Other | Admitting: Neurology

## 2021-09-10 DIAGNOSIS — M545 Low back pain, unspecified: Secondary | ICD-10-CM | POA: Diagnosis not present

## 2021-09-17 ENCOUNTER — Ambulatory Visit: Payer: Self-pay

## 2021-09-17 ENCOUNTER — Ambulatory Visit: Payer: Medicare Other | Admitting: Sports Medicine

## 2021-09-17 VITALS — BP 148/92 | Ht 69.0 in | Wt 188.0 lb

## 2021-09-17 DIAGNOSIS — M79672 Pain in left foot: Secondary | ICD-10-CM

## 2021-09-17 DIAGNOSIS — M79671 Pain in right foot: Secondary | ICD-10-CM | POA: Insufficient documentation

## 2021-09-17 DIAGNOSIS — M25511 Pain in right shoulder: Secondary | ICD-10-CM

## 2021-09-17 DIAGNOSIS — M75101 Unspecified rotator cuff tear or rupture of right shoulder, not specified as traumatic: Secondary | ICD-10-CM

## 2021-09-17 DIAGNOSIS — M12811 Other specific arthropathies, not elsewhere classified, right shoulder: Secondary | ICD-10-CM

## 2021-09-17 NOTE — Patient Instructions (Signed)
It was great to see you today!  -For your shoulder please work on the motion exercises we gave you. -You can ice this and your ankle for 10-15 minutes 2-3 times a day. -For your ankle please wear ankle support while you are up and moving around. -Wear the inserts in your shoes. -Try voltaren gel 3 times daily.  Call us with any questions.

## 2021-09-17 NOTE — Progress Notes (Signed)
PCP: Rita Ohara, MD  Subjective:   HPI: Patient is a 88 y.o. male here for L ankle pain.  On Monday he started having L medial ankle pain while playing cornhole. Denies any inciting trauma or injury to the foot. Initially was a sharp pain if he put pressure on it and coudlnt walk for a day and a half. He then transitioned to using a cane. A couple days after the injury tried biofreeze which was helpful. Currently denies pain when sitting down, and no issues with ambulating. States his feet are weak and have been for years. Started to use son's orthotics yesterday for extra support which really helped.   Additionally he adds that 2 days ago he took a mis step and hit his shoulder on the wall and now is painful and he is not able to lift his shoulder over his head.  He was seen in November of last year and was found to have a rupture of the long head of the biceps and partial tear of the pectoralis major with retraction of the tendon and received a cortisone injection at that time. Today he requests another injection.    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 (Patient not taking: Reported on 07/27/2021) 18 g 1   ANORO ELLIPTA 62.5-25 MCG/INH AEPB INHALE 1 PUFF ONCE DAILY. (Patient not taking: Reported on 07/27/2021) 60 each 1   aspirin EC 81 MG tablet Take 81 mg by  mouth daily.     atorvastatin (LIPITOR) 20 MG tablet Take 1 tablet (20 mg total) by mouth daily. 90 tablet 3   carvedilol (COREG) 12.5 MG tablet Take 1 tablet (12.5 mg total) by mouth 2 (two) times daily. 180 tablet 3   COVID-19 mRNA bivalent vaccine, Moderna, (MODERNA COVID-19 BIVAL BOOSTER) 50 MCG/0.5ML injection Inject into the muscle. 0.5 mL 0   fluticasone (FLONASE) 50 MCG/ACT nasal spray Place 2 sprays into both nostrils daily. 48 g 3   incobotulinumtoxinA (XEOMIN) 50 units SOLR injection Inject 50 Units into the muscle every 3 (three) months.     losartan (COZAAR) 100 MG tablet Take 1 tablet (100 mg total) by mouth daily. 90 tablet 3   sildenafil (VIAGRA) 100 MG tablet Take 0.5-1 tablets (50-100 mg total) by mouth daily as needed for erectile dysfunction. 30 tablet 1   Current Facility-Administered Medications on File Prior to Visit  Medication Dose Route Frequency Provider Last Rate Last Admin   incobotulinumtoxinA (XEOMIN) 50 units injection 50 Units  50 Units Intramuscular Q90 days [name], MD   50 Units at 03/03/21 1331   incobotulinumtoxinA (XEOMIN) 50 units injection 50 Units  50 Units Intramuscular Q90 days [name], MD   50 Units at 06/03/21 1454    Past Surgical History:  Procedure Laterality Date   APPENDECTOMY  BLEPHAROPLASTY Bilateral 12/13/2016   CARDIOVASCULAR STRESS TEST  normal   CATARACT EXTRACTION, BILATERAL  07/2014, 08/2014   COLONOSCOPY  3/08, 07/2013   no repeat rec due to age per [name]   VASECTOMY      Allergies  Allergen Reactions   Iodine Other (See Comments)    Feels like he is "burning up"    BP (!) 148/92    Ht 5\' 9"  (1.753 m)    Wt 188 lb (85.3 kg)    BMI 27.76 kg/m       Objective:  Physical Exam:  Gen: NAD, comfortable in exam room  L Foot: Inspection:  navicular drop/ loss of long arch with pronation/ external rotation of the foot on ankle.. There is mild edema of ankle. No erythema or bruising.  Some puffiness in sinus  tarsi. Palpation: No tenderness to palpation b/l ROM: Full  ROM of the ankle b/l. Strength: 5/5 strength ankle in all planes b/l Neurovascular: N/V intact distally in the lower extremity b/l Special tests: Negative anterior drawer. Negative squeeze. normal midfoot flexibility. Normal calcaneal motion with heel raise   R Shoulder: Inspection reveals no obvious deformity, atrophy, or asymmetry b/l. No bruising. No swelling TTP over [facility] joint  Decreased ROM in abduction, external rotation and elevation Strength is preserved except for resisted abduction and elevation which is weak NV intact distally   Ultrasound of Right Shoulder The footplate attachment of the supraspinatus appears hypoechoic and I cannot see any tendon fibers extending back to the acromion Significant gap with hypoechoic change noted on interval view Subscapularis appears intact but with calcific deposits Infraspinatus appears intact as does teres minor Biceps appears partially intact and at its in insertion to the muscle belly is hyperechoic and scarred  Impression is a complete tear of the supraspinatus with retraction  Ultrasound of left ankle At the medial malleolus there is significant swelling surrounding the flexor tendons Distal to the malleolus there is a split in the posterior tibialis tendon and there is significant edema No bony fragments are identified  Ultrasound and interpretation by Wolfgang Phoenix. Fields, MD   Impression is partial tear of the posterior tibialis tendon and resultant swelling   Assessment & Plan:   1. L ankle pain Patient presents with L ankle pain that started 3 days ago without inciting injury but initially unable to bear weight for 1 and a half days. Currently pain improved and he is able to ambulate without difficulty. On presentation patient with mid foot drop likely causing arthritis and would benefit from arch support. Ultrasound performed which showed partial posterior tibialis  tendon tear with surrounding edema. Recommended icing ankle, wearing ankle support while up and moving around, and wearing shoe inserts. Return precautions discussed.   2. R shoulder pain  Patient re-injured R shoulder 2 days prior when his shoulder hit against a wall. Presents with pain and reduced ROM on abduction. Performed ultrasound which showed complete tear of supraspinatus. Was only able to visualize seroma. Gave patient exercises to do and recommended follow up.  Exercises will focus on maintaining full range of motion I suspect he may be able to substitute other muscles and get his motion back  I observed and examined the patient with the resident and agree with assessment and plan.  Note reviewed and modified by me. Ila Mcgill, MD

## 2021-09-17 NOTE — Assessment & Plan Note (Signed)
Patient had a remote tear I have his right supraspinatus that was not retracted along with some arthritis of the shoulder With his fall 2 days ago I think he completed the tear of his supraspinatus and now has very limited motion We will focus on Codman exercises to see if we can maintain his motion and see if he gradually recovers more function

## 2021-09-17 NOTE — Assessment & Plan Note (Signed)
Patient has navicular drop and loss of his longitudinal arch with pronation Ultrasound would suggest that with the added pressure on his medial foot he got a partial tear of his posterior tibialis tendon We will use ankle support Sports insoles with arch support Reevaluate in 4 to 6 weeks

## 2021-09-30 ENCOUNTER — Ambulatory Visit: Payer: Self-pay

## 2021-09-30 ENCOUNTER — Ambulatory Visit: Payer: Medicare Other | Admitting: Sports Medicine

## 2021-09-30 VITALS — BP 118/68 | Ht 69.0 in | Wt 188.0 lb

## 2021-09-30 DIAGNOSIS — M25511 Pain in right shoulder: Secondary | ICD-10-CM

## 2021-09-30 MED ORDER — METHYLPREDNISOLONE ACETATE 40 MG/ML IJ SUSP
40.0000 mg | Freq: Once | INTRAMUSCULAR | Status: AC
Start: 2021-09-30 — End: 2021-09-30
  Administered 2021-09-30: 40 mg via INTRA_ARTICULAR

## 2021-09-30 NOTE — Progress Notes (Signed)
PCP: Rita Ohara, MD  Subjective:   HPI: Patient is a 88 y.o. male here for follow-up of left foot pain and right shoulder pain.  Patient was previously seen by Dr. Arby Barrette and [name] on 09/17/2021.  He had an incident where he was playing corn hole and had a twinge that caused him pain on the medial aspect of the ankle.  He had an ultrasound at that time which demonstrated a split tear in the posterior tibialis tendon.  He has been wearing a ankle compression sleeve and doing home exercises for ankle stability.  This is helping some, but he is still having some swelling and the pain is extending into the plantar aspect of the midfoot as well.  Right shoulder - Peter [name] has a history of a known complete tear of the right biceps and tear of the supraspinatus noted on ultrasound.  He had a quite extensive seroma that was injected by [name] back in November 2022.  He was seen by [name] few weeks ago for this and given some home exercises for the shoulder.  But he states he still has limited range of motion and the pain is interfering with activities of daily living and even keeping him up at night.  He denies any redness, erythema.  No fever or chills.  Last A1c on 04/27/21 was 6.8. He is diet-controlled.  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 (Patient not taking:  Reported on 07/27/2021) 18 g 1   ANORO ELLIPTA 62.5-25 MCG/INH AEPB INHALE 1 PUFF ONCE DAILY. (Patient not taking: Reported on 07/27/2021) 60 each 1   aspirin EC 81 MG tablet Take 81 mg by mouth daily.     atorvastatin (LIPITOR) 20 MG tablet Take 1 tablet (20 mg total) by mouth daily. 90 tablet 3   carvedilol (COREG) 12.5 MG tablet Take 1 tablet (12.5 mg total) by mouth 2 (two) times daily. 180 tablet 3   COVID-19 mRNA bivalent vaccine, Moderna, (MODERNA COVID-19 BIVAL BOOSTER) 50 MCG/0.5ML injection Inject into the muscle. 0.5 mL 0   fluticasone (FLONASE) 50 MCG/ACT nasal spray Place 2 sprays into both nostrils daily. 48 g 3   incobotulinumtoxinA (XEOMIN) 50 units SOLR injection Inject 50 Units into the muscle every 3 (three) months.     losartan (COZAAR) 100 MG tablet Take 1 tablet (100 mg total) by mouth daily. 90 tablet 3   sildenafil (VIAGRA) 100 MG tablet Take 0.5-1 tablets (50-100 mg total) by mouth daily as needed for erectile dysfunction. 30 tablet 1   Current Facility-Administered Medications on File Prior to Visit  Medication Dose Route Frequency Provider Last Rate Last Admin   incobotulinumtoxinA (XEOMIN) 50 units injection 50 Units  50 Units Intramuscular Q90 days [name], MD   50 Units at 03/03/21 1331   incobotulinumtoxinA [facility])  50 units injection 50 Units  50 Units Intramuscular Q90 days [name], MD   50 Units at 06/03/21 1454    Past Surgical History:  Procedure Laterality Date   APPENDECTOMY     BLEPHAROPLASTY Bilateral 12/13/2016   CARDIOVASCULAR STRESS TEST  normal   CATARACT EXTRACTION, BILATERAL  07/2014, 08/2014   COLONOSCOPY  3/08, 07/2013   no repeat rec due to age per [name]   VASECTOMY      Allergies  Allergen Reactions   Iodine Other (See Comments)    Feels like he is "burning up"    BP 118/68    Ht 5\' 9"  (1.753 m)    Wt 188 lb (85.3 kg)    BMI 27.76 kg/m   No flowsheet data found.  No flowsheet data found.      Objective:  Physical  Exam:  Gen: Well-appearing, in no acute distress; non-toxic CV: Regular Rate. Well-perfused. Warm.  Resp: Breathing unlabored on room air; no wheezing. Psych: Fluid speech in conversation; appropriate affect; normal thought process Neuro: Sensation intact throughout. No gross coordination deficits.  MSK:  - Right shoulder: No erythema, swelling. No significant bony TTP. Limited ROM: restriction in active ROM with forward flexion to 90d, 80d abduction, able to be taken further passively to equivocal side. Negative speed's test. + Pain with resisted ER. Resistance in active IR. Neurovascularly intact.   - Left ankle: + soft tissue swelling notable over lateral ankle. + TTP just posterior to medial maellolus, no bony TTP. + Pain with plantarflexion, resisted dorsiflexion. No pain with inversion/eversion. Negative anterior drawer. Neurovascularly intact.   - Limited MSK U/S, right shoulder: - Absent biceps tendon within bicipital groove in SAX - Retraction of biceps tendon a few CM inferior to groove seen in LAX - Full-thickness supraspinatus insertional tear with retraction. Cortical irregularity of articular surface of GT  - Sub-acromial space identified with thin layer of hypoechoid fluid bursal distension  - Limited MSK U/S, left medial ankle:  - Short and long-axis of PT tendon with split tear and tenosynovitis surrounding - Hypoechoic fluid surrounding PT tendon and FDL tendons without FDL tear - Notable soft tissue swelling over lateral ankle   Assessment & Plan:  1. Left ankle sprain 2. Split tear of posterior tibialis tendon, left 3. 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   Procedure: After informed written consent timeout was performed, patient was seated in chair in exam room. The patient's right shoulder was prepped with Betadine and alcohol swab and utilizing lateral approach with ultrasound guidance, the  patient's subacromial space was injected with 4:1 lidocaine: depomedrol. Patient tolerated the procedure well without immediate complications.  - US guided SAJ injection into right shoulder, tolerated well - continue ankle compression sleeve for ankle - HEP for left ankle and right RTC for strengthening - RICE and elevation for lateral ankle swelling - discussed with Peter [name] and his son that the ankle is simply going to take time to heal. No role for injection therapy into the sheath. If still persists after a few more weeks, consider nitro patch protocol vs formal PT - follow-up PRN  Elba Barman, DO PGY-4, [place]  Addendum:  I was the preceptor for this visit and available for immediate consultation.  Karlton Lemon MD [facility] Boys

## 2021-10-01 DIAGNOSIS — M545 Low back pain, unspecified: Secondary | ICD-10-CM | POA: Diagnosis not present

## 2021-10-04 ENCOUNTER — Other Ambulatory Visit: Payer: Self-pay | Admitting: Cardiovascular Disease

## 2021-10-08 ENCOUNTER — Ambulatory Visit: Payer: Medicare Other | Admitting: Sports Medicine

## 2021-10-08 DIAGNOSIS — M545 Low back pain, unspecified: Secondary | ICD-10-CM | POA: Diagnosis not present

## 2021-10-09 DIAGNOSIS — Z85828 Personal history of other malignant neoplasm of skin: Secondary | ICD-10-CM | POA: Diagnosis not present

## 2021-10-09 DIAGNOSIS — C44622 Squamous cell carcinoma of skin of right upper limb, including shoulder: Secondary | ICD-10-CM | POA: Diagnosis not present

## 2021-10-09 DIAGNOSIS — L814 Other melanin hyperpigmentation: Secondary | ICD-10-CM | POA: Diagnosis not present

## 2021-10-09 DIAGNOSIS — L820 Inflamed seborrheic keratosis: Secondary | ICD-10-CM | POA: Diagnosis not present

## 2021-10-09 DIAGNOSIS — L570 Actinic keratosis: Secondary | ICD-10-CM | POA: Diagnosis not present

## 2021-10-14 ENCOUNTER — Telehealth: Payer: Self-pay | Admitting: Neurology

## 2021-10-14 NOTE — Telephone Encounter (Signed)
ERROR

## 2021-10-15 DIAGNOSIS — M545 Low back pain, unspecified: Secondary | ICD-10-CM | POA: Diagnosis not present

## 2021-10-22 DIAGNOSIS — M545 Low back pain, unspecified: Secondary | ICD-10-CM | POA: Diagnosis not present

## 2021-10-28 ENCOUNTER — Ambulatory Visit: Payer: Medicare Other | Admitting: Sports Medicine

## 2021-10-29 DIAGNOSIS — M545 Low back pain, unspecified: Secondary | ICD-10-CM | POA: Diagnosis not present

## 2021-11-10 ENCOUNTER — Encounter: Payer: Self-pay | Admitting: Neurology

## 2021-11-10 ENCOUNTER — Ambulatory Visit: Payer: Medicare Other | Admitting: Neurology

## 2021-11-10 VITALS — BP 139/64 | HR 74 | Ht 69.0 in | Wt 185.0 lb

## 2021-11-10 DIAGNOSIS — G5131 Clonic hemifacial spasm, right: Secondary | ICD-10-CM | POA: Diagnosis not present

## 2021-11-10 MED ORDER — INCOBOTULINUMTOXINA 50 UNITS IM SOLR
50.0000 [IU] | INTRAMUSCULAR | Status: DC
  Administered 2021-11-10: 50 [IU] via INTRAMUSCULAR

## 2021-11-10 NOTE — Progress Notes (Signed)
Xeomin 50 units  ?[id number] ?[phone] ?Exp-03/2022 ?B/B ?

## 2021-11-10 NOTE — Progress Notes (Signed)
? ? ?  PATIENT: Peter [name] ?DOB: [birth year] ? ?Chief Complaint  ?Patient presents with  ? Procedure  ?  Room 14 xeomin   ?  ? ?HISTORICAL ? ?Peter [name] is 88 years old right-handed male, accompanied by his wife, seen in refer by Dr. Brett Fairy for evaluation of EMG guided botulism toxin injection for right hemifacial spasm ? ?I reviewed and summarized his medical history, he had a history of hypertension hyperlipidemia, bilateral cataract surgery, around 2015, he noticed right facial muscle spasm, initially mainly involving muscle around his right eye, over the past few years, gradually getting worse, more frequent, occasionally blocking his right vision, also spreading to right cheek, right upper mouth corner, is present 25% daytime, he also has bilateral senior ptosis, occasionally blocking upper vision, was suggested bilateral blepharoplasty by his ophthalmologist.  ? ?He denies a history of right facial palsy, no visual loss, no hearing loss, ? ?He complains of chronic cough for few months, He also complains of chronic low back pain, knee pain, mild gait abnormality. ? ?Update April 01 2016: ? ?We have personally reviewed MRI of brain in July 2017, there is evidence of generalized atrophy, supratentorium small vessel disease, no acute abnormalities. ? ?We reviewed that he has vascular risk factor of aging, hypertension, hyperlipidemia, he should take baby aspirin daily. ? ?Today is his first EMG guided Xeomin injection for right hemifacial spasm, all the questions were answered, potential side effect was explained ? ?UPDATE Jul 07 2016: ?He responded very well to previous EMG guided Xeomin injection in August 2017 for right hemifacial spasm, the same time he was evaluated by ophthalmologist, is planning on to have right blepharoplasty for redundant tissue at the right upper eyelid. ? ?UPDATE March 09 2017: ?He had bilateral blepharoplasty, also tear duct procedure, only recent few weeks, he  noticed recurrent right eye, and cheek muscle twitching, last Xeomin injection was on July 07 7016. ? ?UPDATE Jun 15 2017: ?He did very well with previous injection, there is no significant side effect noticed. ? ?UPDATE Jun 19 2018: ?Last injection was in 2018, he did very well. ? ?UPDATE Oct 03 2019: ?He responded very well to previous injection ? ?UPDATE March 03 2021: ?He return for EMG guided Xeomin injection for right hemifacial spasm, previous injection works well for him ? ?UPDATE Jun 03 2021: ?He did well with previous injection, return for EMG guided injection, ? ?UPDATE November 10 2021: ?Bonded well to previous injection ? ?PHYSICAL EXAMNIATION: ?  ?CN VII: He has occasional right hemifacial muscle spasm, mainly involving right orbicularis oculi, occasional involvement of right cheek, upper mouth area and right frontalis muscle ? ? ?DIAGNOSTIC DATA (LABS, IMAGING, TESTING) ?- I reviewed patient records, labs, notes, testing and imaging myself where available. ? ? ?ASSESSMENT AND PLAN ? ?Peter [name] is a 88 y.o. male   ?Right hemifacial spasm ? EMG guided xeomin injection, 50 units unit, used 32.5 units, discard 17.5 units ? ?Right orbicularis oculi at  4, 6, 8, 9, 10 clock, 2.5 x5 units each= 12.5 units ?Left orbicularis oculi at 3, 4:00, 2.5 units each= 5 units ? ?Right corrugate 2.5 units ?Left corrugate 2.5 units ? ?Right frontalis 2.5 units ?Left frontalis 2.5 units ? ?  ? ?[name], M.D. Ph.D. ? ?[facility] ?[address], [address] ?[place], [place] [address] ?Ph: [phone]) [phone] ?Fax: [phone] ? ?CC: Rita Ohara, MD ? ? ?

## 2021-11-19 DIAGNOSIS — M545 Low back pain, unspecified: Secondary | ICD-10-CM | POA: Diagnosis not present

## 2021-11-25 ENCOUNTER — Ambulatory Visit (INDEPENDENT_AMBULATORY_CARE_PROVIDER_SITE_OTHER): Payer: Medicare Other | Admitting: Sports Medicine

## 2021-11-25 ENCOUNTER — Ambulatory Visit: Payer: Self-pay

## 2021-11-25 VITALS — BP 136/78 | Ht 69.0 in | Wt 188.0 lb

## 2021-11-25 DIAGNOSIS — M25511 Pain in right shoulder: Secondary | ICD-10-CM

## 2021-11-25 MED ORDER — NITROGLYCERIN 0.2 MG/HR TD PT24: 0.2000 mg | MEDICATED_PATCH | Freq: Every day | TRANSDERMAL | 1 refills | Status: DC

## 2021-11-25 MED ORDER — METHYLPREDNISOLONE ACETATE 40 MG/ML IJ SUSP
40.0000 mg | Freq: Once | INTRAMUSCULAR | Status: AC
  Administered 2021-11-25: 40 mg via INTRA_ARTICULAR

## 2021-11-25 NOTE — Patient Instructions (Signed)
Gene and Gene,  ? ?Great seeing you guys again.  I am hopeful that with this injection, doing the shoulder exercises once daily, and the Nitropatch that this will get your shoulder pain over the home.  Let me know if you have any questions. ? ?Nitroglycerin Protocol ? ?Apply 1/4 nitroglycerin patch to affected area of the shoulder daily. ?Change position of patch within the affected area every 24 hours. ?You may experience a headache during the first 1-2 weeks of using the patch, these should subside. ?If you experience headaches after beginning nitroglycerin patch treatment, you may take your preferred over the counter pain reliever such as tylenol or ibuprofen. ?Another side effect of the nitroglycerin patch is skin irritation or rash related to patch adhesive. ?Please notify our office if you develop more severe headaches or rash, and stop the patch. ?Tendon healing with nitroglycerin patch may require 12 to 24 weeks depending on the extent of injury. ?Men should not use if taking Viagra, Cialis, or Levitra.  ?Do not use if you have migraines or rosacea.  ? ? ?Cheers, ? ?Elba Barman, DO  ? ?

## 2021-11-25 NOTE — Progress Notes (Signed)
PCP: Rita Ohara, MD ? ?Subjective:  ? ?HPI: ?Peter [name] is a very pleasant 88 y.o. male here for follow-up of chronic right shoulder pain.  He presents with his son who does help provide some of HPI. ? ?Right shoulder pain - this has been a chronic issue for Peter [name].  He had been seen by [name] previously and had an ultrasound back in February which showed a complete tear of the supraspinatus with a extensive seroma.  He had previously injured the right shoulder and on ultrasound back in November 2022 had a complete rupture of the long head of the bicep with a partial tear of the pectoralis major tendon.  He was seen by myself back in mid February and did have a subacromial injection to the right shoulder which did provide him good pain relief for about 1.5 months, although his pain has returned about the last two weeks with newer exacerbation.  He gets pain whe using his arms to push himself to stand and with certain reaching activities.  He does notice some limitation with getting his arm back to put on his belt, although denies any change in restriction in range of motion. Denies any new injury. ? ?Both Peter [name] and his son are very candid about the fact that he has done physical therapy and home exercises in the past, but does not enjoy doing these and does not do them consistently.  Both are in agreement that potential surgery is not an option and is not desired for him.  They both are very realistic about the expectations and wanting to work on minimizing his pain and maximizing his function as much as possible. ? ?He is no longer using his Viagra.  Has not used this anytime recently and does not plan to use in the future.  I did discontinue this from his med list today. ? ?BP 136/78   Ht '5\' 9"'$  (1.753 m)   Wt 188 lb (85.3 kg)   BMI 27.76 kg/m?  ? ?   ? View : No data to display.  ?  ?  ?  ? ? ?   ? View : No data to display.  ?  ?  ?  ? ? ?    ?Objective:  ?Physical Exam: ? ?Gen: Well-appearing, in no acute  distress; non-toxic ?CV: Regular Rate. Well-perfused. Warm.  ?Resp: Breathing unlabored on room air; no wheezing. ?Psych: Fluid speech in conversation; appropriate affect; normal thought process ?Neuro: Sensation intact throughout. No gross coordination deficits.  ?MSK:  ?- Right shoulder: Some generalized TTP throughout the shoulder, although no bony tenderness.  No AC joint TTP.  Inspection yields no erythema, ecchymosis or swelling.  Active range of motion of forward flexion to 115 degrees, able to be taken to about 170 degrees passively.  Active abduction to 110 degrees, able to be taken to 150 degrees passively.  Preserved active and passive external rotation of the right arm, external rotation left arm is limited to a mechanical block to about 30 degrees. Some pain with resisted abduction.  Empty can testing positive.  Significant pain and some weakness with speeds testing and resisted flexion.  Otherwise full rotator cuff strength.  Neurovascular intact distally. ? ?MSK Complete US of Shoulder, right ? ?Patient was seated on exam table and shoulder US examination was performed using high frequency linear probe.  ?-Short and long axis evaluation of the biceps tendon demonstrates absence in the bicipital groove.  Long axis view does show complete tendon tear  with a few cms of retraction. ?-Scanning distally from short axis of the bicep tendon we do identify the pectoralis major insertion which does have some hypoechoic change near the proximal insertion, appearing to be chronic tear.  There is scar tissue formation noted proximal to this. ?-Subscapularis identified (seen as SAJ) with significant cortical irregularity of the humeral head, there is tendinopathy changes within the subscapularis although no full-thickness tearing noted. ?-AC joint identified with mild arthritic change, no effusion noted. ?-Full-thickness supraspinatus insertional tear noted with retraction.  There is again cortical irregularity  noted of the ventricular surface of the greater tubercle.  There is complete resolution of the previous seroma in this area. ?-Infraspinatus identified with some tendinopathic changes, although no tearing.  Teres minor intact without irregularity. ?-Glenohumeral joint identified with mild arthritic change although no bursal distention. ? ?IMPRESSION: Revisualized complete tear of short head of bicep tendon with retraction; revisualize supraspinatus full-thickness insertional tear.  Chronically healed partial pectoralis major tendon tear with scar tissue.  Otherwise tendinopathic changes of the rotator cuff. ? ?  ?Assessment & Plan:  ?1.  Acute on chronic right shoulder pain with exacerbation of his pain x 2 weeks ?2.  Chronic complete tear of biceps tendon, long head right ?3.  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  Rotator cuff arthropathy, right  ? ?Procedure: Subacromial Injection, right Shoulder  ?After discussion on risks, benefits, and indications, informed written consent was obtained. A timeout was then performed. Patient was seated in chair in exam room. The patient's right shoulder was prepped with betadine and alcohol swabs and utilizing lateral approach with ultrasound guidance, the patient's subacromial space was injected with 4:1 mixture of lidocaine:depomedrol via an in-plane approach. Patient tolerated the procedure well without immediate complications.  ? ?We had a lengthy, but very productive conversation with Peter [name] and his son today regarding his right shoulder.  Based on previous exams and recent ultrasound today, he does have full-thickness tearing of the supraspinatus and long head of the bicep tendon.  There are some other tendinopathic changes, although resolution of his seroma.  He does not like taking oral medications, and is not interested in doing physical therapy or surgical options for the shoulder. The thing that has significantly helped this pain in  the past is injection therapy.  We discussed the benefits and risk of this, including weakening of the tendons and possibly further damage to other rotator cuff tendons.  Both Peter [name] and his son would like to focus on pain reduction and maximizing his functionality as much as possible.  They both feel the benefit of a repeated corticosteroid injection outweighs the possible risks.  Through shared decision-making, we did elect to proceed with this today.  I am pleased with no further changes on ultrasound in terms of tendon tearing.  We will start Nitropatch protocol to see if we can get some increased blood flow and vasodilation to help heal some of his other rotator cuff tendons.  He is agreeable to start 10 minutes of home shoulder exercises once daily.  We will see how he does in a few months.  Encouraged to call or return sooner if any issues arise.  He is no longer using Viagra, discussed with him that he cannot use this when using nitroglycerin therapy.  He has not used this for a while, I did discontinue it from his med list today. ? ?Elba Barman, DO ?PGY-4, Sports Medicine Fellow ?[place] ? ?This note  was dictated using Dragon naturally speaking software and may contain errors in syntax, spelling, or content which have not been identified prior to signing this note.  ? ?Addendum:  I was the preceptor for this visit and available for immediate consultation.  Karlton Lemon MD CAQSM ? ?

## 2021-11-26 DIAGNOSIS — M545 Low back pain, unspecified: Secondary | ICD-10-CM | POA: Diagnosis not present

## 2021-12-03 DIAGNOSIS — M545 Low back pain, unspecified: Secondary | ICD-10-CM | POA: Diagnosis not present

## 2021-12-17 DIAGNOSIS — M545 Low back pain, unspecified: Secondary | ICD-10-CM | POA: Diagnosis not present

## 2021-12-24 DIAGNOSIS — M545 Low back pain, unspecified: Secondary | ICD-10-CM | POA: Diagnosis not present

## 2021-12-29 DIAGNOSIS — M545 Low back pain, unspecified: Secondary | ICD-10-CM | POA: Diagnosis not present

## 2022-01-02 ENCOUNTER — Other Ambulatory Visit: Payer: Self-pay | Admitting: Cardiovascular Disease

## 2022-01-05 DIAGNOSIS — M545 Low back pain, unspecified: Secondary | ICD-10-CM | POA: Diagnosis not present

## 2022-01-13 ENCOUNTER — Ambulatory Visit (INDEPENDENT_AMBULATORY_CARE_PROVIDER_SITE_OTHER): Payer: Medicare Other | Admitting: Sports Medicine

## 2022-01-13 VITALS — BP 142/72 | Ht 69.0 in | Wt 188.0 lb

## 2022-01-13 DIAGNOSIS — M75101 Unspecified rotator cuff tear or rupture of right shoulder, not specified as traumatic: Secondary | ICD-10-CM

## 2022-01-13 DIAGNOSIS — M25511 Pain in right shoulder: Secondary | ICD-10-CM

## 2022-01-13 DIAGNOSIS — G8929 Other chronic pain: Secondary | ICD-10-CM

## 2022-01-13 DIAGNOSIS — M12811 Other specific arthropathies, not elsewhere classified, right shoulder: Secondary | ICD-10-CM

## 2022-01-13 MED ORDER — LIDOCAINE 5 % EX PTCH
1.0000 | MEDICATED_PATCH | CUTANEOUS | 0 refills | Status: DC
Start: 2022-01-13 — End: 2022-04-14

## 2022-01-13 NOTE — Patient Instructions (Addendum)
Gene,  Great seeing you today. Have fun at the zoo and enjoy the BIG 9-0!  Things to try for the shoulder:  Topical gels: -Topical Voltaren gel 1% -Arnica gel -IcyHot/Biofreeze -Capsaicin cream  Topical patches: -Lidocaine patch -Salonpas  You will start back on the nitroglycerin patch, change the patch once daily every 24 hours.  If you need anything, do not hesitate to give me a call.  Elba Barman, DO

## 2022-01-13 NOTE — Progress Notes (Signed)
PCP: Rita Ohara, MD  Subjective:   HPI: Peter [name] is a 88 y.o. male here with his son for follow-up of chronic right shoulder pain.  Right shoulder pain with ultrasound confirmed complete tear of the supraspinatus, complete rupture of the long head of the bicep tendon with partial tear of the pectoralis major tendon.  At our last visit on 11/25/2021 we did proceed with ultrasound-guided subacromial joint injection.  Peter [name] states today that though usually this gives him good relief, this one did not help his pain as much nor as long.  We did prescribe the Nitropatch protocol which he did try for about 2 weeks, but then stopped this as he did not notice an acute difference in pain.  He did not have any side effects from this.  He admits that he has not been adherent to his home exercises which we prescribed last time.  He has been using Biofreeze at nighttime which does help control his pain.  His pain is throughout the day but certainly worse at night.  He has tried over-the-counter lidocaine patches and he and his son have tried Salonpas in the past with only some relief.   BP (!) 142/72   Ht '5\' 9"'$  (1.753 m)   Wt 188 lb (85.3 kg)   BMI 27.76 kg/m       View : No data to display.              View : No data to display.              Objective:  Physical Exam:  Gen: Well-appearing, in no acute distress; non-toxic CV: Regular Rate. Well-perfused. Warm.  Resp: Breathing unlabored on room air; no wheezing. Psych: Fluid speech in conversation; appropriate affect; normal thought process Neuro: Sensation intact throughout. No gross coordination deficits.  MSK:  - Right shoulder: Some generalized TTP throughout the shoulder although no specific bony tenderness.  Active range of motion of forward flexion to approximately 115 degrees but able to be taken further passively.  Active abduction to 100 degrees.  There is relatively well-preserved passive and active external range of motion.   Neurovascular intact distally.   Assessment & Plan:  1. Chronic right shoulder pain with known rotator cuff arthropathy 2.  Chronic complete tear of biceps tendon, long head right 3.  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  We discussed topical options for him.  He may continue with Biofreeze or IcyHot.  I did provide a list of other topical options he may try.  I will order prescription strength Lidoderm 5% patches as he has tried other over-the-counter options without significant relief.  We will start him back on a trial of the Nitropatch protocol, I did discuss with him this is to help increase blood flow and possible healing of his prior rotator cuff arthropathy, although did make him known that this would not significantly help with pain alone.  At some point in the future we may want to obtain x-ray of the right shoulder to evaluate for bony pathology as well.  Could consider glenohumeral joint injection although given his rotator cuff tear, the subacromial space and glenohumeral joint space communicate with one another. He will follow-up in about 2 months.  Elba Barman, DO PGY-4, Sports Medicine Fellow [place]  This note was dictated using Dragon naturally speaking software and may contain errors in syntax, spelling, or content which have not been identified prior to signing this note.   Addendum:  I was the preceptor for this visit and available for immediate consultation.  Karlton Lemon MD Kirt Boys

## 2022-01-20 ENCOUNTER — Other Ambulatory Visit: Payer: Medicare Other | Admitting: Sports Medicine

## 2022-01-26 ENCOUNTER — Other Ambulatory Visit: Payer: Self-pay | Admitting: Cardiovascular Disease

## 2022-01-27 ENCOUNTER — Other Ambulatory Visit: Payer: Medicare Other | Admitting: Sports Medicine

## 2022-01-31 ENCOUNTER — Other Ambulatory Visit: Payer: Self-pay | Admitting: Cardiovascular Disease

## 2022-02-10 ENCOUNTER — Encounter: Payer: Self-pay | Admitting: *Deleted

## 2022-02-10 DIAGNOSIS — H524 Presbyopia: Secondary | ICD-10-CM | POA: Diagnosis not present

## 2022-02-10 LAB — HM DIABETES EYE EXAM

## 2022-02-11 ENCOUNTER — Ambulatory Visit: Payer: Medicare Other | Admitting: Neurology

## 2022-02-11 ENCOUNTER — Encounter: Payer: Self-pay | Admitting: Family Medicine

## 2022-02-11 VITALS — Ht 69.0 in | Wt 188.0 lb

## 2022-02-11 DIAGNOSIS — G5131 Clonic hemifacial spasm, right: Secondary | ICD-10-CM | POA: Diagnosis not present

## 2022-02-11 NOTE — Progress Notes (Signed)
Xeomin 50 units x 1 vial  [id number] [id number] Exp-08/2022 B/B 

## 2022-02-11 NOTE — Progress Notes (Signed)
    PATIENT: Peter [name] DOB: December 06, 1929  Chief Complaint  Patient presents with   Procedure    Room 14 Xeomin 50 units, pt doing well with injections      HISTORICAL  Peter [name] is 88 years old right-handed male, accompanied by his wife, seen in refer by Dr. Brett Fairy for evaluation of EMG guided botulism toxin injection for right hemifacial spasm  I reviewed and summarized his medical history, he had a history of hypertension hyperlipidemia, bilateral cataract surgery, around 2015, he noticed right facial muscle spasm, initially mainly involving muscle around his right eye, over the past few years, gradually getting worse, more frequent, occasionally blocking his right vision, also spreading to right cheek, right upper mouth corner, is present 25% daytime, he also has bilateral senior ptosis, occasionally blocking upper vision, was suggested bilateral blepharoplasty by his ophthalmologist.   He denies a history of right facial palsy, no visual loss, no hearing loss,  He complains of chronic cough for few months, He also complains of chronic low back pain, knee pain, mild gait abnormality.  Update April 01 2016:  We have personally reviewed MRI of brain in July 2017, there is evidence of generalized atrophy, supratentorium small vessel disease, no acute abnormalities.  We reviewed that he has vascular risk factor of aging, hypertension, hyperlipidemia, he should take baby aspirin daily.  Today is his first EMG guided Xeomin injection for right hemifacial spasm, all the questions were answered, potential side effect was explained  UPDATE Jul 07 2016: He responded very well to previous EMG guided Xeomin injection in August 2017 for right hemifacial spasm, the same time he was evaluated by ophthalmologist, is planning on to have right blepharoplasty for redundant tissue at the right upper eyelid.  UPDATE March 09 2017: He had bilateral blepharoplasty, also tear duct  procedure, only recent few weeks, he noticed recurrent right eye, and cheek muscle twitching, last Xeomin injection was on July 07 7016.  UPDATE Jun 15 2017: He did very well with previous injection, there is no significant side effect noticed.  UPDATE Jun 19 2018: Last injection was in 2018, he did very well.  UPDATE Oct 03 2019: He responded very well to previous injection  UPDATE March 03 2021: He return for EMG guided Xeomin injection for right hemifacial spasm, previous injection works well for him  UPDATE Jun 03 2021: He did well with previous injection, return for EMG guided injection,  UPDATE November 10 2021: responded well to previous injection  Update February 11, 2022: He responded well to previous injection  PHYSICAL EXAMNIATION:   CN VII: He has occasional right hemifacial muscle spasm, mainly involving right orbicularis oculi, occasional involvement of right cheek, upper mouth area and right frontalis muscle   DIAGNOSTIC DATA (LABS, IMAGING, TESTING) - I reviewed patient records, labs, notes, testing and imaging myself where available.   ASSESSMENT AND PLAN  Peter [name] is a 88 y.o. male   Right hemifacial spasm  EMG guided xeomin injection, 50 units unit, used 17.5 units, discard 32.5 units  Right orbicularis oculi at  4, 6, 8, 9, 10 clock, 2.5 x5 units each= 12.5 units Left orbicularis oculi at 3, 4:00, 2.5 units each= 5 units       [name], M.D. Ph.D.  [facility] [address] [place], [place] [address] Ph: [phone] Fax: [phone]  CC: Rita Ohara, MD

## 2022-02-17 DIAGNOSIS — G5131 Clonic hemifacial spasm, right: Secondary | ICD-10-CM | POA: Diagnosis not present

## 2022-02-17 MED ORDER — INCOBOTULINUMTOXINA 50 UNITS IM SOLR
50.0000 [IU] | INTRAMUSCULAR | Status: DC
  Administered 2022-02-17: 50 [IU] via INTRAMUSCULAR

## 2022-02-22 ENCOUNTER — Ambulatory Visit (HOSPITAL_BASED_OUTPATIENT_CLINIC_OR_DEPARTMENT_OTHER): Payer: Medicare Other | Admitting: Cardiovascular Disease

## 2022-02-22 ENCOUNTER — Encounter (HOSPITAL_BASED_OUTPATIENT_CLINIC_OR_DEPARTMENT_OTHER): Payer: Self-pay | Admitting: Cardiovascular Disease

## 2022-02-22 VITALS — BP 148/56 | HR 82 | Ht 69.0 in | Wt 192.7 lb

## 2022-02-22 DIAGNOSIS — I6523 Occlusion and stenosis of bilateral carotid arteries: Secondary | ICD-10-CM

## 2022-02-22 DIAGNOSIS — I6529 Occlusion and stenosis of unspecified carotid artery: Secondary | ICD-10-CM | POA: Insufficient documentation

## 2022-02-22 DIAGNOSIS — I10 Essential (primary) hypertension: Secondary | ICD-10-CM

## 2022-02-22 DIAGNOSIS — M791 Myalgia, unspecified site: Secondary | ICD-10-CM

## 2022-02-22 DIAGNOSIS — R0789 Other chest pain: Secondary | ICD-10-CM

## 2022-02-22 DIAGNOSIS — E782 Mixed hyperlipidemia: Secondary | ICD-10-CM

## 2022-02-22 DIAGNOSIS — R5383 Other fatigue: Secondary | ICD-10-CM

## 2022-02-22 DIAGNOSIS — R079 Chest pain, unspecified: Secondary | ICD-10-CM | POA: Diagnosis not present

## 2022-02-22 DIAGNOSIS — I779 Disorder of arteries and arterioles, unspecified: Secondary | ICD-10-CM | POA: Insufficient documentation

## 2022-02-22 HISTORY — DX: Other fatigue: R53.83

## 2022-02-22 MED ORDER — CARVEDILOL PHOSPHATE ER 20 MG PO CP24: 20.0000 mg | ORAL_CAPSULE | Freq: Every day | ORAL | 1 refills | Status: DC

## 2022-02-22 NOTE — Assessment & Plan Note (Addendum)
Blood pressure is poorly controlled.  He has been on carvedilol and losartan.  His diastolic blood pressures have been quite low at times.  He notes that he did better on long-acting carvedilol.  He would like to try switching to 20 mg of carvedilol sustained-release daily.  Continue losartan.  He will keep checking his blood pressures and bring to follow-up.

## 2022-02-22 NOTE — Assessment & Plan Note (Signed)
Mr. Peter [name] reports chest pressure and tightness with exertion.  He does also have a hiatal hernia and it is unclear how this is contributing.  He has an intolerance to contrast dye where he has felt hot and flushed.  We will get a Lexiscan Myoview to evaluate for ischemia.  Otherwise continue primary prevention with aspirin and statin.

## 2022-02-22 NOTE — Assessment & Plan Note (Signed)
Patient reports fatigue that has been somewhat worse lately.  He also has muscle aches.  Thyroid function has been slightly off.  We will check a TSH, T3, and free T4.  We will also check an ESR.  Given his myalgias we will stop his atorvastatin for 2 weeks to see if this helps.  If it does not he will resume his statin.  If it does he will call let us know.  Recommended that he work on trying to increase his exercise even for 5 minutes at a time.

## 2022-02-22 NOTE — Assessment & Plan Note (Signed)
Lipids have been well-controlled on atorvastatin.  We are giving him a 2-week statin holiday as above.  LDL goal is less than 70 given his history of carotid stenosis.

## 2022-02-22 NOTE — Patient Instructions (Addendum)
Medication Instructions:  STOP CARVEDILOL TWICE A DAY   START CARVEDILOL CR 20 MG DAILY   *If you need a refill on your cardiac medications before your next appointment, please call your pharmacy*  Lab Work: TSH/T4/T3 SOON   If you have labs (blood work) drawn today and your tests are completely normal, you will receive your results only by: [place] (if you have MyChart) OR A paper copy in the mail If you have any lab test that is abnormal or we need to change your treatment, we will call you to review the results.  Testing/Procedures: Your physician has requested that you have a carotid duplex. This test is an ultrasound of the carotid arteries in your neck. It looks at blood flow through these arteries that supply the brain with blood. Allow one hour for this exam. There are no restrictions or special instructions.  Your physician has requested that you have a lexiscan myoview. For further information please visit [email or web address]. Please follow instruction sheet, as given.  Follow-Up: At [facility], you and your health needs are our priority.  As part of our continuing mission to provide you with exceptional heart care, we have created designated Provider Care Teams.  These Care Teams include your primary Cardiologist (physician) and Advanced Practice Providers (APPs -  Physician Assistants and Nurse Practitioners) who all work together to provide you with the care you need, when you need it.  We recommend signing up for the patient portal called "MyChart".  Sign up information is provided on this After Visit Summary.  MyChart is used to connect with patients for Virtual Visits (Telemedicine).  Patients are able to view lab/test results, encounter notes, upcoming appointments, etc.  Non-urgent messages can be sent to your provider as well.   To learn more about what you can do with MyChart, go to [email or web address].    Your next appointment:   2-3 month(s)  The  format for your next appointment:   In Person  Provider:   Skeet Latch, MD or Laurann Montana, NP{

## 2022-02-22 NOTE — Assessment & Plan Note (Signed)
He has a history of carotid stenosis but no recent imaging.  We will check carotid Dopplers.  Continue aspirin and statin.

## 2022-02-22 NOTE — Progress Notes (Signed)
Cardiology Office Note:    Date:  02/22/2022   ID:  Peter [name], DOB 10-Sep-1929, MRN [id number]  PCP:  Rita Ohara, MD   [place] Providers Cardiologist:  Mertie Moores, MD     Referring MD: Rita Ohara, MD   No chief complaint on file.   History of Present Illness:    Peter [name] is a 88 y.o. male with a hx of hypertension, diabetes mellitus, carotid stenosis, hypercholesterolemia and asthma. Here for follow up.  He has a history of mild to moderate carotid stenosis. Previously had nuclear stress test that was negative around the year 2007. He has struggled with his blood pressure and at his last cardiology appointment he was taking carvedilol once a day. This was changed to two twice daily.   Today, he is accompanied with his son, and says he has been feeling well. He has discomfort which he describes as a "constricted/restricted feeling" with exertion, which has occurred for the last 30 years.  He does not get much exercise.  He notes that he has a hiatal hernia and wonders if this is contributing.  His diastolic pressures are normally low, in the 10-17P and his systolic pressures average around 135-145 mmHg. His son says that after walking for a long period of time he has to sit down and rest. [name] adds that his energy levels have been lower this year. His son states that at baseline he was very active.  Lately he just does not have the energy.  He is struggled with muscle aches.  He has been on atorvastatin for decades.  The patient denies chest pain, nocturnal dyspnea, orthopnea or peripheral edema. There have been no palpitations, lightheadedness or syncope.   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 Carilyn Goodpasture      Current Medications: Current Meds  Medication Sig   albuterol (VENTOLIN HFA) 108 (90 Base) MCG/ACT inhaler INHALE 2 PUFFS EVERY 6 HOURS AS NEEDED FOR WHEEZING OR SHORTNESS OF BREATH.   ANORO ELLIPTA 62.5-25 MCG/INH AEPB INHALE 1 PUFF ONCE DAILY.   aspirin EC 81 MG tablet Take 81 mg by mouth daily.   atorvastatin (LIPITOR) 20 MG tablet TAKE ONE TABLET BY MOUTH EVERY DAY   carvedilol (COREG CR) 20 MG 24 hr capsule Take 1 capsule (20 mg total) by mouth daily.   COVID-19 mRNA bivalent vaccine, Moderna, (MODERNA COVID-19 BIVAL BOOSTER) 50 MCG/0.5ML injection Inject into the muscle.   fluticasone (FLONASE) 50 MCG/ACT nasal spray Place 2 sprays into both nostrils daily.   incobotulinumtoxinA (XEOMIN) 50 units SOLR injection Inject 50 Units into the muscle every 3 (three) months.   lidocaine (LIDODERM) 5 % Place 1 patch onto the  skin daily. Remove & Discard patch within 12 hours or as directed by MD   losartan (COZAAR) 100 MG tablet Take 1 tablet (100 mg total) by mouth daily.   nitroGLYCERIN (NITRODUR - DOSED IN MG/24 HR) 0.2 mg/hr patch Place 1 patch (0.2 mg total) onto the skin daily. Place 1/4th patch over painful area of shoulder. Do not take if using Viagra within 24 hours before or after.   [DISCONTINUED] carvedilol (COREG) 12.5 MG tablet Take 1 tablet (12.5 mg total) by mouth 2 (two) times daily.   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  Children live in [place], [place], [place], and 2 daughters and son here in [place], [place], [place]. 16 grandchildren, 11 great-grandchildren   Patient is right handed.  And drinks caffeine daily.   Social Determinants of Health   Financial Resource Strain: Not on file  Food Insecurity: Not on file  Transportation Needs: Not on file  Physical Activity: Not on file  Stress: Not on file  Social Connections: Not on file     Family History: The patient's family history includes Alcohol abuse in his father; Arthritis in his daughter and daughter; COPD in his brother; Cancer in his brother, sister, and sister; Colon cancer (age of onset: 69) in his brother; Diabetes in his maternal grandmother; Hyperlipidemia in his son and son. There is no history of Stroke or Heart disease.  ROS:   Please see the history of present illness.    (+) Discomfort (+) Low energy All other systems reviewed and are negative.  EKGs/Labs/Other Studies Reviewed:    The following studies were reviewed today:  EKG:  02/22/2022 EKG: Rate 82. Sinus  Rhythm.  Recent Labs: 04/27/2021: ALT 16; BUN 27; Creatinine, Ser 1.34; Hemoglobin 12.6; Platelets 255; Potassium 5.3; Sodium 140; TSH 5.070  Recent Lipid Panel    Component Value Date/Time   CHOL 142 04/27/2021 1354   TRIG 107 04/27/2021 1354   HDL 52 04/27/2021 1354   CHOLHDL 2.7 04/27/2021 1354   CHOLHDL 2.9 12/17/2016 0722   VLDL 22 12/17/2016 0722   LDLCALC 71 04/27/2021 1354     Physical Exam:    VS:  BP (!) 148/56 (BP Location: Right Arm, Patient Position: Sitting, Cuff Size: Normal)   Pulse 82   Ht $R'5\' 9"'TZ$  (1.753 m)   Wt 192 lb 11.2 oz (  87.4 kg)   BMI 28.46 kg/m  , BMI Body mass index is 28.46 kg/m. GENERAL:  Well appearing HEENT: Pupils equal round and reactive, fundi not visualized, oral mucosa unremarkable NECK:  No jugular venous distention, waveform within normal limits, carotid upstroke brisk and symmetric, no bruits, no thyromegaly LUNGS:  Clear to auscultation bilaterally HEART:  RRR.  PMI not displaced or sustained,S1 and S2 within normal limits, no S3, no S4, no clicks, no rubs, II/VI early-peaking systolic murmur at the left upper sternal border. ABD:  Flat, positive bowel sounds normal in frequency in pitch, no bruits, no rebound, no guarding, no midline pulsatile mass, no hepatomegaly, no splenomegaly EXT:  2 plus pulses throughout, no edema, no cyanosis no clubbing SKIN:  No rashes no nodules NEURO:  Cranial nerves II through XII grossly intact, motor grossly intact throughout PSYCH:  Cognitively intact, oriented to person place and time    ASSESSMENT:    1. Chest pain of uncertain etiology   2. Essential hypertension   3. Myalgia   4. Bilateral carotid artery stenosis   5. Fatigue, unspecified type   6. Mixed hyperlipidemia   7. Chest tightness    PLAN:    Essential hypertension Blood pressure is poorly controlled.  He has been on carvedilol and losartan.  His diastolic blood pressures have been quite low at times.  He notes that he did better  on long-acting carvedilol.  He would like to try switching to 20 mg of carvedilol sustained-release daily.  Continue losartan.  He will keep checking his blood pressures and bring to follow-up.  Fatigue Patient reports fatigue that has been somewhat worse lately.  He also has muscle aches.  Thyroid function has been slightly off.  We will check a TSH, T3, and free T4.  We will also check an ESR.  Given his myalgias we will stop his atorvastatin for 2 weeks to see if this helps.  If it does not he will resume his statin.  If it does he will call let us know.  Recommended that he work on trying to increase his exercise even for 5 minutes at a time.  Mixed hyperlipidemia Lipids have been well-controlled on atorvastatin.  We are giving him a 2-week statin holiday as above.  LDL goal is less than 70 given his history of carotid stenosis.  Carotid stenosis He has a history of carotid stenosis but no recent imaging.  We will check carotid Dopplers.  Continue aspirin and statin.  Chest tightness Mr. Amanda Cockayne reports chest pressure and tightness with exertion.  He does also have a hiatal hernia and it is unclear how this is contributing.  He has an intolerance to contrast dye where he has felt hot and flushed.  We will get a Lexiscan Myoview to evaluate for ischemia.  Otherwise continue primary prevention with aspirin and statin.     Shared Decision Making/Informed Consent The risks [chest pain, shortness of breath, cardiac arrhythmias, dizziness, blood pressure fluctuations, myocardial infarction, stroke/transient ischemic attack, nausea, vomiting, allergic reaction, radiation exposure, metallic taste sensation and life-threatening complications (estimated to be 1 in 10,000)], benefits (risk stratification, diagnosing coronary artery disease, treatment guidance) and alternatives of a nuclear stress test were discussed in detail with Mr. Thain and he agrees to proceed.  Disposition: Tula Schryver C. Oval Linsey, MD,  [facility] in 2 months.   Medication Adjustments/Labs and Tests Ordered: Current medicines are reviewed at length with the patient today.  Concerns regarding medicines are outlined above.  Orders Placed  This Encounter  Procedures   T4, free   TSH   T3   Sedimentation rate   MYOCARDIAL PERFUSION IMAGING   EKG 12-Lead   VAS US CAROTID   Meds ordered this encounter  Medications   carvedilol (COREG CR) 20 MG 24 hr capsule    Sig: Take 1 capsule (20 mg total) by mouth daily.    Dispense:  90 capsule    Refill:  1    D/C CARVEDILOL BID    Patient Instructions  Medication Instructions:  STOP CARVEDILOL TWICE A DAY   START CARVEDILOL CR 20 MG DAILY   *If you need a refill on your cardiac medications before your next appointment, please call your pharmacy*  Lab Work: TSH/T4/T3 SOON   If you have labs (blood work) drawn today and your tests are completely normal, you will receive your results only by: [place] (if you have MyChart) OR A paper copy in the mail If you have any lab test that is abnormal or we need to change your treatment, we will call you to review the results.  Testing/Procedures: Your physician has requested that you have a carotid duplex. This test is an ultrasound of the carotid arteries in your neck. It looks at blood flow through these arteries that supply the brain with blood. Allow one hour for this exam. There are no restrictions or special instructions.  Your physician has requested that you have a lexiscan myoview. For further information please visit [email or web address]. Please follow instruction sheet, as given.  Follow-Up: At [facility], you and your health needs are our priority.  As part of our continuing mission to provide you with exceptional heart care, we have created designated Provider Care Teams.  These Care Teams include your primary Cardiologist (physician) and Advanced Practice Providers (APPs -  Physician Assistants and Nurse  Practitioners) who all work together to provide you with the care you need, when you need it.  We recommend signing up for the patient portal called "MyChart".  Sign up information is provided on this After Visit Summary.  MyChart is used to connect with patients for Virtual Visits (Telemedicine).  Patients are able to view lab/test results, encounter notes, upcoming appointments, etc.  Non-urgent messages can be sent to your provider as well.   To learn more about what you can do with MyChart, go to [email or web address].    Your next appointment:   2-3 month(s)  The format for your next appointment:   In Person  Provider:   Skeet Latch, MD or Laurann [place], NP{               I,Tinashe Williams,acting as a scribe for Skeet Latch, MD.,have documented all relevant documentation on the behalf of Skeet Latch, MD,as directed by  Skeet Latch, MD while in the presence of Skeet Latch, MD.   I, [place] Oval Linsey, MD have reviewed all documentation for this visit.  The documentation of the exam, diagnosis, procedures, and orders on 02/22/2022 are all accurate and complete.

## 2022-02-23 DIAGNOSIS — M791 Myalgia, unspecified site: Secondary | ICD-10-CM | POA: Diagnosis not present

## 2022-02-23 DIAGNOSIS — I10 Essential (primary) hypertension: Secondary | ICD-10-CM | POA: Diagnosis not present

## 2022-02-23 DIAGNOSIS — M545 Low back pain, unspecified: Secondary | ICD-10-CM | POA: Diagnosis not present

## 2022-02-24 LAB — T4, FREE: Free T4: 0.93 ng/dL (ref 0.82–1.77)

## 2022-02-24 LAB — SEDIMENTATION RATE: Sed Rate: 2 mm/hr (ref 0–30)

## 2022-02-24 LAB — T3: T3, Total: 99 ng/dL (ref 71–180)

## 2022-02-24 LAB — TSH: TSH: 4.51 u[IU]/mL — ABNORMAL HIGH (ref 0.450–4.500)

## 2022-02-25 ENCOUNTER — Telehealth (HOSPITAL_COMMUNITY): Payer: Self-pay | Admitting: *Deleted

## 2022-02-25 NOTE — Telephone Encounter (Signed)
Close encounter 

## 2022-02-26 ENCOUNTER — Ambulatory Visit (HOSPITAL_COMMUNITY)
Admission: RE | Admit: 2022-02-26 | Discharge: 2022-02-26 | Disposition: A | Discharge status: Home / Self Care | Payer: Medicare Other | Source: Ambulatory Visit | Attending: Cardiovascular Disease | Admitting: Cardiovascular Disease

## 2022-02-26 DIAGNOSIS — I10 Essential (primary) hypertension: Secondary | ICD-10-CM

## 2022-02-26 DIAGNOSIS — R079 Chest pain, unspecified: Secondary | ICD-10-CM | POA: Diagnosis not present

## 2022-02-26 LAB — MYOCARDIAL PERFUSION IMAGING
Base ST Depression (mm): 0 mm
LV dias vol: 109 mL (ref 62–150)
LV sys vol: 42 mL
Nuc Stress EF: 62 %
Peak HR: 82 {beats}/min
Rest HR: 66 {beats}/min
Rest Nuclear Isotope Dose: 11 mCi
SDS: 0
SRS: 0
SSS: 0
ST Depression (mm): 0 mm
Stress Nuclear Isotope Dose: 30.8 mCi
TID: 0.94

## 2022-02-26 MED ORDER — REGADENOSON 0.4 MG/5ML IV SOLN
0.4000 mg | Freq: Once | INTRAVENOUS | Status: AC
  Administered 2022-02-26: 0.4 mg via INTRAVENOUS

## 2022-02-26 MED ORDER — TECHNETIUM TC 99M TETROFOSMIN IV KIT
11.0000 | PACK | Freq: Once | INTRAVENOUS | Status: AC | PRN
  Administered 2022-02-26: 11 via INTRAVENOUS

## 2022-02-26 MED ORDER — TECHNETIUM TC 99M TETROFOSMIN IV KIT
30.8000 | PACK | Freq: Once | INTRAVENOUS | Status: AC | PRN
  Administered 2022-02-26: 30.8 via INTRAVENOUS

## 2022-03-01 ENCOUNTER — Encounter (HOSPITAL_BASED_OUTPATIENT_CLINIC_OR_DEPARTMENT_OTHER): Payer: Medicare Other

## 2022-03-05 DIAGNOSIS — L821 Other seborrheic keratosis: Secondary | ICD-10-CM | POA: Diagnosis not present

## 2022-03-05 DIAGNOSIS — Z85828 Personal history of other malignant neoplasm of skin: Secondary | ICD-10-CM | POA: Diagnosis not present

## 2022-03-05 DIAGNOSIS — L570 Actinic keratosis: Secondary | ICD-10-CM | POA: Diagnosis not present

## 2022-03-05 DIAGNOSIS — L820 Inflamed seborrheic keratosis: Secondary | ICD-10-CM | POA: Diagnosis not present

## 2022-03-08 ENCOUNTER — Other Ambulatory Visit: Payer: Self-pay | Admitting: Family Medicine

## 2022-03-08 DIAGNOSIS — J441 Chronic obstructive pulmonary disease with (acute) exacerbation: Secondary | ICD-10-CM

## 2022-03-08 NOTE — Telephone Encounter (Signed)
Is this okay to refill? 

## 2022-03-09 ENCOUNTER — Encounter (HOSPITAL_BASED_OUTPATIENT_CLINIC_OR_DEPARTMENT_OTHER): Payer: Medicare Other

## 2022-03-09 ENCOUNTER — Ambulatory Visit (INDEPENDENT_AMBULATORY_CARE_PROVIDER_SITE_OTHER): Payer: Medicare Other

## 2022-03-09 DIAGNOSIS — I6523 Occlusion and stenosis of bilateral carotid arteries: Secondary | ICD-10-CM

## 2022-03-09 DIAGNOSIS — M545 Low back pain, unspecified: Secondary | ICD-10-CM | POA: Diagnosis not present

## 2022-03-12 ENCOUNTER — Ambulatory Visit (HOSPITAL_BASED_OUTPATIENT_CLINIC_OR_DEPARTMENT_OTHER): Payer: Medicare Other | Admitting: Cardiovascular Disease

## 2022-03-16 ENCOUNTER — Encounter (HOSPITAL_BASED_OUTPATIENT_CLINIC_OR_DEPARTMENT_OTHER): Payer: Self-pay | Admitting: Cardiovascular Disease

## 2022-03-17 ENCOUNTER — Other Ambulatory Visit (HOSPITAL_BASED_OUTPATIENT_CLINIC_OR_DEPARTMENT_OTHER): Payer: Self-pay | Admitting: *Deleted

## 2022-03-17 DIAGNOSIS — I6523 Occlusion and stenosis of bilateral carotid arteries: Secondary | ICD-10-CM

## 2022-03-17 NOTE — Telephone Encounter (Signed)
FYI for when results come back

## 2022-03-22 ENCOUNTER — Telehealth (HOSPITAL_BASED_OUTPATIENT_CLINIC_OR_DEPARTMENT_OTHER): Payer: Self-pay

## 2022-03-22 NOTE — Telephone Encounter (Signed)
Submitted Prior Auth through [employer] [employer] today.  Peter [name] (Key: [id number])  Your information has been submitted to [place]. [employer] [place] will review the request and notify you of the determination decision directly, typically within 3 business days of your submission and once all necessary information is received.  You will also receive your request decision electronically. To check for an update later, open the request again from your dashboard.  If [employer] has not responded within the specified timeframe or if you have any questions about your PA submission, contact [place] directly at [facility]) [phone] or ([place]) [phone].

## 2022-03-23 ENCOUNTER — Telehealth: Payer: Self-pay | Admitting: Cardiovascular Disease

## 2022-03-23 NOTE — Telephone Encounter (Signed)
Message received the  PA for carvedilol has been approved from 03/22/22-03/23/23. [employer] is calling to confirm the PA for carvedilol has been approved from 03/22/22-03/23/23. Patient's son contacted.  Call to [facility] to notify them of authorization. [place] states they are unable to get the medication as ordered.  They are going to contact patient. Georgana Curio MHA RN CCM

## 2022-03-23 NOTE — Telephone Encounter (Signed)
[employer] is calling to confirm the PA for carvedilol has been approved from 03/22/22-03/23/23.

## 2022-03-31 ENCOUNTER — Ambulatory Visit: Payer: Medicare Other | Admitting: Sports Medicine

## 2022-03-31 ENCOUNTER — Ambulatory Visit (INDEPENDENT_AMBULATORY_CARE_PROVIDER_SITE_OTHER): Payer: Medicare Other

## 2022-03-31 ENCOUNTER — Encounter: Payer: Self-pay | Admitting: Sports Medicine

## 2022-03-31 ENCOUNTER — Ambulatory Visit: Payer: Self-pay

## 2022-03-31 VITALS — BP 148/69 | HR 65 | Resp 16 | Ht 69.0 in | Wt 192.0 lb

## 2022-03-31 DIAGNOSIS — M25511 Pain in right shoulder: Secondary | ICD-10-CM

## 2022-03-31 DIAGNOSIS — M75101 Unspecified rotator cuff tear or rupture of right shoulder, not specified as traumatic: Secondary | ICD-10-CM

## 2022-03-31 DIAGNOSIS — G8929 Other chronic pain: Secondary | ICD-10-CM

## 2022-03-31 DIAGNOSIS — M6289 Other specified disorders of muscle: Secondary | ICD-10-CM

## 2022-03-31 DIAGNOSIS — M12811 Other specific arthropathies, not elsewhere classified, right shoulder: Secondary | ICD-10-CM

## 2022-03-31 MED ORDER — CELECOXIB 100 MG PO CAPS: 100.0000 mg | ORAL_CAPSULE | Freq: Two times a day (BID) | ORAL | 1 refills | Status: DC

## 2022-03-31 NOTE — Progress Notes (Signed)
Office Visit Note   Patient: Peter [name]           Date of Birth: April 11, 1930           MRN: [id number] Visit Date: 03/31/2022              Requested by: Peter [name], [name],  [place] [id number] PCP: Peter Ohara, MD  Assessment & Plan: Visit Diagnoses:  1. Chronic right shoulder pain   2. Rotator cuff tear arthropathy of right shoulder   3. Muscle fatigue   4. Tear of right supraspinatus tendon    Plan: Did obtain x-rays of the right shoulder today which do show mild glenohumeral joint osteoarthritis, however discussed that I still believe his shoulder pain is more so coming from his torn rotator cuff tendons with recent exacerbation with certain movements.Through shared decision making elected to proceed with ultrasound-guided subacromial joint injection, patient tolerated well.  If he gets decent relief from this, we can always consider repeating this as he is not interested in surgical management at this time, which I feel is a smart decision.  If he gets no relief from the injection, we will bring him back in about 1 month and consider intra-articular glenohumeral joint injection to see if this does in fact improve his pain.  We we will do a short trial of Celebrex as needed to see if this does help improve his muscular pain.  Did recommend tart cherry juice at nighttime to help with muscle fatigue and recovery as well.  He and his son will keep me updated on his improvement with these modalities.  Follow-Up Instructions: Follow-up in 2-3 months if injection helped; if no pain relief, we can consider GHJ injection in 1 month.   Orders:  Orders Placed This Encounter  Procedures   XR Shoulder Right   [place] Extrem Up Right Ltd   Meds ordered this encounter  Medications   celecoxib (CELEBREX) 100 MG capsule    Sig: Take 1 capsule (100 mg total) by mouth 2 (two) times daily.    Dispense:  30 capsule    Refill:  1      Procedures:  Procedure:  Subacromial Injection, right Shoulder  After discussion on risks/benefits/indications, informed verbal consent was obtained. A timeout was then performed. Patient was seated in chair in exam room. The patient's right shoulder was prepped with betadine and alcohol swabs and utilizing lateral approach with ultrasound guidance, the patient's subacromial space was injected with 4:2 mixture of lidocaine:betamethasone via an in-plane approach. Patient tolerated the procedure well without immediate complications.     Clinical Data: No additional findings.   Subjective: Chief Complaint  Patient presents with   Right Shoulder - Pain    HPI Peter [name] is a very pleasant 88 year-old male who presents for acute on chronic left shoulder pain. I have seen him in the past at the [name]. He has a known history of ultrasound confirmed complete tear of the supraspinatus, as well as rupture of the long head of the bicep tendon and partial tear of the pectoralis major tendon.  He is not wishing to pursue surgical fixation and has gotten by thus far on injection therapy and rehab.  Today, he reports the right shoulder continues to bother him at times, although he is still getting by with day-to-day activities.  Recently with certain reaching motions or when sleeping on it at night the pain has become exacerbated.  They did try compression  sleeve of the shoulder as well as of various assortment of topical patches without much relief.  Occasionally will take acetaminophen, he and his son are inquiring about Celebrex.   Objective: Vital Signs: BP (!) 148/69 (BP Location: Left Arm, Patient Position: Sitting, Cuff Size: Normal)   Pulse 65   Resp 16   Ht '5\' 9"'$  (1.753 m)   Wt 192 lb (87.1 kg)   BMI 28.35 kg/m   Physical Exam Gen: Well-appearing, in no acute distress; non-toxic CV: Regular Rate. Well-perfused. Warm.  Resp: Breathing unlabored on room air; no wheezing. Psych: Fluid speech in conversation;  appropriate affect; normal thought process Neuro: Sensation intact throughout. No gross coordination deficits.    Ortho Exam  - Right shoulder: Inspection yields no erythema, ecchymosis or swelling.  There is no specific bony TTP.  Patient has limited active range of motion with forward flexion to 60 degrees and abduction to 50 degrees.  I am able to take him through full range of motion actively.  There is some pain at endrange external rotation actively.  Negative impingement sign.  Baseline weakness with resisted abduction and mild external rotation.  Radial pulse intact.  Imaging: XR Shoulder Right  Result Date: 03/31/2022 3 views of the right shoulder including Grashey, scapular Y, and axial views were obtained and reviewed by myself.  X-rays demonstrate mild glenohumeral joint arthritis as well as at least moderate AC joint arthropathy.  Humeral head is well-seated with good position.  No other acute bony abnormalities noted.    PMFS History: Patient Active Problem List   Diagnosis Date Noted   Fatigue 02/22/2022   Carotid stenosis 02/22/2022   Acute pain of right foot 09/17/2021   Traumatic partial tear of right biceps tendon 06/30/2021   Rupture of pectoralis major muscle 06/30/2021   Greater trochanteric pain syndrome 01/24/2018   Hemiparesis affecting right side as late effect of cerebrovascular accident ([name]) 06/15/2017   Mixed hyperlipidemia 03/30/2017   Rotator cuff tear arthropathy of right shoulder 04/14/2016   Hemifacial spasm 02/12/2016   Abnormality of gait 02/12/2016   Increased prostate specific antigen (PSA) velocity 01/07/2016   Type 2 diabetes mellitus without complication ([name]) 02/06/1600   Full thickness rotator cuff tear, Left Shoulder 12/03/2014   Injury of left shoulder 08/22/2014   Facial nerve spasm 08/13/2014   Restlessness and agitation 08/13/2014   Allergic rhinitis 11/22/2012   Chest tightness 11/07/2012   Type II or unspecified type diabetes  mellitus without mention of complication, not stated as uncontrolled 01/21/2011   Pure hypercholesterolemia 01/21/2011   Essential hypertension 01/21/2011   Erectile dysfunction 01/21/2011   Past Medical History:  Diagnosis Date   Adenomatous colon polyp 07/2013   Asthma h/o as a child-s/p immunotherapy   Diabetes mellitus diet controlled   Facial nerve spasm 08/13/2014   Fatigue 02/22/2022   FHx: colon cancer    Hiatal hernia    Hypercholesteremia    Hypertension age 80   Internal hemorrhoids 07/2013   noted on colonoscopy   Restlessness and agitation 08/13/2014   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 09/18/2019   Dr.Whitfield    Family History  Problem Relation Age of Onset   Alcohol abuse Father    Cancer Sister        stomach/?pancreatic   Colon cancer Brother 35   Cancer Brother        colon cancer in mid 27's   Arthritis Daughter  psoriatic   Hyperlipidemia Son    Arthritis Daughter        psoriatic   Hyperlipidemia Son    Diabetes Maternal Grandmother    Cancer Sister        lung and brain   COPD Brother    Stroke Neg Hx    Heart disease Neg Hx     Past Surgical History:  Procedure Laterality Date   APPENDECTOMY     BLEPHAROPLASTY Bilateral 12/13/2016   CARDIOVASCULAR STRESS TEST  normal   CATARACT EXTRACTION, BILATERAL  07/2014, 08/2014   COLONOSCOPY  3/08, 07/2013   no repeat rec due to age per Dr. 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)  Tobacco Use   Smoking status: Former    Types: Cigarettes    Quit date: 08/09/1974    Years since quitting: 47.6   Smokeless tobacco: Never  Vaping Use   Vaping Use: Never used  Substance and Sexual Activity   Alcohol use: Yes    Alcohol/week: 8.0 standard drinks of alcohol    Types: 8 Cans of beer per week    Comment: 8 beers/week (no longer having gin and tonic or red wine)   Drug use: No   Sexual activity: Yes     Partners: Female   Elba Barman, DO [place]  This note was dictated using Dragon naturally speaking software and may contain errors in syntax, spelling, or content which have not been identified prior to signing this note.

## 2022-04-08 NOTE — Telephone Encounter (Signed)
See 8/15 phone note, approved

## 2022-04-13 ENCOUNTER — Other Ambulatory Visit: Payer: Medicare Other

## 2022-04-13 ENCOUNTER — Other Ambulatory Visit: Payer: Self-pay | Admitting: Family Medicine

## 2022-04-13 DIAGNOSIS — E7800 Pure hypercholesterolemia, unspecified: Secondary | ICD-10-CM | POA: Diagnosis not present

## 2022-04-13 DIAGNOSIS — Z5181 Encounter for therapeutic drug level monitoring: Secondary | ICD-10-CM

## 2022-04-13 DIAGNOSIS — E118 Type 2 diabetes mellitus with unspecified complications: Secondary | ICD-10-CM

## 2022-04-13 NOTE — Progress Notes (Signed)
Chief Complaint  Patient presents with   Annual Exam    Fasting annual exam, no concerns. He has not gotten Tdap at pharmacy yet. He still has plenty of atorvastatin left-looked at bottle. He stopped to see if pain in shoulder was from this medication but resumed when he started taking celebrex.     Peter [name] is a 88 y.o. male who presents for a complete physical.  See below for labs done yesterday.  Diabetes--this has always been diet controlled, never took meds.  He doesn't monitor blood sugars. He really doesn't watch his diet too carefully, but was asked to be more careful when his A1c had gone up to 6.8% in 04/2021. Last A1c was 6.6% in 07/2021. He reports today going on a diet, saw that he was "obese" on a weight chart.  In the last week, he hasn't had any alcohol, cut out carbs/bread/sugar (following a [facility] diet)--having 2 eggs and 2 pieces of bacon every morning).  He plans to do this for just a few weeks to lose weight, then continue with a low carb diet instead.  Denies polydipsia or polyuria. No retinopathy on diabetic eye exam done 02/2022. He checks his feet regularly and denies concerns.   Pt sees neuro for R hemifacial spasm, controlled with Xeomin injections q 3 months.  Last injection was in July. He thinks he will skip the October one, may not need it.  He had been under the care of Dr. Acie Fredrickson for HTN, hyperlipidemia, elevated coronary calcium and mild carotid artery disease.  He saw Dr. Oval Linsey in 02/2022 instead. Systolic BP was elevated, diastolic was low, taking carvedilol (BID) and losartan. He had reported doing better on long-acting carvedilol, so he was switched to 20 mg of carvedilol sustained-release daily, along with his losartan. BP's have been running 118-161/44-62.  Mostly averaging about 130/60. Pulse 62-85. If he stands up quickly he feels dizzy (same x 5 years).  BP Readings from Last 3 Encounters:  04/14/22 (!) 150/70  03/31/22 (!) 148/69   02/22/22 (!) 148/56    He had reported to Dr. Oval Linsey increased fatigue, and had noted a constricting feeling with exertion (walking up the hill of the driveway), so he underwent a stress test which was negative. He sometimes gets fatigued walking from 1 room to the other, and other times he is fine. Thyroid studies were repeated, and were improved (has had TSH 5-7 in the last few years, asymptomatic). Lab Results  Component Value Date   TSH 4.510 (H) 02/23/2022   He had previously reported a discomfort in the chest which was relieved by Tums, related to his hiatal hernia, and that is less often than in the past (avoids triggering foods). This occurs after spicy foods, alcohol, tomatoes. He has been avoiding these foods and doing much better.  He also has mild carotid artery disease. He underwent repeat carotid US last month, which showed 1-39% stenosis bilaterally.  He was advised to continue aspirin and atorvastatin, and plan is to repeat carotid Dopplers in 1 year. He denies any neurologic symptoms, other than mild balance problems, which have remained stable.   Lipids have been at goal on current regimen, LDL 71, HDL 52 in 04/2021.  See below for more recent labs.  He had reported some myalgias to Dr. Oval Linsey, so was given a 2 week holiday to see if muscle pain improved.  He didn't notice any difference being off the statin (stopped for 3 weeks).  He restarted  it about a week ago (when he started the celebrex).   BP Readings from Last 3 Encounters:  04/14/22 (!) 150/70  03/31/22 (!) 148/69  02/22/22 (!) 148/56    He has COPD/asthma and uses Anoro daily (at least 5x/week). Since taking the Anoro more regularly, he hasn't needed any albuterol in a long time.  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    Allergies are controlled with flonase daily, and antihistamines prn. He hasn't needed any antihistamines in a while. No longer wakes up  with a stuffy nose.   R shoulder pain, chronic:  He last saw ortho 8/23, and had an US-guided subacromial joint injection.  Xrays showed mild glenohumeral joint OA, but it was felt that the pain was more likely coming from his known torn RC tendons.  He was given a "short trial of Celebrex as needed", and recommended tart cherry juice at night to help with muscle fatigue and recovery. He was prescribed 134m BID #30 with 1 RF of Celebrex by ortho. He has been taking Celebrex just once daily. He has been sporadic with the cherry juice, not using every night.   Immunization History  Administered Date(s) Administered   Fluad Quad(high Dose 65+) 05/29/2019, 04/09/2020, 04/27/2021   Influenza Split 05/06/2011, 05/25/2012   Influenza, High Dose Seasonal PF 05/21/2013, 05/22/2014, 06/25/2015, 04/15/2016, 05/04/2017, 07/19/2018   Moderna Covid-19 Vaccine Bivalent Booster 113yr& up 09/07/2021   Moderna SARS-COV2 Booster Vaccination 06/19/2020   Moderna Sars-Covid-2 Vaccination 08/23/2019, 09/20/2019   Pneumococcal Conjugate-13 11/28/2013   Pneumococcal Polysaccharide-23 08/09/2006, 04/15/2016   Tdap 05/06/2011   Zoster Recombinat (Shingrix) 11/21/2019, 03/11/2020   Zoster, Live 06/01/2012   Last colonoscopy: 07/2013, Dr. EdOletta Lamastubular adenoma Last PSA:  2.4 in 12/2016 Dentist: twice yearly   Ophtho: regularly Sees dermatologist twice yearly Exercise: walking the dog, 10 minutes 3/day. Goes to the gym for "body structure manipulation" once a week. No longer using his bike. Does some yardwork.  PHQ-2 0 Fall screen:  tripped once when he got out of bed, no injury. Functional assessment notable only for some blurry vision and some urinary frequency, no incontinence.  He has a living will and healthcare power of attorney, scanned     PMH, PSH, SH and FH were reviewed and updated  Outpatient Encounter Medications as of 04/14/2022  Medication Sig Note   ANORO ELLIPTA 62.5-25 MCG/ACT AEPB  INHALE 1 PUFF ONCE DAILY. 04/14/2022: 5 days a week   carvedilol (COREG CR) 20 MG 24 hr capsule Take 1 capsule (20 mg total) by mouth daily.    celecoxib (CELEBREX) 100 MG capsule Take 1 capsule (100 mg total) by mouth 2 (two) times daily. 04/14/2022: Taking just once daily   fluticasone (FLONASE) 50 MCG/ACT nasal spray Place 2 sprays into both nostrils daily.    incobotulinumtoxinA (XEOMIN) 50 units SOLR injection Inject 50 Units into the muscle every 3 (three) months.    losartan (COZAAR) 100 MG tablet Take 1 tablet (100 mg total) by mouth daily.    albuterol (VENTOLIN HFA) 108 (90 Base) MCG/ACT inhaler INHALE 2 PUFFS EVERY 6 HOURS AS NEEDED FOR WHEEZING OR SHORTNESS OF BREATH. (Patient not taking: Reported on 04/14/2022) 04/14/2022: prn   aspirin EC 81 MG tablet Take 81 mg by mouth daily. (Patient not taking: Reported on 04/14/2022) 04/14/2022: Stopped temporarily while on celebrex   atorvastatin (LIPITOR) 20 MG tablet TAKE ONE TABLET BY MOUTH EVERY DAY (Patient not taking: Reported on  04/14/2022) 04/14/2022: Stopped for a month, resumed a week ago   [DISCONTINUED] COVID-19 mRNA bivalent vaccine, Moderna, (MODERNA COVID-19 BIVAL BOOSTER) 50 MCG/0.5ML injection Inject into the muscle.    [DISCONTINUED] lidocaine (LIDODERM) 5 % Place 1 patch onto the skin daily. Remove & Discard patch within 12 hours or as directed by MD    [DISCONTINUED] nitroGLYCERIN (NITRODUR - DOSED IN MG/24 HR) 0.2 mg/hr patch Place 1 patch (0.2 mg total) onto the skin daily. Place 1/4th patch over painful area of shoulder. Do not take if using Viagra within 24 hours before or after.    Facility-Administered Encounter Medications as of 04/14/2022  Medication   incobotulinumtoxinA (XEOMIN) 50 units injection 50 Units   Allergies  Allergen Reactions   Iodine Other (See Comments)    Feels like he is "burning up"    ROS: The patient denies anorexia, fever, headaches, ear pain, exertional chest pain, palpitations, syncope, dyspnea on exertion,  swelling, nausea, vomiting, diarrhea, constipation, abdominal pain, melena, hematochezia, hematuria, incontinence, weakened urine stream, dysuria, genital lesions, numbness, tingling, weakness, tremor, suspicious skin lesions, depression, anxiety, abnormal bleeding/bruising, or enlarged lymph nodes. No insomnia +stable hearing loss.   Some dizziness when he first stands up, unchanged, and some chronic balance issues. Nocturia x 3, unchanged. Rare indigestion, r/b Tums, after triggering foods. Rare. Sees dermatologist regularly (twice yearly) Hemifacial spasm improved with botox injections Fatigue--chronic, unchanged Breathing is stable.  Some R shoulder pain, somewhat improved from recent injection and celebrex.  Denies other joint pains. Intentional weight loss    PHYSICAL EXAM:  BP (!) 150/70   Pulse 84   Ht 5' 7" (1.702 m)   Wt 183 lb 9.6 oz (83.3 kg)   BMI 28.76 kg/m   154/62 on repeat by MD  Wt Readings from Last 3 Encounters:  04/14/22 183 lb 9.6 oz (83.3 kg)  03/31/22 192 lb (87.1 kg)  02/26/22 192 lb (87.1 kg)    General Appearance:    Alert, cooperative, no distress, appears stated age.   Head:    Normocephalic, without obvious abnormality, atraumatic     Eyes:    PERRL, conjunctiva/corneas clear, EOM's intact, fundi benign     Ears:    Normal TM's and external ear canals.  Nose:    Normal, no drainage or sinus tenderness  Throat:    Normal, no lesions  Neck:    Supple, no lymphadenopathy; thyroid: no enlargement/ tenderness/nodules; no carotid bruit or JVD     Back:    Spine nontender, no curvature, ROM normal, no CVA tenderness.  Lungs:    Clear to auscultation bilaterally without wheezes, rales or ronchi; respirations unlabored     Chest Wall:    No tenderness or deformity     Heart:    Regular rate and rhythm, S1 and S2 normal, no rub or gallop. 2/6 SEM noted at RUSB  Breast Exam:    No chest wall tenderness, masses or gynecomastia     Abdomen:    Soft,  non-tender, nondistended, normoactive bowel sounds, no masses, no hepatosplenomegaly     Genitalia:    Normal male external genitalia without lesions. Testicles without masses. No inguinal hernias.     Rectal:    Normal sphincter tone, no masses or tenderness; guaiac negative stool. Prostate smooth, no nodules, not enlarged.     Extremities:    No clubbing, cyanosis, trace edema at the left ankle  Pulses:    2+ and symmetric all extremities     Skin:  Skin color, texture, turgor normal, no rashes or lesions.    Lymph nodes:    Cervical, supraclavicular, and axillary nodes normal     Neurologic:    Normal strength, sensation and gait; reflexes 1+ and symmetric throughout.  No facial twitching noted, but there was some asymmetry, with slight facial droop on the right (no change per pt)          Psych:    Normal mood, affect, hygiene and grooming   Diabetic foot exam--decreased sensation to monofilament at 2-3rd metatarsal heads. Some discolored toenails  Lab Results  Component Value Date   HGBA1C 7.0 (H) 04/13/2022   Fasting glucose 126    Chemistry      Component Value Date/Time   NA 138 04/13/2022 1114   K 5.2 04/13/2022 1114   CL 100 04/13/2022 1114   CO2 18 (L) 04/13/2022 1114   BUN 41 (H) 04/13/2022 1114   CREATININE 1.68 (H) 04/13/2022 1114   CREATININE 1.23 (H) 12/17/2016 0722      Component Value Date/Time   CALCIUM 9.3 04/13/2022 1114   ALKPHOS 66 04/13/2022 1114   AST 23 04/13/2022 1114   ALT 19 04/13/2022 1114   BILITOT 0.9 04/13/2022 1114     Lab Results  Component Value Date   WBC 9.2 04/13/2022   HGB 12.5 (L) 04/13/2022   HCT 38.0 04/13/2022   MCV 99 (H) 04/13/2022   PLT 235 04/13/2022   Lab Results  Component Value Date   CHOL 152 04/13/2022   HDL 54 04/13/2022   LDLCALC 82 04/13/2022   TRIG 83 04/13/2022   CHOLHDL 2.8 04/13/2022     ASSESSMENT/PLAN:  Annual physical exam - Plan: Fecal occult blood, imunochemical([facility])  Controlled  type 2 diabetes mellitus with complication, without long-term current use of insulin ([facility]) - A1c higher than previously. Reviewed diet, exercise, weight loss. If remains 7, will need meds; trial of diet for now  Hyperlipidemia associated with type 2 diabetes mellitus ([place]) - cont statin  Hypertension associated with diabetes ([place]) - BP elevated in office, better at home. Discussed low Na diet, exercise, wt loss. Cont current meds per cardiology  Bilateral carotid artery disease, unspecified type ([place]) - very mild. cont statin, ASA  Chronic obstructive pulmonary disease, unspecified COPD type ([place]) - doing well on Anoro  Allergic rhinitis, unspecified seasonality, unspecified trigger - controlled with Flonase  Elevated serum creatinine - suspect related to use of NSAIDs from ortho. To stop celebrex and recheck. Encouraged adequate hydration as well - Plan: Basic metabolic panel  Medication monitoring encounter - Plan: Basic metabolic panel  Stage 3 chronic kidney disease, unspecified whether stage 3a or 3b CKD (HCC)  PSA not recommended based on age, recommended at least 30 minutes of aerobic activity at least 5 days/week, weight-bearing exercise at least 2x/week; proper sunscreen use reviewed; healthy diet and alcohol recommendations (less than or equal to 2 drinks/day) reviewed; regular seatbelt use; changing batteries in smoke detectors. Immunization recommendations discussed--continue yearly high dose flu shots. Updated COVID booster recommended when available.  RSV vaccine recommended from pharmacy.Tdap booster past due to get from pharmacy, reminded. Counseled re: timing of vaccines, to separate by 2 weeks.  Colonoscopy recommendations reviewed; was due in 2019, but due to age and lack of sx, had been checking yearly FIT testing.  If positive, or if anemia, refer back to GI.    F/u 6 mos for AWV/med check, CPE 1 year. B-met in 3-4 weeks to f/u on Cr

## 2022-04-13 NOTE — Patient Instructions (Incomplete)
  HEALTH MAINTENANCE RECOMMENDATIONS:  It is recommended that you get at least 30 minutes of aerobic exercise at least 5 days/week (for weight loss, you may need as much as 60-90 minutes). This can be any activity that gets your heart rate up. This can be divided in 10-15 minute intervals if needed, but try and build up your endurance at least once a week.  Weight bearing exercise is also recommended twice weekly.  Eat a healthy diet with lots of vegetables, fruits and fiber.  "Colorful" foods have a lot of vitamins (ie green vegetables, tomatoes, red peppers, etc).  Limit sweet tea, regular sodas and alcoholic beverages, all of which has a lot of calories and sugar.  Up to 2 alcoholic drinks daily may be beneficial for men (unless trying to lose weight, watch sugars).  Drink a lot of water.  Sunscreen of at least SPF 30 should be used on all sun-exposed parts of the skin when outside between the hours of 10 am and 4 pm (not just when at beach or pool, but even with exercise, golf, tennis, and yard work!)  Use a sunscreen that says "broad spectrum" so it covers both UVA and UVB rays, and make sure to reapply every 1-2 hours.  Remember to change the batteries in your smoke detectors when changing your clock times in the spring and fall.  Carbon monoxide detectors are recommended for your home.  Use your seat belt every time you are in a car, and please drive safely and not be distracted with cell phones and texting while driving.   Continue to get high dose flu shots yearly. I recommend that you get the updated COVID booster when it becomes available in the Fall. I recommend that you get the new RSV vaccine.  You will need to get this from the pharmacy (it is covered by Medicare Part D).  You are past due to get your tetanus booster (TdaP).  You will need to get this from the pharmacy.  These vaccines can either be given the same day, or separated by at least 2 weeks.  I'm not a fan of "no carb"  diets because the weight gain comes very quickly once you reintroduce carbs to your diet. I encourage you to follow a low-carb diet, lifelong--cutting back on sweets, breads, alcohol as we have previously recommended for your diabetes. Your A1c was up to 7%.  If this doesn't come down, we will need to start you on medications for your diabetes.  If you stay for a while on your current diet (lots of eggs and bacon), you may end up needing a higher statin dose, as these will raise your cholesterol.  Please increase your water intake (at least 6 glasses of water/day)--drink this early in the day, rather than after dinner, to limit your night-time awakenings.  STOP TAKING CELEBREX--It is hurting your kidneys.  Restart the aspirin.  Use caution with drinking tart cherry juice regularly--look at the label, I suspect this has a lot of sugar and may be contributing to your higher sugars/A1c. I'd prefer you to drink more water, rather than juice.  Return to have your kidney tests repeated in 3-4 weeks.

## 2022-04-14 ENCOUNTER — Ambulatory Visit (INDEPENDENT_AMBULATORY_CARE_PROVIDER_SITE_OTHER): Payer: Medicare Other | Admitting: Family Medicine

## 2022-04-14 ENCOUNTER — Encounter: Payer: Self-pay | Admitting: Internal Medicine

## 2022-04-14 ENCOUNTER — Encounter: Payer: Self-pay | Admitting: Family Medicine

## 2022-04-14 VITALS — BP 156/64 | HR 80 | Ht 67.0 in | Wt 183.6 lb

## 2022-04-14 DIAGNOSIS — Z0000 Encounter for general adult medical examination without abnormal findings: Secondary | ICD-10-CM

## 2022-04-14 DIAGNOSIS — E118 Type 2 diabetes mellitus with unspecified complications: Secondary | ICD-10-CM

## 2022-04-14 DIAGNOSIS — E1159 Type 2 diabetes mellitus with other circulatory complications: Secondary | ICD-10-CM

## 2022-04-14 DIAGNOSIS — I779 Disorder of arteries and arterioles, unspecified: Secondary | ICD-10-CM

## 2022-04-14 DIAGNOSIS — I152 Hypertension secondary to endocrine disorders: Secondary | ICD-10-CM

## 2022-04-14 DIAGNOSIS — E1169 Type 2 diabetes mellitus with other specified complication: Secondary | ICD-10-CM

## 2022-04-14 DIAGNOSIS — E785 Hyperlipidemia, unspecified: Secondary | ICD-10-CM

## 2022-04-14 DIAGNOSIS — J309 Allergic rhinitis, unspecified: Secondary | ICD-10-CM

## 2022-04-14 DIAGNOSIS — N183 Chronic kidney disease, stage 3 unspecified: Secondary | ICD-10-CM

## 2022-04-14 DIAGNOSIS — Z5181 Encounter for therapeutic drug level monitoring: Secondary | ICD-10-CM

## 2022-04-14 DIAGNOSIS — J449 Chronic obstructive pulmonary disease, unspecified: Secondary | ICD-10-CM

## 2022-04-14 DIAGNOSIS — R7989 Other specified abnormal findings of blood chemistry: Secondary | ICD-10-CM

## 2022-04-14 LAB — COMPREHENSIVE METABOLIC PANEL
ALT: 19 IU/L (ref 0–44)
AST: 23 IU/L (ref 0–40)
Albumin/Globulin Ratio: 2.1 (ref 1.2–2.2)
Albumin: 4.6 g/dL (ref 3.6–4.6)
Alkaline Phosphatase: 66 IU/L (ref 44–121)
BUN/Creatinine Ratio: 24 (ref 10–24)
BUN: 41 mg/dL — ABNORMAL HIGH (ref 10–36)
Bilirubin Total: 0.9 mg/dL (ref 0.0–1.2)
CO2: 18 mmol/L — ABNORMAL LOW (ref 20–29)
Calcium: 9.3 mg/dL (ref 8.6–10.2)
Chloride: 100 mmol/L (ref 96–106)
Creatinine, Ser: 1.68 mg/dL — ABNORMAL HIGH (ref 0.76–1.27)
Globulin, Total: 2.2 g/dL (ref 1.5–4.5)
Glucose: 126 mg/dL — ABNORMAL HIGH (ref 70–99)
Potassium: 5.2 mmol/L (ref 3.5–5.2)
Sodium: 138 mmol/L (ref 134–144)
Total Protein: 6.8 g/dL (ref 6.0–8.5)
eGFR: 38 mL/min/{1.73_m2} — ABNORMAL LOW (ref >59)

## 2022-04-14 LAB — HEMOGLOBIN A1C
Est. average glucose Bld gHb Est-mCnc: 154 mg/dL
Hgb A1c MFr Bld: 7 % — ABNORMAL HIGH (ref 4.8–5.6)

## 2022-04-14 LAB — LIPID PANEL
Chol/HDL Ratio: 2.8 ratio (ref 0.0–5.0)
Cholesterol, Total: 152 mg/dL (ref 100–199)
HDL: 54 mg/dL (ref >39)
LDL Chol Calc (NIH): 82 mg/dL (ref 0–99)
Triglycerides: 83 mg/dL (ref 0–149)
VLDL Cholesterol Cal: 16 mg/dL (ref 5–40)

## 2022-04-14 LAB — CBC WITH DIFFERENTIAL/PLATELET
Basophils Absolute: 0.1 10*3/uL (ref 0.0–0.2)
Basos: 1 %
EOS (ABSOLUTE): 0.2 10*3/uL (ref 0.0–0.4)
Eos: 2 %
Hematocrit: 38 % (ref 37.5–51.0)
Hemoglobin: 12.5 g/dL — ABNORMAL LOW (ref 13.0–17.7)
Immature Grans (Abs): 0.1 10*3/uL (ref 0.0–0.1)
Immature Granulocytes: 1 %
Lymphocytes Absolute: 0.9 10*3/uL (ref 0.7–3.1)
Lymphs: 10 %
MCH: 32.6 pg (ref 26.6–33.0)
MCHC: 32.9 g/dL (ref 31.5–35.7)
MCV: 99 fL — ABNORMAL HIGH (ref 79–97)
Monocytes Absolute: 1 10*3/uL — ABNORMAL HIGH (ref 0.1–0.9)
Monocytes: 11 %
Neutrophils Absolute: 7 10*3/uL (ref 1.4–7.0)
Neutrophils: 75 %
Platelets: 235 10*3/uL (ref 150–450)
RBC: 3.83 x10E6/uL — ABNORMAL LOW (ref 4.14–5.80)
RDW: 12 % (ref 11.6–15.4)
WBC: 9.2 10*3/uL (ref 3.4–10.8)

## 2022-04-14 LAB — MICROALBUMIN / CREATININE URINE RATIO
Creatinine, Urine: 260.3 mg/dL
Microalb/Creat Ratio: 47 mg/g creat — ABNORMAL HIGH (ref 0–29)
Microalbumin, Urine: 121.9 ug/mL

## 2022-04-16 DIAGNOSIS — N183 Chronic kidney disease, stage 3 unspecified: Secondary | ICD-10-CM | POA: Insufficient documentation

## 2022-04-23 ENCOUNTER — Other Ambulatory Visit: Payer: Self-pay | Admitting: Cardiovascular Disease

## 2022-04-23 LAB — FECAL OCCULT BLOOD, IMMUNOCHEMICAL: Fecal Occult Bld: NEGATIVE

## 2022-04-27 DIAGNOSIS — L821 Other seborrheic keratosis: Secondary | ICD-10-CM | POA: Diagnosis not present

## 2022-04-27 DIAGNOSIS — Z85828 Personal history of other malignant neoplasm of skin: Secondary | ICD-10-CM | POA: Diagnosis not present

## 2022-04-27 DIAGNOSIS — L820 Inflamed seborrheic keratosis: Secondary | ICD-10-CM | POA: Diagnosis not present

## 2022-04-27 DIAGNOSIS — L570 Actinic keratosis: Secondary | ICD-10-CM | POA: Diagnosis not present

## 2022-04-30 ENCOUNTER — Ambulatory Visit: Payer: Medicare Other | Admitting: Sports Medicine

## 2022-05-04 ENCOUNTER — Telehealth: Payer: Self-pay

## 2022-05-04 NOTE — Telephone Encounter (Signed)
Patient's xeomin PA has not been verified this year with his insurance.   Toxin: Xeomin 50 units q 12 weeks  CPT/Jcode: U1314 and 38887  Dx: Right hemifacial spasm G51.31  Provider: [name], MD  Please also confirm B/B.

## 2022-05-07 ENCOUNTER — Other Ambulatory Visit (INDEPENDENT_AMBULATORY_CARE_PROVIDER_SITE_OTHER): Payer: Medicare Other

## 2022-05-07 DIAGNOSIS — Z23 Encounter for immunization: Secondary | ICD-10-CM | POA: Diagnosis not present

## 2022-05-11 NOTE — Telephone Encounter (Signed)
Submitted benefits investigation via [employer] portal   Key # [id number]

## 2022-05-13 ENCOUNTER — Telehealth: Payer: Self-pay | Admitting: Neurology

## 2022-05-13 NOTE — Telephone Encounter (Signed)
Noted, thanks!

## 2022-05-13 NOTE — Telephone Encounter (Signed)
The appointment was cancelled due to pt being out of town, he states he will call back to r/s

## 2022-05-14 ENCOUNTER — Telehealth (HOSPITAL_BASED_OUTPATIENT_CLINIC_OR_DEPARTMENT_OTHER): Payer: Self-pay | Admitting: *Deleted

## 2022-05-14 NOTE — Telephone Encounter (Addendum)
Left  message for patient to call back to reschedule visit for same day as his wife  Mychart message sent as well

## 2022-05-14 NOTE — Telephone Encounter (Signed)
[employer] does not require Prior Authorization

## 2022-05-17 ENCOUNTER — Ambulatory Visit: Payer: Medicare Other | Admitting: Neurology

## 2022-05-18 ENCOUNTER — Encounter: Payer: Self-pay | Admitting: Internal Medicine

## 2022-05-18 NOTE — Telephone Encounter (Signed)
Spoke to son and moved appointment to Friday with wife

## 2022-05-20 ENCOUNTER — Ambulatory Visit (HOSPITAL_BASED_OUTPATIENT_CLINIC_OR_DEPARTMENT_OTHER): Payer: Medicare Other | Admitting: Cardiovascular Disease

## 2022-05-21 ENCOUNTER — Ambulatory Visit (HOSPITAL_BASED_OUTPATIENT_CLINIC_OR_DEPARTMENT_OTHER): Payer: Medicare Other | Admitting: Cardiovascular Disease

## 2022-05-21 ENCOUNTER — Encounter (HOSPITAL_BASED_OUTPATIENT_CLINIC_OR_DEPARTMENT_OTHER): Payer: Self-pay | Admitting: Cardiovascular Disease

## 2022-05-21 DIAGNOSIS — I10 Essential (primary) hypertension: Secondary | ICD-10-CM

## 2022-05-21 DIAGNOSIS — I6523 Occlusion and stenosis of bilateral carotid arteries: Secondary | ICD-10-CM

## 2022-05-21 DIAGNOSIS — E7800 Pure hypercholesterolemia, unspecified: Secondary | ICD-10-CM | POA: Diagnosis not present

## 2022-05-21 MED ORDER — AMLODIPINE BESYLATE 5 MG PO TABS: 5.0000 mg | ORAL_TABLET | Freq: Every day | ORAL | 3 refills | Status: DC

## 2022-05-21 NOTE — Assessment & Plan Note (Signed)
Mild carotid stenosis bilaterally.  Continue aspirin and atorvastatin.

## 2022-05-21 NOTE — Patient Instructions (Signed)
Medication Instructions:  STOP CARVEDILOL   START AMLODIPINE 5 MG DAILY   *If you need a refill on your cardiac medications before your next appointment, please call your pharmacy*  Lab Work: NONE  Testing/Procedures: NONE  Follow-Up: At [facility], you and your health needs are our priority.  As part of our continuing mission to provide you with exceptional heart care, we have created designated Provider Care Teams.  These Care Teams include your primary Cardiologist (physician) and Advanced Practice Providers (APPs -  Physician Assistants and Nurse Practitioners) who all work together to provide you with the care you need, when you need it.  We recommend signing up for the patient portal called "MyChart".  Sign up information is provided on this After Visit Summary.  MyChart is used to connect with patients for Virtual Visits (Telemedicine).  Patients are able to view lab/test results, encounter notes, upcoming appointments, etc.  Non-urgent messages can be sent to your provider as well.   To learn more about what you can do with MyChart, go to [email or web address].    Your next appointment:   9 month(s)  The format for your next appointment:   In Person  Provider:   Skeet Latch, MD   1 [place] NP   Other Instructions MONITOR BLOOD PRESSURE DAILY, LOG ON SHEETS PROVIDED. BRING LOG AND MACHINE TO FOLLOW UP IN 1 MONTH

## 2022-05-21 NOTE — Progress Notes (Signed)
Cardiology Office Note:    Date:  05/21/2022   ID:  Salvatore Decent, DOB 1930-02-01, MRN [id number]  PCP:  Rita Ohara, [place] [facility] Cardiologist:  Skeet Latch, MD     Referring MD: Rita Ohara, MD   No chief complaint on file.   History of Present Illness:    Peter [name] is a 88 y.o. male with a hx of hypertension, diabetes mellitus, mild carotid stenosis, hypercholesterolemia and asthma. Here for follow up.  He has a history of mild to moderate carotid stenosis.  He previously had nuclear stress test that was negative around the year 2007. He has struggled with his blood pressure and it was noted that he was taking carvedilol only once a day.  This was to twice a day.  At the last appointment he reported feeling generally well but had some occasional tightness in his chest.  Has been occurring on and off for the preceding 30 years.  Blood pressure was uncontrolled he wanted to switch carvedilol to long-acting.  We made that change.  He noted fatigue.  Blood counts were stable.  TSH remains slightly elevated but T3 and free T4 were normal.  ESR was normal.  He had a nuclear stress test 02/2022 that revealed LVEF 62% with no ischemia and no infarction.  Since his last appointment he has been well.  His only complaint is that he struggles with his balance.  He attributes this to his vision in his eyes.  He hasn't fallen.  He stumbled getting out of bed.  He had a cramp in his leg and jumped out of bed quickly.  His leg gave way and he stumbled.  He didn't injure himself.  He is very conscious about avoiding falls.  He has not had any chest pain and his breathing is stable.  He also notes that he has been feeling fatigued.  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 Carilyn Goodpasture      Current Medications: Current Meds  Medication Sig   albuterol (VENTOLIN HFA) 108 (90 Base) MCG/ACT inhaler INHALE 2 PUFFS EVERY 6 HOURS AS NEEDED FOR WHEEZING OR SHORTNESS OF BREATH.   amLODipine (NORVASC) 5 MG tablet Take 1 tablet (5 mg total) by mouth daily.   ANORO ELLIPTA 62.5-25 MCG/ACT AEPB INHALE 1 PUFF ONCE DAILY.   aspirin EC 81 MG tablet Take 81 mg by mouth daily.   atorvastatin (LIPITOR) 20 MG tablet Take 1 tablet (20 mg total) by mouth daily.   fluticasone (FLONASE) 50 MCG/ACT nasal spray Place 2 sprays into both nostrils daily.   incobotulinumtoxinA (XEOMIN) 50 units SOLR injection Inject 50 Units into the muscle every 3 (three) months.  losartan (COZAAR) 100 MG tablet Take 1 tablet (100 mg total) by mouth daily.   [DISCONTINUED] carvedilol (COREG CR) 20 MG 24 hr capsule Take 1 capsule (20 mg total) by mouth daily.   [DISCONTINUED] celecoxib (CELEBREX) 100 MG capsule Take 1 capsule (100 mg total) by mouth 2 (two) times daily.   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  Children live in [place], [place], [place], and 2 daughters and son here in [place], [place], [place]. 16 grandchildren, 11 great-grandchildren   Patient is right handed.  And drinks caffeine daily.      Updated 04/2022   Social Determinants of Health   Financial Resource Strain: Not on file  Food Insecurity: Not on file  Transportation Needs: Not on file  Physical Activity: Not on file  Stress: Not on file  Social Connections: Not on file     Family History: The patient's family history includes Alcohol abuse in his father; Arthritis in his daughter and daughter; COPD in his brother; Cancer in his brother, sister, and sister; Colon cancer (age of onset: 22) in his brother; Diabetes in his maternal grandmother; Hyperlipidemia in his son and son. There is no history of Stroke or Heart disease.  ROS:   Please see the history of present illness.    (+) Discomfort (+) Low energy All other systems reviewed and are negative.  EKGs/Labs/Other Studies Reviewed:    The following studies were reviewed today:  EKG:  02/22/2022 EKG: Rate 82. Sinus Rhythm.  Recent Labs: 02/23/2022: TSH 4.510 04/13/2022: ALT 19; BUN 41; Creatinine, Ser 1.68; Hemoglobin 12.5; Platelets 235; Potassium 5.2; Sodium 138   Recent Lipid Panel    Component Value Date/Time   CHOL 152 04/13/2022 1114   TRIG 83 04/13/2022 1114   HDL 54 04/13/2022 1114    CHOLHDL 2.8 04/13/2022 1114   CHOLHDL 2.9 12/17/2016 0722   VLDL 22 12/17/2016 0722   LDLCALC 82 04/13/2022 1114   [place] 02/2022:     The study is normal. Findings are consistent with no prior ischemia and no prior myocardial infarction. The study is low risk.   No ST deviation was noted.   LV perfusion is normal. There is no evidence of ischemia. There is no evidence of infarction.   Left ventricular function is normal. Nuclear stress EF: 62 %. The left ventricular ejection  fraction is normal (55-65%). End diastolic cavity size is normal. End systolic cavity size is normal.   Prior study available for comparison from 09/22/2006.  No changes noted.  Carotid Dopplers 03/09/22:  1-39% ICA stenosis bilaterally.  <50% ECA stenosis.  Physical Exam:    VS:  BP (!) 162/60   Pulse 63   Ht 5' 7"  (1.702 m)   Wt 187 lb 14.4 oz (85.2 kg)   SpO2 94%   BMI 29.43 kg/m  , BMI Body mass index is 29.43 kg/m. GENERAL:  Well appearing HEENT: Pupils equal round and reactive, fundi not visualized, oral mucosa unremarkable NECK:  No jugular venous distention, waveform within normal limits, carotid upstroke brisk and symmetric, no bruits, no thyromegaly LUNGS:  Clear to auscultation bilaterally HEART:  RRR.  PMI not displaced or sustained,S1 and S2 within normal limits, no S3, no S4, no clicks, no rubs, II/VI early-peaking systolic murmur at the left upper sternal border. ABD:  Flat, positive bowel sounds normal in frequency in pitch, no bruits, no rebound, no guarding, no midline pulsatile mass, no hepatomegaly, no splenomegaly EXT:  2 plus pulses throughout, no edema, no cyanosis no clubbing SKIN:  No rashes no nodules NEURO:  Cranial nerves II through XII grossly intact, motor grossly intact throughout PSYCH:  Cognitively intact, oriented to person place and time   ASSESSMENT:    1. Essential hypertension   2. Bilateral carotid artery stenosis   3. Pure hypercholesterolemia     PLAN:     Essential hypertension Pressure uncontrolled both initially and on repeat.  He also reports fatigue.  Labs have been unremarkable.  We did discuss trying to increase his exercise.  We will stop the carvedilol and switch to amlodipine 5 mg daily.  Continue losartan.  He will check his blood pressures and bring to follow-up in a month.  Bilateral carotid artery disease (HCC) Mild carotid stenosis bilaterally.  Continue aspirin and atorvastatin.  Pure hypercholesterolemia LDL goal is less than 70.  Continue atorvastatin.   Medication Adjustments/Labs and Tests Ordered: Current medicines are reviewed at length with the patient today.  Concerns regarding medicines are outlined above.  No orders of the defined types were placed in this encounter.  Meds ordered this encounter  Medications   amLODipine (NORVASC) 5 MG tablet    Sig: Take 1 tablet (5 mg total) by mouth daily.    Dispense:  90 tablet    Refill:  3    D/C CARVEDILOL    Patient Instructions  Medication Instructions:  STOP CARVEDILOL   START AMLODIPINE 5 MG DAILY   *If you need a refill on your cardiac medications before your next appointment, please call your pharmacy*  Lab Work: NONE  Testing/Procedures: NONE  Follow-Up: At [facility], you and your health needs are our priority.  As part of our continuing mission to provide you with exceptional heart care, we have created designated Provider Care Teams.  These Care Teams include your primary Cardiologist (physician) and Advanced Practice Providers (APPs -  Physician Assistants and Nurse Practitioners) who all work together to provide you with the care you need, when you need it.  We recommend signing up for the patient portal called "MyChart".  Sign up information is provided on this After Visit Summary.  MyChart is used to connect with patients for Virtual Visits (Telemedicine).  Patients are able to view lab/test results, encounter notes, upcoming  appointments, etc.  Non-urgent messages can be sent to your provider as  well.   To learn more about what you can do with MyChart, go to [email or web address].    Your next appointment:   9 month(s)  The format for your next appointment:   In Person  Provider:   Skeet Latch, MD   1 [place] NP   Other Instructions MONITOR BLOOD PRESSURE DAILY, LOG ON SHEETS PROVIDED. BRING LOG AND MACHINE TO FOLLOW UP IN 1 MONTH

## 2022-05-21 NOTE — Assessment & Plan Note (Signed)
Pressure uncontrolled both initially and on repeat.  He also reports fatigue.  Labs have been unremarkable.  We did discuss trying to increase his exercise.  We will stop the carvedilol and switch to amlodipine 5 mg daily.  Continue losartan.  He will check his blood pressures and bring to follow-up in a month.

## 2022-05-21 NOTE — Assessment & Plan Note (Signed)
LDL goal is less than 70.  Continue atorvastatin. 

## 2022-05-28 ENCOUNTER — Encounter (HOSPITAL_BASED_OUTPATIENT_CLINIC_OR_DEPARTMENT_OTHER): Payer: Self-pay | Admitting: Cardiovascular Disease

## 2022-05-28 NOTE — Telephone Encounter (Signed)
Not surprising that heart rate increased a bit with discontinuation of Carvedilol as it can lower heart rate. Recommend avoidance of caffeine, staying hydrated, managing stress as best we can. This can help lower the heart rate. Understandable to have some elevated heart rates after loss of a beloved pet. Blood pressures overall look good. I think it is more than reasonable to continue same medications and follow up in a month as scheduled.   Loel Dubonnet, NP

## 2022-06-24 DIAGNOSIS — M545 Low back pain, unspecified: Secondary | ICD-10-CM | POA: Diagnosis not present

## 2022-06-25 ENCOUNTER — Ambulatory Visit (HOSPITAL_BASED_OUTPATIENT_CLINIC_OR_DEPARTMENT_OTHER): Payer: Medicare Other | Admitting: Family

## 2022-06-28 ENCOUNTER — Other Ambulatory Visit (HOSPITAL_BASED_OUTPATIENT_CLINIC_OR_DEPARTMENT_OTHER): Payer: Self-pay

## 2022-06-28 MED ORDER — AREXVY 120 MCG/0.5ML IM SUSR
INTRAMUSCULAR | 0 refills | Status: DC
Start: 2022-06-28 — End: 2022-12-06
  Filled 2022-06-28: qty 0.5, 1d supply, fill #0

## 2022-07-09 ENCOUNTER — Other Ambulatory Visit: Payer: Self-pay | Admitting: Cardiovascular Disease

## 2022-07-09 NOTE — Telephone Encounter (Signed)
This is Dr. [place]'s pt.  °

## 2022-07-13 DIAGNOSIS — M25519 Pain in unspecified shoulder: Secondary | ICD-10-CM | POA: Diagnosis not present

## 2022-07-14 ENCOUNTER — Encounter: Payer: Self-pay | Admitting: Family Medicine

## 2022-07-22 DIAGNOSIS — M25519 Pain in unspecified shoulder: Secondary | ICD-10-CM | POA: Diagnosis not present

## 2022-07-27 ENCOUNTER — Ambulatory Visit (HOSPITAL_BASED_OUTPATIENT_CLINIC_OR_DEPARTMENT_OTHER): Payer: Medicare Other | Admitting: Family

## 2022-07-27 ENCOUNTER — Encounter (HOSPITAL_BASED_OUTPATIENT_CLINIC_OR_DEPARTMENT_OTHER): Payer: Self-pay | Admitting: Family

## 2022-07-27 VITALS — BP 150/62 | HR 62 | Ht 67.0 in | Wt 195.4 lb

## 2022-07-27 DIAGNOSIS — I10 Essential (primary) hypertension: Secondary | ICD-10-CM | POA: Diagnosis not present

## 2022-07-27 DIAGNOSIS — I6523 Occlusion and stenosis of bilateral carotid arteries: Secondary | ICD-10-CM

## 2022-07-27 DIAGNOSIS — E785 Hyperlipidemia, unspecified: Secondary | ICD-10-CM | POA: Diagnosis not present

## 2022-07-27 MED ORDER — AMLODIPINE BESYLATE 10 MG PO TABS: 10.0000 mg | ORAL_TABLET | Freq: Every day | ORAL | 3 refills | Status: DC

## 2022-07-27 NOTE — Patient Instructions (Addendum)
Medication Instructions:  Your physician has recommended you make the following change in your medication:   INCREASE Amlodipine to '10mg'$  once per day  We have sent in a prescription for the '10mg'$  tablet You may use up your '5mg'$  tablets by taking two of them together if you wish.   *If you need a refill on your cardiac medications before your next appointment, please call your pharmacy*   Lab Work/Testing/Procedures: None ordered today.   Follow-Up: At [facility], you and your health needs are our priority.  As part of our continuing mission to provide you with exceptional heart care, we have created designated Provider Care Teams.  These Care Teams include your primary Cardiologist (physician) and Advanced Practice Providers (APPs -  Physician Assistants and Nurse Practitioners) who all work together to provide you with the care you need, when you need it.  We recommend signing up for the patient portal called "MyChart".  Sign up information is provided on this After Visit Summary.  MyChart is used to connect with patients for Virtual Visits (Telemedicine).  Patients are able to view lab/test results, encounter notes, upcoming appointments, etc.  Non-urgent messages can be sent to your provider as well.   To learn more about what you can do with MyChart, go to [email or web address].    Your next appointment:   3 month(s)  The format for your next appointment:   In Person  Provider:   Skeet Latch, MD or Laurann [place], NP    Other Instructions Tips to Measure your Blood Pressure Correctly    Here's what you can do to ensure a correct reading:  Don't drink a caffeinated beverage or smoke during the 30 minutes before the test.  Sit quietly for five minutes before the test begins.  During the measurement, sit in a chair with your feet on the floor and your arm supported so your elbow is at about heart level.  The inflatable part of the cuff should completely cover at  least 80% of your upper arm, and the cuff should be placed on bare skin, not over a shirt.  Don't talk during the measurement.  Blood pressure categories  Blood pressure category SYSTOLIC (upper number)  DIASTOLIC (lower number)  Normal Less than 120 mm Hg and Less than 80 mm Hg  Elevated 120-129 mm Hg and Less than 80 mm Hg  High blood pressure: Stage 1 hypertension 130-139 mm Hg or 80-89 mm Hg  High blood pressure: Stage 2 hypertension 140 mm Hg or higher or 90 mm Hg or higher  Hypertensive crisis (consult your doctor immediately) Higher than 180 mm Hg and/or Higher than 120 mm Hg  Source: American Heart Association and American Stroke Association. For more on getting your blood pressure under control, buy Controlling Your Blood Pressure, a Special Health Report from [facility].   Blood Pressure Log   Date   Time  Blood Pressure  Example: Nov 1 9 AM 124/78

## 2022-07-27 NOTE — Progress Notes (Signed)
Office Visit    Patient Name: Peter [name] Date of Encounter: 07/27/2022  PCP:  Rita Ohara, [place]  Cardiologist:  Skeet Latch, MD  Advanced Practice Provider:  No care team member to display Electrophysiologist:  None     Chief Complaint    Peter [name] is a 88 y.o. 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   VASECTOMY      Allergies  Allergies  Allergen Reactions   Iodine Other (See Comments)    Feels like he is "burning up"    History of Present Illness    Peter [name] is a 88 y.o. male with a hx of hypertension, diabetes mellitus, mild carotid stenosis, hyperlipidemia, asthma last seen 05/21/2022.  Prior nuclear stress test negative around the year 2007.  Repeat nuclear stress test 02/2022 with LVEF 62%, no ischemia no infarction.  Has had difficult to manage blood pressure.  Last seen 05/21/2022 with elevated blood pressure as well as fatigue.  Labs have been unremarkable.  Carvedilol discontinued and switch to amlodipine 5 mg daily.  Losartan continued.  Presents today independently for follow up. Feels overall well on his new regimen.  His energy levels feels overall unchanged. Notes this has been an ongoing issue over the past five years. He attributes it to age. He was able to remodel a bathroom a few weeks ago by tearing out vanity, trim work. Since medication change notes heart rate previously in 60s now 70-80s with no palpitations. Discussed this was expected. BP at home 135-154/60s. Blood pressure cuff has previously been checked for accuracy. He is working to take medication at a consistent time each day. Weight has been up about 6 pounds with no new edema, dyspnea, orthopnea, PND.  EKGs/Labs/Other Studies Reviewed:   The following studies were reviewed today:  Myoview 02/26/22   The study is normal. Findings are consistent with no prior ischemia and no prior myocardial infarction. The study is low risk.   No ST deviation was noted.   LV perfusion is normal. There is no evidence of ischemia. There is no evidence of infarction.   Left ventricular function is normal. Nuclear stress EF: 62 %. The left ventricular ejection fraction is normal (55-65%). End diastolic cavity size is normal. End systolic cavity size is normal.   Prior study available for comparison from 09/22/2006.  No changes noted.   Carotid duplex 03/09/22  Summary:  Right Carotid: Velocities in the right ICA are consistent with a 1-39%  stenosis.                The ECA  appears <50% stenosed.   Left Carotid: Velocities in the left ICA are consistent with a 1-39%  stenosis.               The ECA appears <50% stenosed.  EKG:  EKG is not ordered today.    Recent Labs: 02/23/2022: TSH 4.510 04/13/2022: ALT 19; BUN 41; Creatinine, Ser 1.68; Hemoglobin 12.5; Platelets 235; Potassium 5.2; Sodium 138  Recent Lipid Panel    Component Value Date/Time   CHOL 152 04/13/2022 1114   TRIG 83 04/13/2022 1114   HDL 54 04/13/2022 1114   CHOLHDL 2.8 04/13/2022 1114   CHOLHDL 2.9 12/17/2016 0722   VLDL 22 12/17/2016 0722   LDLCALC 82 04/13/2022 1114     Home  Medications   Current Meds  Medication Sig   albuterol (VENTOLIN HFA) 108 (90 Base) MCG/ACT inhaler INHALE 2 PUFFS EVERY 6 HOURS AS NEEDED FOR WHEEZING OR SHORTNESS OF BREATH.   amLODipine (NORVASC) 10 MG tablet Take 1 tablet (10 mg total) by mouth daily.   ANORO ELLIPTA 62.5-25 MCG/ACT AEPB INHALE 1 PUFF ONCE DAILY.   aspirin EC 81 MG tablet Take 81 mg by mouth daily.   atorvastatin (LIPITOR) 20 MG tablet Take 1 tablet (20 mg total) by mouth daily.   fluticasone (FLONASE) 50 MCG/ACT nasal spray Place 2 sprays into both nostrils daily.   incobotulinumtoxinA (XEOMIN) 50 units SOLR injection Inject 50 Units into the muscle every 3 (three) months.   losartan (COZAAR) 100 MG tablet Take 1 tablet (100 mg total) by mouth daily.   RSV vaccine recomb adjuvanted (AREXVY) 120 MCG/0.5ML injection Inject into the muscle.   [DISCONTINUED] amLODipine (NORVASC) 5 MG tablet Take 1 tablet (5 mg total) by mouth daily.   Current Facility-Administered Medications for the 07/27/22 encounter (Office Visit) with Loel Dubonnet, NP  Medication   incobotulinumtoxinA (XEOMIN) 50 units injection 50 Units     Review of Systems      All other systems reviewed and are otherwise negative except as noted above.  Physical Exam    VS:  BP (!) 150/62 (BP Location: Right Arm, Patient Position: Sitting, Cuff Size: Normal)   Pulse 62   Ht '5\' 7"'$  (1.702 m)   Wt 195 lb 6.4 oz (88.6 kg)   BMI 30.60 kg/m  , BMI Body mass index is 30.6 kg/m.  Wt Readings from Last 3 Encounters:  07/27/22 195 lb 6.4 oz (88.6 kg)  05/21/22 187 lb 14.4 oz (85.2 kg)  04/14/22 183 lb 9.6 oz (83.3 kg)     GEN: Well nourished, well developed, in no acute distress. HEENT: normal. Neck: Supple, no JVD, carotid bruits, or masses. Cardiac: RRR, no murmurs, rubs, or gallops. No clubbing, cyanosis, edema.  Radials/PT 2+ and equal bilaterally.  Respiratory:  Respirations regular and unlabored, clear to auscultation bilaterally. GI: Soft,  nontender, nondistended. MS: No deformity or atrophy. Skin: Warm and dry, no rash. Neuro:  Strength and sensation are intact. Psych: Normal affect.  Assessment & Plan    Hypertension- BP not at goal <130/80. Energy level overall unchanged since discontinuation of Carvedilol. Labwork previously unremarkable. Continue Losartan '100mg'$  QD. Increase Amlodipine to '10mg'$  daily.  Check in regarding blood pressure via MyChart in 2 weeks. Discussed to monitor BP at home at least 2 hours after medications and sitting for 5-10 minutes.   Bilateral carotid artery disease-mild carotid stenosis bilaterally by duplex 03/2022.  Continue aspirin and atorvastatin. Repeat duplex in one year already ordered.  HLD, LDL goal  less than 70-  04/2022 LDL 81. Previous 71. Plans to work on diet/exercise. Continue atorvastatin.  HYPERTENSION CONTROL Vitals:   07/27/22 0906 07/27/22 0923  BP: (!) 165/74 (!) 150/62    The patient's blood pressure is elevated above target today.  In order to address the patient's elevated BP: A current anti-hypertensive medication was adjusted today.        Disposition: Follow up in 3 month(s) with Skeet Latch, MD or APP.  Signed, Loel Dubonnet, NP 07/27/2022, 9:41 AM [place]

## 2022-07-29 DIAGNOSIS — M25519 Pain in unspecified shoulder: Secondary | ICD-10-CM | POA: Diagnosis not present

## 2022-08-10 ENCOUNTER — Encounter (HOSPITAL_BASED_OUTPATIENT_CLINIC_OR_DEPARTMENT_OTHER): Payer: Self-pay

## 2022-08-23 ENCOUNTER — Ambulatory Visit: Payer: Medicare Other | Admitting: Sports Medicine

## 2022-08-24 ENCOUNTER — Ambulatory Visit: Payer: Medicare Other | Admitting: Sports Medicine

## 2022-08-24 ENCOUNTER — Encounter: Payer: Self-pay | Admitting: Sports Medicine

## 2022-08-24 ENCOUNTER — Ambulatory Visit: Payer: Self-pay

## 2022-08-24 DIAGNOSIS — M75101 Unspecified rotator cuff tear or rupture of right shoulder, not specified as traumatic: Secondary | ICD-10-CM

## 2022-08-24 DIAGNOSIS — G8929 Other chronic pain: Secondary | ICD-10-CM

## 2022-08-24 DIAGNOSIS — M12811 Other specific arthropathies, not elsewhere classified, right shoulder: Secondary | ICD-10-CM | POA: Diagnosis not present

## 2022-08-24 DIAGNOSIS — M25511 Pain in right shoulder: Secondary | ICD-10-CM

## 2022-08-24 NOTE — Progress Notes (Signed)
Peter [name] - 89 y.o. male MRN [id number]  Date of birth: 06/02/1930  Office Visit Note: Visit Date: 08/24/2022 PCP: Rita Ohara, MD Referred by: Rita Ohara, MD  Subjective: Chief Complaint  Patient presents with   Right Shoulder - Pain, Follow-up   HPI: Peter [name] is a pleasant 89 y.o. male who presents today for chronic right shoulder pain. His son Peter [name] is present for visit.  Last seen on 03/31/22 and had subacromial joint injection.  He has received these anywhere from 3-44-monthintervals with good relief.  He had been doing very well until about 4-5 days ago when he had an exacerbation of his pain.  This has slightly improved, although still painful for him for certain reaching motions.  Denies any swelling or redness.  According to Peter [name]'s son, (also Peter [name]) he did say that his father saw cardiology previously and they recommended for a separate issue that he maybe consider discontinuing celebrex consistent use.   Did review cardiology note from Dr. LMardelle Mattefrom 05/21/2022, he has slightly elevated blood pressure at 162/60.  Reviewed last lab result, hemoglobin A1c on 04/13/2022 was 7.0 (was 6.6 on 07/27/21). Scr was 1.68 on lab review (last 1.34)  Pertinent ROS were reviewed with the patient and found to be negative unless otherwise specified above in HPI.   Assessment & Plan: Visit Diagnoses:  1. Chronic right shoulder pain   2. Rotator cuff tear arthropathy of right shoulder   3. Tear of right supraspinatus tendon    Plan: Discussed with Peter [name] today treatment options for his right shoulder pain.  He has had a recent exacerbation of his chronic condition with known supraspinatus and additional rotator cuff tearing (arthropathy) and mild GHJ osteoarthritis.  He has done well with his previous injection that was almost 6 months ago, through shared decision making did elect to repeat this today under ultrasound guidance, patient tolerated well.  Given review of  cardiology note and recent labs, I do think it is wise to completely discontinue Celebrex and other NSAIDs.  Will stop this going forward given for as needed pain.  He may use topical over-the-counter medications or lidocaine patches as he wishes.  Continue to keep the shoulder moving and staying active.  He will follow-up with me as needed.  *1 chronic with exacerbation; review of external note, lab test, historian other than patient; Rx management  Follow-up: Return if symptoms worsen or fail to improve.   Meds & Orders: No orders of the defined types were placed in this encounter.   Orders Placed This Encounter  Procedures   UKoreaGuided Needle Placement - No Linked Charges     Procedures: No procedures performed      Clinical History: No specialty comments available.  He reports that he quit smoking about 48 years ago. His smoking use included cigarettes. He has never used smokeless tobacco.  Recent Labs    04/13/22 1114  HGBA1C 7.0*    Objective:    Physical Exam  Gen: Well-appearing, in no acute distress; non-toxic CV: Regular Rate. Well-perfused. Warm.  Resp: Breathing unlabored on room air; no wheezing. Psych: Fluid speech in conversation; appropriate affect; normal thought process Neuro: Sensation intact throughout. No gross coordination deficits.   Ortho Exam - Right shoulder: Evaluation of the shoulder shows no erythema, ecchymosis or effusion.  There is no specific bony TTP.  He has limited active range of motion with forward flexion to about 70 degrees, abduction to 60 degrees.  I  am able to take him through full range of motion actively without significant pain.  There is some pain with impingement testing today.  He does use scapular compensation to get the arm up above 90 degrees, but is able to do so.  Radial pulse intact.  Neurovascular intact distally.  Imaging:  XR Shoulder Right 3 views of the right shoulder including Grashey, scapular Y, and axial  views  were obtained and reviewed by myself.  X-rays demonstrate mild  glenohumeral joint arthritis as well as at least moderate AC joint  arthropathy.  Humeral head is well-seated with good position.  No other  acute bony abnormalities noted.    Past Medical/Family/Surgical/Social History: Medications & Allergies reviewed per EMR, new medications updated. Patient Active Problem List   Diagnosis Date Noted   Stage 3 chronic kidney disease ([place]) 04/16/2022   Fatigue 02/22/2022   Bilateral carotid artery disease ([place]) 02/22/2022   Acute pain of right foot 09/17/2021   Traumatic partial tear of right biceps tendon 06/30/2021   Rupture of pectoralis major muscle 06/30/2021   Greater trochanteric pain syndrome 01/24/2018   Hemiparesis affecting right side as late effect of cerebrovascular accident ([place]) 06/15/2017   Mixed hyperlipidemia 03/30/2017   Rotator cuff tear arthropathy of right shoulder 04/14/2016   Hemifacial spasm 02/12/2016   Abnormality of gait 02/12/2016   Increased prostate specific antigen (PSA) velocity 01/07/2016   Type 2 diabetes mellitus without complication ([place]) 85/46/2703   Full thickness rotator cuff tear, Left Shoulder 12/03/2014   Injury of left shoulder 08/22/2014   Facial nerve spasm 08/13/2014   Restlessness and agitation 08/13/2014   Allergic rhinitis 11/22/2012   Chest tightness 11/07/2012   Controlled type 2 diabetes mellitus with complication, without long-term current use of insulin ([place]) 01/21/2011   Pure hypercholesterolemia 01/21/2011   Essential hypertension 01/21/2011   Erectile dysfunction 01/21/2011   Past Medical History:  Diagnosis Date   Adenomatous colon polyp 07/2013   Asthma h/o as a child-s/p immunotherapy   Diabetes mellitus diet controlled   Facial nerve spasm 08/13/2014   Fatigue 02/22/2022   FHx: colon cancer    Hiatal hernia    Hypercholesteremia    Hypertension age 58   Internal hemorrhoids 07/2013   noted on colonoscopy    Restlessness and agitation 08/13/2014   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 09/18/2019   Dr.Whitfield   Family History  Problem Relation Age of Onset   Alcohol abuse Father    Cancer Sister        stomach/?pancreatic   Colon cancer Brother 81   Cancer Brother        colon cancer in mid 4's   Arthritis Daughter        psoriatic   Hyperlipidemia Son    Arthritis Daughter        psoriatic   Hyperlipidemia Son    Diabetes Maternal Grandmother    Cancer Sister        lung and brain   COPD Brother    Stroke Neg Hx    Heart disease Neg Hx    Past Surgical History:  Procedure Laterality Date   APPENDECTOMY     BLEPHAROPLASTY Bilateral 12/13/2016   CARDIOVASCULAR STRESS TEST  normal   CATARACT EXTRACTION, BILATERAL  07/2014, 08/2014   COLONOSCOPY  3/08, 07/2013   no repeat rec due to age per Dr. 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

## 2022-08-26 DIAGNOSIS — M25519 Pain in unspecified shoulder: Secondary | ICD-10-CM | POA: Diagnosis not present

## 2022-09-02 DIAGNOSIS — M25519 Pain in unspecified shoulder: Secondary | ICD-10-CM | POA: Diagnosis not present

## 2022-09-08 ENCOUNTER — Other Ambulatory Visit (HOSPITAL_BASED_OUTPATIENT_CLINIC_OR_DEPARTMENT_OTHER): Payer: Self-pay

## 2022-09-08 MED ORDER — TETANUS-DIPHTH-ACELL PERTUSSIS 5-2.5-18.5 LF-MCG/0.5 IM SUSY
PREFILLED_SYRINGE | INTRAMUSCULAR | 0 refills | Status: DC
  Filled 2022-09-08: qty 0.5, 1d supply, fill #0

## 2022-09-09 DIAGNOSIS — M25519 Pain in unspecified shoulder: Secondary | ICD-10-CM | POA: Diagnosis not present

## 2022-09-16 DIAGNOSIS — M25519 Pain in unspecified shoulder: Secondary | ICD-10-CM | POA: Diagnosis not present

## 2022-09-27 DIAGNOSIS — K080 Exfoliation of teeth due to systemic causes: Secondary | ICD-10-CM | POA: Diagnosis not present

## 2022-09-30 DIAGNOSIS — M25519 Pain in unspecified shoulder: Secondary | ICD-10-CM | POA: Diagnosis not present

## 2022-10-05 ENCOUNTER — Other Ambulatory Visit: Payer: Medicare Other

## 2022-10-05 DIAGNOSIS — Z5181 Encounter for therapeutic drug level monitoring: Secondary | ICD-10-CM

## 2022-10-05 DIAGNOSIS — R7989 Other specified abnormal findings of blood chemistry: Secondary | ICD-10-CM

## 2022-10-05 LAB — BASIC METABOLIC PANEL
BUN/Creatinine Ratio: 19 (ref 10–24)
BUN: 25 mg/dL (ref 10–36)
CO2: 21 mmol/L (ref 20–29)
Calcium: 9.3 mg/dL (ref 8.6–10.2)
Chloride: 104 mmol/L (ref 96–106)
Creatinine, Ser: 1.33 mg/dL — ABNORMAL HIGH (ref 0.76–1.27)
Glucose: 129 mg/dL — ABNORMAL HIGH (ref 70–99)
Potassium: 5.7 mmol/L — ABNORMAL HIGH (ref 3.5–5.2)
Sodium: 140 mmol/L (ref 134–144)
eGFR: 51 mL/min/{1.73_m2} — ABNORMAL LOW (ref >59)

## 2022-10-08 NOTE — Progress Notes (Signed)
Per his SEPTEMBER visit: "F/u 6 mos for AWV/med check, CPE 1 year. B-met in 3-4 weeks to f/u on Cr" He JUST had this recheck done, and was NOT set up for any of the other visits.  He is due in March for the AWV/med check (he is a diabetic).   Advise that his kidney function improved since stopping the celebrex (back to his baseline of 1.3 range).  His potassium was high--not sure if he was a hard stick or not.  Make sure that he isn't using any "salt substitutes" that contain potassium, that he isn't taking any potassium supplements or eating a lot of bananas.  I plan to recheck when he comes for his AWV/med check (that needs to be scheduled)

## 2022-10-15 ENCOUNTER — Ambulatory Visit: Payer: Medicare Other | Admitting: Sports Medicine

## 2022-10-15 ENCOUNTER — Ambulatory Visit (INDEPENDENT_AMBULATORY_CARE_PROVIDER_SITE_OTHER): Payer: Medicare Other

## 2022-10-15 ENCOUNTER — Encounter: Payer: Self-pay | Admitting: Sports Medicine

## 2022-10-15 DIAGNOSIS — M25561 Pain in right knee: Secondary | ICD-10-CM | POA: Diagnosis not present

## 2022-10-15 DIAGNOSIS — M25511 Pain in right shoulder: Secondary | ICD-10-CM | POA: Diagnosis not present

## 2022-10-15 DIAGNOSIS — M2141 Flat foot [pes planus] (acquired), right foot: Secondary | ICD-10-CM

## 2022-10-15 DIAGNOSIS — G5793 Unspecified mononeuropathy of bilateral lower limbs: Secondary | ICD-10-CM

## 2022-10-15 DIAGNOSIS — M2142 Flat foot [pes planus] (acquired), left foot: Secondary | ICD-10-CM

## 2022-10-15 DIAGNOSIS — G8929 Other chronic pain: Secondary | ICD-10-CM

## 2022-10-15 DIAGNOSIS — M1711 Unilateral primary osteoarthritis, right knee: Secondary | ICD-10-CM

## 2022-10-15 MED ORDER — METHYLPREDNISOLONE ACETATE 40 MG/ML IJ SUSP
40.0000 mg | INTRAMUSCULAR | Status: AC | PRN
  Administered 2022-10-15: 40 mg via INTRA_ARTICULAR

## 2022-10-15 MED ORDER — GABAPENTIN 100 MG PO CAPS: 100.0000 mg | ORAL_CAPSULE | Freq: Every day | ORAL | 0 refills | Status: DC

## 2022-10-15 MED ORDER — BUPIVACAINE HCL 0.25 % IJ SOLN
2.0000 mL | INTRAMUSCULAR | Status: AC | PRN
  Administered 2022-10-15: 2 mL via INTRA_ARTICULAR

## 2022-10-15 MED ORDER — LIDOCAINE HCL 1 % IJ SOLN
2.0000 mL | INTRAMUSCULAR | Status: AC | PRN
  Administered 2022-10-15: 2 mL

## 2022-10-15 NOTE — Progress Notes (Signed)
Months of right knee pain No injury Denies any swelling States he takes an occasional ibuprofen for pain   Also complaining of bilateral foot numbness He is a type 2 diabetic; no medication

## 2022-10-15 NOTE — Progress Notes (Signed)
Ravaughn Busscher - 89 y.o. male MRN [id number]  Date of birth: [birth year]  Office Visit Note: Visit Date: 10/15/2022 PCP: Rita Ohara, MD Referred by: Rita Ohara, MD  Subjective: Chief Complaint  Patient presents with   Right Knee - Pain   HPI: Peter [name] is a pleasant 89 y.o. male who presents today for acute on chronic right knee pain.  He presents with his son, Gene, today.  Right knee pain -has had knee pain for the last few years, has been worse over the last couple months.  Denies any clicking, locking or catching.  Does note some mild swelling.  Worse with standing or prolonged walking.  Pain is worse more so over the anterior joint line.  Does not take anything for this on a consistent basis.  Right shoulder - his right shoulder is doing well, significant reduction in pain status post ultrasound-guided subacromial joint injection on 08/24/2022.  Heels/feet - his son, "Gene" (same name) tells me today that his dad has been having some neuropathy-like pain of his bilateral feet.  States his pain will be worse at night and at the end of the day, feels like a tingling sensation over the plantar aspect of both feet.  She does have diabetes that has been diet controlled, had been on metformin for a brief period of time but has not had any significant treatment.  Denies any injury to the feet.  He has had some flatfeet in the past and has been in sports insoles with scaphoid pad to help support his arch.   Pertinent ROS were reviewed with the patient and found to be negative unless otherwise specified above in HPI.   Assessment & Plan: Visit Diagnoses:  1. Unilateral primary osteoarthritis, right knee   2. Chronic right shoulder pain   3. Chronic pain of right knee   4. Neuropathy of both feet   5. Pes planus of both feet    Plan: Discussed with Gene and his son today, that he is having an exacerbation of his chronic underlying knee osteoarthritis.  Discussed all  treatment options, he is not interested in taking oral medication for this.  Through shared decision-making, elected to proceed with corticosteroid injection into the right knee, patient tolerated well.  His shoulder pain is doing well status post this injection.  Recommend he continue to use the shoulder as appropriate.  He does have what appears to be some neuropathy of her bilateral feet, this could be diabetic or age-related.  He does have some 1+ edema of the lower extremities.  Did recommend compression socks to help dry out the fluid.  He will talk with his PCP and see if possibly reducing his Norvasc from 10 to 5 mg may help decrease the swelling -will leave this per her discretion.  He will keep in his sports insoles to help support his foot shape and arch loss.  We will do a trial of a very low-dose of gabapentin, 100 mg to be taken nightly.  He will take this for the next month and we will reevaluate if this is helping his neuropathy at all. Discussed R/B/I.   Follow-up: Return if symptoms worsen or fail to improve.   Meds & Orders: No orders of the defined types were placed in this encounter.   Orders Placed This Encounter  Procedures   Large Joint Inj   XR Knee Complete 4 Views Right     Procedures: Large Joint Inj: R knee on 10/15/2022  3:37 PM Indications: pain and joint swelling Details: 22 G 1.5 in needle, anteromedial approach Medications: 2 mL lidocaine 1 %; 2 mL bupivacaine 0.25 %; 40 mg methylPREDNISolone acetate 40 MG/ML Outcome: tolerated well, no immediate complications  Knee Injection, Right: After discussion on risks/benefits/indications, informed verbal consent was obtained and a timeout was performed, patient was seated on exam table. The patient's knee was prepped with Betadine and alcohol swab and utilizing anteromedial approach, the patient's knee was injected intraarticularly with 2:2:1 lidocaine 1%:bupivicaine 0.25%:depomedrol. Patient tolerated the procedure well  without immediate complications.  Procedure, treatment alternatives, risks and benefits explained, specific risks discussed. Consent was given by the patient. Immediately prior to procedure a time out was called to verify the correct patient, procedure, equipment, support staff and site/side marked as required. Patient was prepped and draped in the usual sterile fashion.          Clinical History: No specialty comments available.  He reports that he quit smoking about 48 years ago. His smoking use included cigarettes. He has never used smokeless tobacco.  Recent Labs    04/13/22 1114  HGBA1C 7.0*    Objective:    Physical Exam  Gen: Well-appearing, in no acute distress; non-toxic CV: Well-perfused. Warm.  Resp: Breathing unlabored on room air; no wheezing. Psych: Fluid speech in conversation; appropriate affect; normal thought process Neuro: Sensation intact throughout. No gross coordination deficits.   Ortho Exam - Right knee: There is a small effusion of the right knee joint.  Range of motion from 0-125 degrees.  There is some pseudo instability with varus stress testing.  There is medial and lateral joint line TTP.  Strength is relatively well-preserved and equivocal to the contralateral knee.  - Bilateral feet: There is rather significant loss of longitudinal arch upon standing.  At least moderate pes planovalgus.  Mild xerosis of the underlying heel.  There is a hammertoe deformity of the right second toe with a mild bunion formation with crossover sign.  Imaging: XR Knee Complete 4 Views Right  Result Date: 10/15/2022 4 views of the right knee including AP, Rosenberg, lateral and sunrise views were ordered and reviewed by myself.  X-rays demonstrate mild to moderate tricompartmental osteoarthritic change.  Lutricia Feil view does show collapse of the lateral joint line with near bone-on-bone arthritic change of the lateral side.  There is some tibial spurring off the lateral  aspect.  No acute bony fracture noted.   Past Medical/Family/Surgical/Social History: Medications & Allergies reviewed per EMR, new medications updated. Patient Active Problem List   Diagnosis Date Noted   Stage 3 chronic kidney disease ([place]) 04/16/2022   Fatigue 02/22/2022   Bilateral carotid artery disease ([place]) 02/22/2022   Acute pain of right foot 09/17/2021   Traumatic partial tear of right biceps tendon 06/30/2021   Rupture of pectoralis major muscle 06/30/2021   Greater trochanteric pain syndrome 01/24/2018   Hemiparesis affecting right side as late effect of cerebrovascular accident ([place]) 06/15/2017   Mixed hyperlipidemia 03/30/2017   Rotator cuff tear arthropathy of right shoulder 04/14/2016   Hemifacial spasm 02/12/2016   Abnormality of gait 02/12/2016   Increased prostate specific antigen (PSA) velocity 01/07/2016   Type 2 diabetes mellitus without complication ([place]) 123XX123   Full thickness rotator cuff tear, Left Shoulder 12/03/2014   Injury of left shoulder 08/22/2014   Facial nerve spasm 08/13/2014   Restlessness and agitation 08/13/2014   Allergic rhinitis 11/22/2012   Chest tightness 11/07/2012   Controlled type 2 diabetes mellitus  with complication, without long-term current use of insulin ([place]) 01/21/2011   Pure hypercholesterolemia 01/21/2011   Essential hypertension 01/21/2011   Erectile dysfunction 01/21/2011   Past Medical History:  Diagnosis Date   Adenomatous colon polyp 07/2013   Asthma h/o as a child-s/p immunotherapy   Diabetes mellitus diet controlled   Facial nerve spasm 08/13/2014   Fatigue 02/22/2022   FHx: colon cancer    Hiatal hernia    Hypercholesteremia    Hypertension age 79   Internal hemorrhoids 07/2013   noted on colonoscopy   Restlessness and agitation 08/13/2014   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 09/18/2019   Dr.Whitfield   Family History  Problem Relation Age of Onset   Alcohol abuse Father     Cancer Sister        stomach/?pancreatic   Colon cancer Brother 63   Cancer Brother        colon cancer in mid 3's   Arthritis Daughter        psoriatic   Hyperlipidemia Son    Arthritis Daughter        psoriatic   Hyperlipidemia Son    Diabetes Maternal Grandmother    Cancer Sister        lung and brain   COPD Brother    Stroke Neg Hx    Heart disease Neg Hx    Past Surgical History:  Procedure Laterality Date   APPENDECTOMY     BLEPHAROPLASTY Bilateral 12/13/2016   CARDIOVASCULAR STRESS TEST  normal   CATARACT EXTRACTION, BILATERAL  07/2014, 08/2014   COLONOSCOPY  3/08, 07/2013   no repeat rec due to age per 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

## 2022-10-21 DIAGNOSIS — M25519 Pain in unspecified shoulder: Secondary | ICD-10-CM | POA: Diagnosis not present

## 2022-10-28 DIAGNOSIS — M25519 Pain in unspecified shoulder: Secondary | ICD-10-CM | POA: Diagnosis not present

## 2022-10-29 ENCOUNTER — Other Ambulatory Visit (HOSPITAL_BASED_OUTPATIENT_CLINIC_OR_DEPARTMENT_OTHER): Payer: Self-pay

## 2022-10-29 MED ORDER — COMIRNATY 30 MCG/0.3ML IM SUSY
PREFILLED_SYRINGE | INTRAMUSCULAR | 0 refills | Status: DC
  Filled 2022-10-29: qty 0.3, 1d supply, fill #0

## 2022-11-08 ENCOUNTER — Telehealth: Payer: Self-pay | Admitting: Sports Medicine

## 2022-11-08 NOTE — Telephone Encounter (Signed)
Patient is having some issues with his knee and its becoming very week , his son is asking to speak to [facility]. Please advise [phone] Gene JR

## 2022-11-08 NOTE — Telephone Encounter (Signed)
Called # provided; left VM

## 2022-11-08 NOTE — Telephone Encounter (Signed)
Son returned call; feels like his dads knee is no better and very worried. Made appointment for 11/09/22 @ 145 to be seen again

## 2022-11-09 ENCOUNTER — Ambulatory Visit: Payer: Medicare Other | Admitting: Sports Medicine

## 2022-11-09 ENCOUNTER — Encounter: Payer: Self-pay | Admitting: Sports Medicine

## 2022-11-09 ENCOUNTER — Other Ambulatory Visit (INDEPENDENT_AMBULATORY_CARE_PROVIDER_SITE_OTHER): Payer: Medicare Other

## 2022-11-09 DIAGNOSIS — G8929 Other chronic pain: Secondary | ICD-10-CM

## 2022-11-09 DIAGNOSIS — M1711 Unilateral primary osteoarthritis, right knee: Secondary | ICD-10-CM | POA: Diagnosis not present

## 2022-11-09 DIAGNOSIS — M545 Low back pain, unspecified: Secondary | ICD-10-CM

## 2022-11-09 DIAGNOSIS — M4316 Spondylolisthesis, lumbar region: Secondary | ICD-10-CM | POA: Diagnosis not present

## 2022-11-09 DIAGNOSIS — R2689 Other abnormalities of gait and mobility: Secondary | ICD-10-CM | POA: Diagnosis not present

## 2022-11-09 DIAGNOSIS — M4306 Spondylolysis, lumbar region: Secondary | ICD-10-CM

## 2022-11-09 NOTE — Progress Notes (Signed)
Does not feel like injection helped much  States he is still having a lot of pain  Does complain of swelling into that knee/leg

## 2022-11-09 NOTE — Progress Notes (Signed)
Peter [name] - 89 y.o. male MRN [id number]  Date of birth: 08-17-1929  Office Visit Note: Visit Date: 11/09/2022 PCP: Rita Ohara, MD Referred by: Rita Ohara, MD  Subjective: Chief Complaint  Patient presents with   Right Knee - Pain   HPI: Peter [name] is a pleasant 89 y.o. male who presents today for follow-up of right knee pain.  During last visit on 10/15/2022 we did proceed with corticosteroid injection into the knee, he states he does not feel like this was helpful.  Describes more throbbing-like pain later in the day or at night.  Denies any mechanical symptoms or giving out of the knees.  His son, Peter [name], is also present at the visit today and he notices that he has had some balance issues and feels like the legs are weaker than they have been in months past.  Per the patient, he has had balance issues for 5-6 years.  He does do a physical therapy based exercises at [employer] once weekly.  He has had numbness and tingling of bilateral feet for a while.  Also has back pain in the low back, denies any radicular pain going down the legs other than numbness and tingling in both of the feet.  He does walk with a cane.  Pertinent ROS were reviewed with the patient and found to be negative unless otherwise specified above in HPI.   Assessment & Plan: Visit Diagnoses:  1. Chronic bilateral low back pain without sciatica   2. Anterolisthesis of lumbar spine   3. Spondylolysis, lumbar region   4. Balance problem   5. Unilateral primary osteoarthritis, right knee    Plan: I had a long discussion with Peter [name] and his son today regarding his knee pain.  He does have rather advanced osteoarthritis of the knee, worse in the lateral compartment with bone-on-bone change.  Unfortunately he did not receive benefit from the corticosteroid injection.  Given the degree of his arthritis and sclerotic change, I am more suspicious for an insufficiency fracture or bony edema within the leg.  We  discussed further evaluating this with MRI and other internal structures, although they would like to hold off on this for now.  They did want to proceed with viscosupplementation to help support the joint, we did send this off for prior authorization, he may return when this is approved for the injection.  Did recommend modified activity, rest and offloading the joint with the use of his cane.  He is not interested in formalized physical therapy or bracing at this time.  In terms of his balance issues, this has been more of a chronic process.  He does have spondylitic change and arthritic change of the low back.  He has some symptoms suggestive of neuropathy of the bilateral feet.  We discussed evaluating the lumbar spine with MRI or EMG/nerve conduction study of the lower extremities, he would like to hold off on this for now.  I did ask that he add proprioception and balance activities to his fitness classes at [employer].  He has gabapentin 100 mg may take nightly for neuropathy-like pain.  If at any point he would like to go through with the MRI or if studies, he will let me know.  He will follow-up for viscosupplementation once approved.  Follow-up: Return for for viscosupplementation.   Meds & Orders: No orders of the defined types were placed in this encounter.   Orders Placed This Encounter  Procedures   XR  Lumbar Spine Complete W/Bend     Procedures: No procedures performed      Clinical History: No specialty comments available.  He reports that he quit smoking about 48 years ago. His smoking use included cigarettes. He has never used smokeless tobacco.  Recent Labs    04/13/22 1114  HGBA1C 7.0*    Objective:    Physical Exam  Gen: Well-appearing, in no acute distress; non-toxic CV: Well-perfused. Warm.  Resp: Breathing unlabored on room air; no wheezing. Psych: Fluid speech in conversation; appropriate affect; normal thought process Neuro: Sensation intact throughout. No  gross coordination deficits.   Ortho Exam - Lumbar: There is some mild pain at the L5 region upon palpation around the surrounding paraspinal musculature.  There is active and passive flexion and extension with pain at end range of both.  Negative modified slump's test.   - Right knee: No significant effusion or redness.  Range of motion from 0-125 degrees.  There is pain over the lateral and medial joint line upon palpation.  Neurovascular intact distally.  Imaging: XR Lumbar Spine Complete W/Bend  Result Date: 11/09/2022 4 views of the lumbar spine including AP, lateral and flexion and extension views were ordered and reviewed by myself.  X-rays show spondylosis of the lumbar spine that is moderate to severe at the L1-L3 level and severe at the L5-S1 level.  There is anterior listhesis of L4 on L5 that is worsened with flexion/extension, grade 1-2.  No acute fracture noted.   4 views of the right knee including AP, Rosenberg, lateral and sunrise  views were ordered and reviewed by myself.  X-rays demonstrate mild to  moderate tricompartmental osteoarthritic change.  Lutricia Feil view does show  collapse of the lateral joint line with near bone-on-bone arthritic change  of the lateral side.  There is some tibial spurring off the lateral  aspect.  No acute bony fracture noted.   Past Medical/Family/Surgical/Social History: Medications & Allergies reviewed per EMR, new medications updated. Patient Active Problem List   Diagnosis Date Noted   Stage 3 chronic kidney disease 04/16/2022   Fatigue 02/22/2022   Bilateral carotid artery disease 02/22/2022   Acute pain of right foot 09/17/2021   Traumatic partial tear of right biceps tendon 06/30/2021   Rupture of pectoralis major muscle 06/30/2021   Greater trochanteric pain syndrome 01/24/2018   Hemiparesis affecting right side as late effect of cerebrovascular accident 06/15/2017   Mixed hyperlipidemia 03/30/2017   Rotator cuff tear arthropathy  of right shoulder 04/14/2016   Hemifacial spasm 02/12/2016   Abnormality of gait 02/12/2016   Increased prostate specific antigen (PSA) velocity 01/07/2016   Type 2 diabetes mellitus without complication ([facility]) [id number]   Full thickness rotator cuff tear, Left Shoulder 12/03/2014   Injury of left shoulder 08/22/2014   Facial nerve spasm 08/13/2014   Restlessness and agitation 08/13/2014   Allergic rhinitis 11/22/2012   Chest tightness 11/07/2012   Controlled type 2 diabetes mellitus with complication, without long-term current use of insulin 01/21/2011   Pure hypercholesterolemia 01/21/2011   Essential hypertension 01/21/2011   Erectile dysfunction 01/21/2011   Past Medical History:  Diagnosis Date   Adenomatous colon polyp 07/2013   Asthma h/o as a child-s/p immunotherapy   Diabetes mellitus diet controlled   Facial nerve spasm 08/13/2014   Fatigue 02/22/2022   FHx: colon cancer    Hiatal hernia    Hypercholesteremia    Hypertension age 79   Internal hemorrhoids 07/2013   noted on colonoscopy  Restlessness and agitation 08/13/2014   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 09/18/2019   Dr.Whitfield   Family History  Problem Relation Age of Onset   Alcohol abuse Father    Cancer Sister        stomach/?pancreatic   Colon cancer Brother 43   Cancer Brother        colon cancer in mid 44's   Arthritis Daughter        psoriatic   Hyperlipidemia Son    Arthritis Daughter        psoriatic   Hyperlipidemia Son    Diabetes Maternal Grandmother    Cancer Sister        lung and brain   COPD Brother    Stroke Neg Hx    Heart disease Neg Hx    Past Surgical History:  Procedure Laterality Date   APPENDECTOMY     BLEPHAROPLASTY Bilateral 12/13/2016   CARDIOVASCULAR STRESS TEST  normal   CATARACT EXTRACTION, BILATERAL  07/2014, 08/2014   COLONOSCOPY  3/08, 07/2013   no repeat rec due to age per Dr. 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)  Tobacco Use   Smoking status: Former    Types: Cigarettes    Quit date: 08/09/1974    Years since quitting: 48.2   Smokeless tobacco: Never  Vaping Use   Vaping Use: Never used  Substance and Sexual Activity   Alcohol use: Yes    Alcohol/week: 8.0 standard drinks of alcohol    Types: 8 Cans of beer per week    Comment: 8 beers/week, none in the last week (started no carb diet)   Drug use: No   Sexual activity: Yes    Partners: Female   I spent 37 minutes in the care of the patient today including face-to-face time, preparation to see the patient, as well as review of previous x-ray imaging (right knee),  The Patient Wanted to Sign on Proprioception and Balance in Therapy, Discussion on Additional Studies Such As EMG And/or MRI for the above diagnoses.   Elba Barman, DO Primary Care Sports Medicine Physician  [place] [facility]  This note was dictated using Dragon naturally speaking software and may contain errors in syntax, spelling, or content which have not been identified prior to signing this note.

## 2022-11-11 DIAGNOSIS — M25519 Pain in unspecified shoulder: Secondary | ICD-10-CM | POA: Diagnosis not present

## 2022-11-12 ENCOUNTER — Telehealth: Payer: Self-pay

## 2022-11-12 NOTE — Telephone Encounter (Signed)
-----   Message from Gertrude Bucks J Beavers sent at 11/09/2022  4:16 PM EDT ----- Regarding: FW: Visco This is a Research scientist (life sciences) request for you. ----- Message ----- From: Ellis Savage, LAT Sent: 11/09/2022   2:23 PM EDT To: Darianna Amy J Beavers Subject: Visco                                          Can we please get authorization for his right knee please.  Thank you

## 2022-11-12 NOTE — Telephone Encounter (Signed)
VOB submitted for Orthovisc, right knee   

## 2022-11-15 ENCOUNTER — Telehealth: Payer: Self-pay

## 2022-11-15 ENCOUNTER — Encounter: Payer: Self-pay | Admitting: Sports Medicine

## 2022-11-15 DIAGNOSIS — M1711 Unilateral primary osteoarthritis, right knee: Secondary | ICD-10-CM

## 2022-11-15 NOTE — Telephone Encounter (Signed)
Called and left a VM for patient to CB to schedule for gel injection with Dr. Shon Baton.   See referral tab

## 2022-11-18 ENCOUNTER — Encounter: Payer: Self-pay | Admitting: Sports Medicine

## 2022-11-18 ENCOUNTER — Ambulatory Visit: Payer: Medicare Other | Admitting: Sports Medicine

## 2022-11-18 DIAGNOSIS — M4316 Spondylolisthesis, lumbar region: Secondary | ICD-10-CM | POA: Diagnosis not present

## 2022-11-18 DIAGNOSIS — M48061 Spinal stenosis, lumbar region without neurogenic claudication: Secondary | ICD-10-CM | POA: Diagnosis not present

## 2022-11-18 DIAGNOSIS — M1711 Unilateral primary osteoarthritis, right knee: Secondary | ICD-10-CM

## 2022-11-18 DIAGNOSIS — M25519 Pain in unspecified shoulder: Secondary | ICD-10-CM | POA: Diagnosis not present

## 2022-11-18 MED ORDER — HYALURONAN 30 MG/2ML IX SOSY
30.0000 mg | PREFILLED_SYRINGE | INTRA_ARTICULAR | Status: AC | PRN
Start: 2022-11-18 — End: 2022-11-18
  Administered 2022-11-18: 30 mg via INTRA_ARTICULAR

## 2022-11-18 NOTE — Progress Notes (Signed)
Peter [name] - 89 y.o. male MRN [id number]  Date of birth: [birth year]  Office Visit Note: Visit Date: 11/18/2022 PCP: Joselyn Arrow, MD Referred by: Joselyn Arrow, MD  Subjective: Chief Complaint  Patient presents with   Right Knee - Pain   HPI: Peter [name] is a pleasant 89 y.o. male who presents today for right knee pain; low back pain/balance issues.  Chronic right knee pain -treating with known arthritic change and possible suspicion, possible subchondral insufficiency.  Patient and his son are not interested in advanced imaging such as MRI.  Wanting to proceed with viscosupplementation injections.  Balance issues/low back pain -he has had these issues for the past few years, although you are worsening over the last few months.  He does not have significant back pain but when he is walking or standing for longer periods of time he feels discomfort in the back and weakness in the legs.  He does do physical therapy based exercises at [employer] once weekly.  States when he is shopping and he leans forward on the grocery cart this will help his symptoms.  Pertinent ROS were reviewed with the patient and found to be negative unless otherwise specified above in HPI.   Assessment & Plan: Visit Diagnoses:  1. Spinal stenosis of lumbar region without neurogenic claudication   2. Anterolisthesis of lumbar spine   3. Unilateral primary osteoarthritis, right knee    Plan: Discussed with Peter [name] and his son today options for his low back pain, I did have discussion that I think his symptoms are quite suggestive of lumbar spinal stenosis.  His issue is not so much pain, but more low back and leg weakness/fatigability with prolonged standing, relieved with bending forward. He is not interested in PT. He is not interested in getting an MRI or any surgical options at this point.  He was questioning whether an injection would help.  Discussed I would send him to my partner, Dr. Alvester [name], for an  evaluation first to see if he thinks a lumbar ESI (? Transforaminal injection) would be helpful for his symptoms.  He does have anterior listhesis of L4 on L5, potentially this is where his pathology is. Peter [name] does not prefer an MRI, but will leave it up to Dr. Alvester [name] to see if he would require this prior to an injection.  He will follow-up with me next week for the second series of the Monovisc injections.  May use ice heat or Tylenol for any postinjection pain.  Follow-up: Return in about 1 week (around 11/25/2022) for for orthovis injection right knee (15-min).   Meds & Orders: No orders of the defined types were placed in this encounter.   Orders Placed This Encounter  Procedures   Large Joint Inj: R knee   Ambulatory referral to Physical Medicine Rehab     Procedures: Large Joint Inj: R knee on 11/18/2022 9:52 AM Indications: pain Details: 22 G 1.5 in needle, anterolateral approach Medications: 30 mg Hyaluronan 30 MG/2ML Outcome: tolerated well, no immediate complications Procedure, treatment alternatives, risks and benefits explained, specific risks discussed. Consent was given by the patient. Immediately prior to procedure a time out was called to verify the correct patient, procedure, equipment, support staff and site/side marked as required. Patient was prepped and draped in the usual sterile fashion.          Clinical History: No specialty comments available.  He reports that he quit smoking about 48 years ago. His smoking use included  cigarettes. He has never used smokeless tobacco.  Recent Labs    04/13/22 1114  HGBA1C 7.0*    Objective:    Physical Exam  Gen: Well-appearing, in no acute distress; non-toxic CV: Well-perfused. Warm.  Resp: Breathing unlabored on room air; no wheezing. Psych: Fluid speech in conversation; appropriate affect; normal thought process Neuro: Sensation intact throughout. No gross coordination deficits.   Ortho Exam - Right knee: Trace  effusion.  Positive TTP over the lateral and medial joint line upon palpation.  Range of motion 0-125 degrees.  Neurovascular intact distally.  - Low back: No specific midline spinous process TTP, some mild surrounding paraspinal musculature around L5 region.  There is active and passive flexion and extension with no significant pain.  Negative modified slump's test.  Patient does walk with the assistance of a cane.  Imaging: XR Lumbar Spine Complete W/Bend 4 views of the lumbar spine including AP, lateral and flexion and  extension views were ordered and reviewed by myself.  X-rays show  spondylosis of the lumbar spine that is moderate to severe at the L1-L3  level and severe at the L5-S1 level.  There is anterior listhesis of L4 on  L5 that is worsened with flexion/extension, grade 1-2.  No acute fracture  noted.   Past Medical/Family/Surgical/Social History: Medications & Allergies reviewed per EMR, new medications updated. Patient Active Problem List   Diagnosis Date Noted   Stage 3 chronic kidney disease 04/16/2022   Fatigue 02/22/2022   Bilateral carotid artery disease 02/22/2022   Acute pain of right foot 09/17/2021   Traumatic partial tear of right biceps tendon 06/30/2021   Rupture of pectoralis major muscle 06/30/2021   Greater trochanteric pain syndrome 01/24/2018   Hemiparesis affecting right side as late effect of cerebrovascular accident 06/15/2017   Mixed hyperlipidemia 03/30/2017   Rotator cuff tear arthropathy of right shoulder 04/14/2016   Hemifacial spasm 02/12/2016   Abnormality of gait 02/12/2016   Increased prostate specific antigen (PSA) velocity 01/07/2016   Type 2 diabetes mellitus without complication ([facility]) 12/18/2014   Full thickness rotator cuff tear, Left Shoulder 12/03/2014   Injury of left shoulder 08/22/2014   Facial nerve spasm 08/13/2014   Restlessness and agitation 08/13/2014   Allergic rhinitis 11/22/2012   Chest tightness 11/07/2012    Controlled type 2 diabetes mellitus with complication, without long-term current use of insulin 01/21/2011   Pure hypercholesterolemia 01/21/2011   Essential hypertension 01/21/2011   Erectile dysfunction 01/21/2011   Past Medical History:  Diagnosis Date   Adenomatous colon polyp 07/2013   Asthma h/o as a child-s/p immunotherapy   Diabetes mellitus diet controlled   Facial nerve spasm 08/13/2014   Fatigue 02/22/2022   FHx: colon cancer    Hiatal hernia    Hypercholesteremia    Hypertension age 89   Internal hemorrhoids 07/2013   noted on colonoscopy   Restlessness and agitation 08/13/2014   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 09/18/2019   Dr.Whitfield   Family History  Problem Relation Age of Onset   Alcohol abuse Father    Cancer Sister        stomach/?pancreatic   Colon cancer Brother [id number]   Cancer Brother        colon cancer in mid 7460's   Arthritis Daughter        psoriatic   Hyperlipidemia Son    Arthritis Daughter        psoriatic   Hyperlipidemia Son  Diabetes Maternal Grandmother    Cancer Sister        lung and brain   COPD Brother    Stroke Neg Hx    Heart disease Neg Hx    Past Surgical History:  Procedure Laterality Date   APPENDECTOMY     BLEPHAROPLASTY Bilateral 12/13/2016   CARDIOVASCULAR STRESS TEST  normal   CATARACT EXTRACTION, BILATERAL  07/2014, 08/2014   COLONOSCOPY  3/08, 07/2013   no repeat rec due to age per Dr. 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

## 2022-11-25 ENCOUNTER — Encounter: Payer: Self-pay | Admitting: Sports Medicine

## 2022-11-25 ENCOUNTER — Ambulatory Visit: Payer: Medicare Other | Admitting: Sports Medicine

## 2022-11-25 DIAGNOSIS — M1711 Unilateral primary osteoarthritis, right knee: Secondary | ICD-10-CM | POA: Diagnosis not present

## 2022-11-25 DIAGNOSIS — M25519 Pain in unspecified shoulder: Secondary | ICD-10-CM | POA: Diagnosis not present

## 2022-11-25 MED ORDER — HYALURONAN 30 MG/2ML IX SOSY
30.0000 mg | PREFILLED_SYRINGE | INTRA_ARTICULAR | Status: AC | PRN
Start: 2022-11-25 — End: 2022-11-25
  Administered 2022-11-25: 30 mg via INTRA_ARTICULAR

## 2022-11-25 NOTE — Progress Notes (Signed)
Has not noticed much difference since first one

## 2022-11-25 NOTE — Progress Notes (Signed)
   Procedure Note  Patient: Peter [name]             Date of Birth: 03-Nov-1929           MRN: [id number]             Visit Date: 11/25/2022  Procedures: Visit Diagnoses:  1. Unilateral primary osteoarthritis, right knee    Large Joint Inj: R knee on 11/25/2022 9:26 AM Details: 22 G 1.5 in needle, anterolateral approach Medications: 30 mg Hyaluronan 30 MG/2ML Outcome: tolerated well, no immediate complications Procedure, treatment alternatives, risks and benefits explained, specific risks discussed. Consent was given by the patient. Immediately prior to procedure a time out was called to verify the correct patient, procedure, equipment, support staff and site/side marked as required. Patient was prepped and draped in the usual sterile fashion.     -Tolerated the injection well, may use ice or over-the-counter anti-inflammatories as needed -He will follow-up next week for his third of 3-series of Orthovisc. We will also re-evaluate his back and stenosis at that time. Last visit did send referral to Dr. Alvester Morin for evaluation of possible lumbar ESI  Madelyn Brunner, DO Primary Care Sports Medicine Physician  [facility] - Orthopedics  This note was dictated using Dragon naturally speaking software and may contain errors in syntax, spelling, or content which have not been identified prior to signing this note.

## 2022-11-30 ENCOUNTER — Encounter: Payer: Self-pay | Admitting: Physical Medicine and Rehabilitation

## 2022-11-30 ENCOUNTER — Ambulatory Visit: Payer: Medicare Other | Admitting: Physical Medicine and Rehabilitation

## 2022-11-30 DIAGNOSIS — M4316 Spondylolisthesis, lumbar region: Secondary | ICD-10-CM

## 2022-11-30 DIAGNOSIS — M5416 Radiculopathy, lumbar region: Secondary | ICD-10-CM

## 2022-11-30 DIAGNOSIS — M47816 Spondylosis without myelopathy or radiculopathy, lumbar region: Secondary | ICD-10-CM

## 2022-11-30 NOTE — Progress Notes (Signed)
Functional Pain Scale - descriptive words and definitions  Uncomfortable (3)  Pain is present but can complete all ADL's/sleep is slightly affected and passive distraction only gives marginal relief. Mild range order  Average Pain 2 A little uncomforatble back pain.  Had xrays.  Patient says it is not a pain issue, but a discomfort and fatigue issue. Does not want a MRI.

## 2022-11-30 NOTE — Progress Notes (Signed)
Peter [name] - 89 y.o. male MRN [id number]  Date of birth: 1929/09/29  Office Visit Note: Visit Date: 11/30/2022 PCP: Joselyn Arrow, MD Referred by: Joselyn Arrow, MD  Subjective: Chief Complaint  Patient presents with   Lower Back - Pain   HPI: Peter [name] is a 89 y.o. male who comes in today per the request of Dr. Madelyn Brunner for evaluation of chronic, worsening and severe discomfort to bilateral lower back and buttocks. He does not refer to lower back issues as pain, rather weakness sensation, ongoing for several years.  Patient's son is accompanying him during our visit today.  He describes this weakness as burning sensation, currently rates as 7 out of 10. Pain worsens with prolonged standing and walking. Some relief of pain with rest and use of medications. Pain improves drastically with sitting. Dr. Shon Baton recently started patient on 100 mg of Gabapentin, however he stopped taking after 1 week because he felt this medication was not helping. Recent lumbar x-rays exhibits grade 1 anterolisthesis of L4 on L5.  No prior history of lumbar MRI/CT scan. No history of lumbar surgery/injections. Patient does report history of balance issues. Has completed regimen of neuro rehab in the past with minimal relief of  pain. Currently being managed by Dr. Levert Feinstein with [facility] for hemifacial spasms. Patient does ambulate with cane. Patient denies recent trauma or falls.    Review of Systems  Musculoskeletal:  Positive for back pain.  Neurological:  Positive for tingling. Negative for weakness.  All other systems reviewed and are negative.  Otherwise per HPI.  Assessment & Plan: Visit Diagnoses:    ICD-10-CM   1. Lumbar radiculopathy  M54.16 Ambulatory referral to Physical Medicine Rehab    2. Anterolisthesis of lumbar spine  M43.16 Ambulatory referral to Physical Medicine Rehab    3. Facet arthropathy, lumbar  M47.816        Plan: Findings:  Chronic,  worsening and severe discomfort to bilateral lower back and buttocks.  Patient continues to have severe discomfort despite good conservative therapies such as home exercise regimen, rest and use of medications.  Patient's clinical presentation and exam are consistent with neurogenic claudication as a result of spinal canal stenosis, however we do not have advanced imaging to reference.  His exam is non focal, good strength noted to bilateral lower extremities, no foot drop.  Next step is to perform diagnostic and hopefully therapeutic bilateral L4 transforaminal epidural steroid injection under fluoroscopic guidance.  I discussed lumbar epidural steroid injection procedure in detail with patient and his son today.  The patient gets good relief of pain with injection we can repeat this procedure infrequently as needed.  If his pain persists we would need to consider advanced imaging of the lumbar spine such as CT scan or MRI. Would consult with Dr. Colletta Maryland. Shon Baton regarding this. Patient voiced to me that he is extremely claustrophobic even with oral preprocedure sedation.  He would like to avoid MRI imaging if all possible.  No red flag symptoms noted upon exam today.    Meds & Orders: No orders of the defined types were placed in this encounter.   Orders Placed This Encounter  Procedures   Ambulatory referral to Physical Medicine Rehab    Follow-up: Return for Bilateral L4 transforaminal epidural steroid injection.   Procedures: No procedures performed      Clinical History: No specialty comments available.   He reports that he quit smoking about 48 years ago.  His smoking use included cigarettes. He has never used smokeless tobacco.  Recent Labs    04/13/22 1114  HGBA1C 7.0*    Objective:  VS:  HT:    WT:   BMI:     BP:   HR: bpm  TEMP: ( )  RESP:  Physical Exam Vitals and nursing note reviewed.  HENT:     Head: Normocephalic and atraumatic.     Right Ear: External ear normal.      Left Ear: External ear normal.     Mouth/Throat:     Mouth: Mucous membranes are moist.  Eyes:     Pupils: Pupils are equal, round, and reactive to light.  Cardiovascular:     Rate and Rhythm: Normal rate.     Pulses: Normal pulses.  Pulmonary:     Effort: Pulmonary effort is normal.  Abdominal:     General: Abdomen is flat. There is no distension.  Musculoskeletal:        General: Tenderness present.     Cervical back: Normal range of motion.     Comments: Patient rises from seated position to standing without difficulty. Good lumbar range of motion. No pain noted with facet loading. 5/5 strength noted with bilateral hip flexion, knee flexion/extension, ankle dorsiflexion/plantarflexion and EHL. No clonus noted bilaterally. No pain upon palpation of greater trochanters. No pain with internal/external rotation of bilateral hips. Sensation intact bilaterally. Negative slump test bilaterally. Ambulates with cane, gait unsteady.   Skin:    General: Skin is warm and dry.     Capillary Refill: Capillary refill takes less than 2 seconds.  Neurological:     Mental Status: He is alert and oriented to person, place, and time.     Gait: Gait abnormal.  Psychiatric:        Mood and Affect: Mood normal.        Behavior: Behavior normal.     Ortho Exam  Imaging: No results found.  Past Medical/Family/Surgical/Social History: Medications & Allergies reviewed per EMR, new medications updated. Patient Active Problem List   Diagnosis Date Noted   Stage 3 chronic kidney disease 04/16/2022   Fatigue 02/22/2022   Bilateral carotid artery disease 02/22/2022   Acute pain of right foot 09/17/2021   Traumatic partial tear of right biceps tendon 06/30/2021   Rupture of pectoralis major muscle 06/30/2021   Greater trochanteric pain syndrome 01/24/2018   Hemiparesis affecting right side as late effect of cerebrovascular accident 06/15/2017   Mixed hyperlipidemia 03/30/2017   Rotator cuff tear  arthropathy of right shoulder 04/14/2016   Hemifacial spasm 02/12/2016   Abnormality of gait 02/12/2016   Increased prostate specific antigen (PSA) velocity 01/07/2016   Type 2 diabetes mellitus without complication (HCC) 12/18/2014   Full thickness rotator cuff tear, Left Shoulder 12/03/2014   Injury of left shoulder 08/22/2014   Facial nerve spasm 08/13/2014   Restlessness and agitation 08/13/2014   Allergic rhinitis 11/22/2012   Chest tightness 11/07/2012   Controlled type 2 diabetes mellitus with complication, without long-term current use of insulin 01/21/2011   Pure hypercholesterolemia 01/21/2011   Essential hypertension 01/21/2011   Erectile dysfunction 01/21/2011   Past Medical History:  Diagnosis Date   Adenomatous colon polyp 07/2013   Asthma h/o as a child-s/p immunotherapy   Diabetes mellitus diet controlled   Facial nerve spasm 08/13/2014   Fatigue 02/22/2022   FHx: colon cancer    Hiatal hernia    Hypercholesteremia    Hypertension age 64  Internal hemorrhoids 07/2013   noted on colonoscopy   Restlessness and agitation 08/13/2014   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 09/18/2019   Dr.Whitfield   Family History  Problem Relation Age of Onset   Alcohol abuse Father    Cancer Sister        stomach/?pancreatic   Colon cancer Brother 47   Cancer Brother        colon cancer in mid 59's   Arthritis Daughter        psoriatic   Hyperlipidemia Son    Arthritis Daughter        psoriatic   Hyperlipidemia Son    Diabetes Maternal Grandmother    Cancer Sister        lung and brain   COPD Brother    Stroke Neg Hx    Heart disease Neg Hx    Past Surgical History:  Procedure Laterality Date   APPENDECTOMY     BLEPHAROPLASTY Bilateral 12/13/2016   CARDIOVASCULAR STRESS TEST  normal   CATARACT EXTRACTION, BILATERAL  07/2014, 08/2014   COLONOSCOPY  3/08, 07/2013   no repeat rec due to age per Dr. 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

## 2022-12-02 ENCOUNTER — Encounter: Payer: Self-pay | Admitting: Sports Medicine

## 2022-12-02 ENCOUNTER — Ambulatory Visit: Payer: Medicare Other | Admitting: Sports Medicine

## 2022-12-02 DIAGNOSIS — M1711 Unilateral primary osteoarthritis, right knee: Secondary | ICD-10-CM | POA: Diagnosis not present

## 2022-12-02 DIAGNOSIS — M4316 Spondylolisthesis, lumbar region: Secondary | ICD-10-CM | POA: Diagnosis not present

## 2022-12-02 DIAGNOSIS — M5416 Radiculopathy, lumbar region: Secondary | ICD-10-CM

## 2022-12-02 MED ORDER — HYALURONAN 30 MG/2ML IX SOSY
30.0000 mg | PREFILLED_SYRINGE | INTRA_ARTICULAR | Status: AC | PRN
Start: 2022-12-02 — End: 2022-12-02
  Administered 2022-12-02: 30 mg via INTRA_ARTICULAR

## 2022-12-02 NOTE — Progress Notes (Signed)
Peter [name] - 89 y.o. male MRN [id number]  Date of birth: 10/06/1929  Office Visit Note: Visit Date: 12/02/2022 PCP: Joselyn Arrow, MD Referred by: Joselyn Arrow, MD  Subjective: Chief Complaint  Patient presents with   Right Knee - Follow-up   HPI: Peter [name] is a pleasant 89 y.o. male who presents today for follow-up of his low back pain with radiculopathy; also here for 3rd of 3 orthovisc injections.  Saw my partner, Ellin Goodie NP for his lumbar radiculopathy - plans to return with Dr Alvester Morin for Bilateral L4 transforaminal epidural steroid injection.  Still has some lower leg balance issues and discomfort in the low back.  Currently stopped with the gabapentin.  Right knee -has had 2 Orthovisc injections, does feel like he is starting to notice some of the difference.   Pertinent ROS were reviewed with the patient and found to be negative unless otherwise specified above in HPI.   Assessment & Plan: Visit Diagnoses:  1. Unilateral primary osteoarthritis, right knee   2. Lumbar radiculopathy   3. Anterolisthesis of lumbar spine    Plan: Did proceed with #3 of 3 Orthovisc injections into the right knee today, he is finding improvement.  We also discussed treatment options for his low back pain with radiculopathy as well as his anterior listhesis.  He had his initial evaluation with Ellin Goodie, he does think based on their recommendation and at my discretion that he may benefit from a trial of a transforaminal ESI.  Once insurance improves this, he will follow-up to get that scheduled.  I would like to follow-up with him about 1 month after that to see what sort of benefit he gets from this and his gait as well as see how the knee is doing after the Orthovisc injections.  He is not interested in formalized physical therapy, but will remain staying active with walking at home. May use OTC anti-inflammatories as needed.  Follow-up: Return for f/u 44-month after spine  injections.   Meds & Orders: No orders of the defined types were placed in this encounter.   Orders Placed This Encounter  Procedures   Large Joint Inj: R knee     Procedures: Large Joint Inj: R knee on 12/02/2022 9:20 AM Indications: pain Details: 22 G 1.5 in needle, anterolateral approach Medications: 30 mg Hyaluronan 30 MG/2ML Outcome: tolerated well, no immediate complications  Knee Injection, right: After discussion on risks/benefits/indications, informed verbal consent was obtained and a timeout was performed, patient was seated on exam table. The patient's knee was prepped with Betadine and alcohol swab and utilizing anterolateral approach, the patient's knee was injected intraarticularly with 30 mg Hyaluronan 30 MG/2ML. Patient tolerated the procedure well without immediate complications.  Procedure, treatment alternatives, risks and benefits explained, specific risks discussed. Consent was given by the patient. Immediately prior to procedure a time out was called to verify the correct patient, procedure, equipment, support staff and site/side marked as required. Patient was prepped and draped in the usual sterile fashion.          Clinical History: No specialty comments available.  He reports that he quit smoking about 48 years ago. His smoking use included cigarettes. He has never used smokeless tobacco.  Recent Labs    04/13/22 1114  HGBA1C 7.0*    Objective:   Vital Signs: There were no vitals taken for this visit.  Physical Exam  Gen: Well-appearing, in no acute distress; non-toxic CV: Regular Rate. Well-perfused. Warm.  Resp: Breathing unlabored on room air; no wheezing. Psych: Fluid speech in conversation; appropriate affect; normal thought process Neuro: Sensation intact throughout. No gross coordination deficits.   Ortho Exam - Right knee: Trace effusion of the knee without warmth or redness.  Mild TTP over the medial joint line.  Neurovascular intact  distally.  - Lumbar spine: No specific midline spinous process TTP.  There is passive and active flexion and extension without much pain.  Does walk with the assistance of a cane.  Imaging: XR Lumbar Spine Complete W/Bend 4 views of the lumbar spine including AP, lateral and flexion and  extension views were ordered and reviewed by myself.  X-rays show  spondylosis of the lumbar spine that is moderate to severe at the L1-L3  level and severe at the L5-S1 level.  There is anterior listhesis of L4 on  L5 that is worsened with flexion/extension, grade 1-2.  No acute fracture  noted.    Past Medical/Family/Surgical/Social History: Medications & Allergies reviewed per EMR, new medications updated. Patient Active Problem List   Diagnosis Date Noted   Stage 3 chronic kidney disease 04/16/2022   Fatigue 02/22/2022   Bilateral carotid artery disease 02/22/2022   Acute pain of right foot 09/17/2021   Traumatic partial tear of right biceps tendon 06/30/2021   Rupture of pectoralis major muscle 06/30/2021   Greater trochanteric pain syndrome 01/24/2018   Hemiparesis affecting right side as late effect of cerebrovascular accident 06/15/2017   Mixed hyperlipidemia 03/30/2017   Rotator cuff tear arthropathy of right shoulder 04/14/2016   Hemifacial spasm 02/12/2016   Abnormality of gait 02/12/2016   Increased prostate specific antigen (PSA) velocity 01/07/2016   Type 2 diabetes mellitus without complication (HCC) 12/18/2014   Full thickness rotator cuff tear, Left Shoulder 12/03/2014   Injury of left shoulder 08/22/2014   Facial nerve spasm 08/13/2014   Restlessness and agitation 08/13/2014   Allergic rhinitis 11/22/2012   Chest tightness 11/07/2012   Controlled type 2 diabetes mellitus with complication, without long-term current use of insulin 01/21/2011   Pure hypercholesterolemia 01/21/2011   Essential hypertension 01/21/2011   Erectile dysfunction 01/21/2011   Past Medical History:   Diagnosis Date   Adenomatous colon polyp 07/2013   Asthma h/o as a child-s/p immunotherapy   Diabetes mellitus diet controlled   Facial nerve spasm 08/13/2014   Fatigue 02/22/2022   FHx: colon cancer    Hiatal hernia    Hypercholesteremia    Hypertension age 75   Internal hemorrhoids 07/2013   noted on colonoscopy   Restlessness and agitation 08/13/2014   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 09/18/2019   Dr.Whitfield   Family History  Problem Relation Age of Onset   Alcohol abuse Father    Cancer Sister        stomach/?pancreatic   Colon cancer Brother 69   Cancer Brother        colon cancer in mid 85's   Arthritis Daughter        psoriatic   Hyperlipidemia Son    Arthritis Daughter        psoriatic   Hyperlipidemia Son    Diabetes Maternal Grandmother    Cancer Sister        lung and brain   COPD Brother    Stroke Neg Hx    Heart disease Neg Hx    Past Surgical History:  Procedure Laterality Date   APPENDECTOMY     BLEPHAROPLASTY Bilateral 12/13/2016  CARDIOVASCULAR STRESS TEST  normal   CATARACT EXTRACTION, BILATERAL  07/2014, 08/2014   COLONOSCOPY  3/08, 07/2013   no repeat rec due to age per Dr. 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

## 2022-12-02 NOTE — Progress Notes (Signed)
Doing good; can't tell much of a difference yet with injection series but is hopeful

## 2022-12-05 NOTE — Progress Notes (Unsigned)
No chief complaint on file.   Peter [name] is a 89 y.o. male who presents for a Medicare Annual Wellness visit and follow up on chronic problems.  Diabetes--this has always been diet controlled, never took meds.  He doesn't monitor blood sugars. He really never watched his diet too carefully, until he saw that he was "obese" on a weight chart.  6 months ago he started watching his diet more carefully.  His A1c was up to 7% at his physical in September.  He opted for a trial of diet, rather than starting medications. He has lost weight, and now tries to follow a low carb diet.  Alcohol?  Denies polydipsia or polyuria. No retinopathy on diabetic eye exam done 02/2022. He checks his feet regularly and denies concerns.  Component Ref Range & Units 7 mo ago (04/13/22) 1 yr ago (07/27/21) 1 yr ago (04/27/21) 2 yr ago (03/24/20) 3 yr ago (09/06/19) 3 yr ago (02/13/19) 4 yr ago (02/13/18)  Hgb A1c MFr Bld 4.8 - 5.6 % 7.0 High  6.6 Abnormal  R 6.8 Abnormal  R 6.4 Abnormal  R 6.4 Abnormal  R 6.2 High  CM 6.5 Abnormal  R    CKD:  Cr had bumped up to 1.68 in 04/2022.  He discontinued Celebrex, and Cr went back to baseline of 1.3 range.  On last check, K+ was elevated.   Chemistry      Component Value Date/Time   NA 140 10/05/2022 0829   K 5.7 (H) 10/05/2022 0829   CL 104 10/05/2022 0829   CO2 21 10/05/2022 0829   BUN 25 10/05/2022 0829   CREATININE 1.33 (H) 10/05/2022 0829   CREATININE 1.23 (H) 12/17/2016 0722      Component Value Date/Time   CALCIUM 9.3 10/05/2022 0829   ALKPHOS 66 04/13/2022 1114   AST 23 04/13/2022 1114   ALT 19 04/13/2022 1114   BILITOT 0.9 04/13/2022 1114       Hypertension, hyperlipidemia, elevated coronary calcium score and mild carotid artery disease.  He had been under the care of Dr. Elease Hashimoto, now sees Dr. Duke Salvia. He had negative stress test 02/2022 (done for constricting feeling in chest when walking up hill of driveway). He has longstanding chest discomfort  periodically, related to hiatal hernia (and relieved by Tums), after spicy foods, alcohol tomatoes. His BP's had remained elevated; Dr. Duke Salvia switched to extended release carvedilol, but still was above goal, and poss worsened fatigue.  Carvedilol was stopped, and switched to 5mg  of amlodipine. He continues on Losartan.  He has noted higher pulses since the change in CCB, but no tachycardia, palpitations. He has f/u appt with Dr. Duke Salvia in May.  BP's at home have been running  If he stands up quickly he feels dizzy (for many years).   BP Readings from Last 3 Encounters:  07/27/22 (!) 150/62  05/21/22 (!) 162/60  04/14/22 (!) 156/64   Mild carotid artery disease. Carotid US 03/2022 with 1-39% stenosis bilaterally.  He continues to take aspirin and atorvastatin, and plan is to repeat carotid Dopplers in 1 year. He denies any neurologic symptoms, other than mild balance problems, which have remained stable.     Hyperlipidemia: He is compliant with 20mg  of atorvastatin daily.   Lab Results  Component Value Date   CHOL 152 04/13/2022   HDL 54 04/13/2022   LDLCALC 82 04/13/2022   TRIG 83 04/13/2022   CHOLHDL 2.8 04/13/2022    Subclinical hypothyroidism.  He has some chronic  mild fatigue. Denies changes to hair/skin/nails/moods/bowels or temperature intolerance.  Component Ref Range & Units 9 mo ago 1 yr ago 2 yr ago 3 yr ago 4 yr ago 5 yr ago 6 yr ago  TSH 0.450 - 4.500 uIU/mL 4.510 High  5.070 High  7.160 High  5.060 High  4.360 6.38 High  R 3.76 R    COPD/asthma and uses Anoro daily (at least 5x/week). Since taking the Anoro more regularly, he hasn't needed any albuterol in a long time.  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     Allergies are controlled with flonase daily, and antihistamines prn. He hasn't needed any antihistamines in a while. No longer wakes up with a stuffy nose.   R hemifacial spasm, controlled with Xeomin  injections.  Chronic R shoulder pain--known torn RC tendons, mild OA.  He does well with periodic injections, last was 08/2022 (and prior was 03/2022).  He no longer takes Celebrex (due to CKD, CAD).  Tart cherry juice? Topical meds?  Right knee pain--s/p series of 3 orthovisc injections (last was last week), with Dr. Shon Baton. Low back pain with radiculopathy and anteriolisthesis (L4 on L5)--planning for transforaminal epidural steroid injection (waiting for insurance approval). He reports bilateral lower back burning discomfort, into the buttocks. He tried gabapentin (100mg ), reportedly stopped after a week due to being ineffective.    Immunization History  Administered Date(s) Administered   COVID-19, mRNA, vaccine(Comirnaty)12 years and older 10/29/2022   Fluad Quad(high Dose 65+) 05/29/2019, 04/09/2020, 04/27/2021, 05/07/2022   Influenza Split 05/06/2011, 05/25/2012   Influenza, High Dose Seasonal PF 05/21/2013, 05/22/2014, 06/25/2015, 04/15/2016, 05/04/2017, 07/19/2018   Moderna Covid-19 Vaccine Bivalent Booster 11yrs & up 09/07/2021   Moderna SARS-COV2 Booster Vaccination 06/19/2020   Moderna Sars-Covid-2 Vaccination 08/23/2019, 09/20/2019   Pneumococcal Conjugate-13 11/28/2013   Pneumococcal Polysaccharide-23 08/09/2006, 04/15/2016   Respiratory Syncytial Virus Vaccine,Recomb Aduvanted(Arexvy) 06/28/2022   Tdap 05/06/2011, 09/08/2022   Zoster Recombinat (Shingrix) 11/21/2019, 03/11/2020   Zoster, Live 06/01/2012   Last colonoscopy: 07/2013, Dr. Randa Evens, tubular adenoma Last PSA:  2.4 in 12/2016 Dentist: twice yearly   Ophtho: regularly Sees dermatologist twice yearly Exercise: walking the dog, 10 minutes 3/day. Goes to the gym for "body structure manipulation" once a week. No longer using his bike. Does some yardwork.   Patient Care Team: Joselyn Arrow, MD as PCP - General (Family Medicine) Chilton Si, MD as PCP - Cardiology (Cardiology) Enid Baas, MD as Consulting  Physician (Sports Medicine) Drema Halon, MD (Inactive) as Consulting Physician (Otolaryngology) Dohmeier, Porfirio Mylar, MD as Consulting Physician (Neurology) Levert Feinstein, MD as Consulting Physician (Neurology) Janet Berlin, MD as Consulting Physician (Ophthalmology) Aris Lot, MD as Consulting Physician (Dermatology) Carman Ching, MD (Inactive) as Consulting Physician (Gastroenterology) Chilton Si, MD as Attending Physician (Cardiology) [place] for hearing loss  Dr. Madelyn Brunner (ortho)   Depression Screening: Flowsheet Row Office Visit from 04/14/2022 in [facility]  PHQ-2 Total Score 0        Falls screen:     04/14/2022    9:56 AM 07/27/2021    2:40 PM 04/27/2021    1:43 PM 03/24/2020    3:03 PM 02/15/2019    9:19 AM  Fall Risk   Falls in the past year? 1 0 0 0 0  Number falls in past yr: 0 0 0    Comment 3 weeks ago tripped when he got out of bed      Injury with Fall? 0 0  0    Risk for fall due to : No Fall Risks No Fall Risks No Fall Risks    Follow up Falls evaluation completed Falls evaluation completed Falls evaluation completed       Functional Status Survey:    Might forget why he walked into a room.  No other memory concerns.         He has a living will and healthcare power of attorney     PMH, PSH, SH and FH were reviewed and updated    ROS: The patient denies anorexia, fever, headaches, ear pain, exertional chest pain, palpitations, syncope, dyspnea on exertion, swelling, nausea, vomiting, diarrhea, constipation, abdominal pain, melena, hematochezia, hematuria, incontinence, weakened urine stream, dysuria,  numbness, tingling, weakness, tremor, suspicious skin lesions, depression, anxiety, abnormal bleeding/bruising, or enlarged lymph nodes. No insomnia +stable hearing loss.   Some dizziness when he first stands up, unchanged, and some chronic balance issues. Nocturia x 3, unchanged. Rare indigestion, r/b Tums, after  triggering foods.  Sees dermatologist regularly (twice yearly) Hemifacial spasm improved with botox injections Fatigue--chronic, unchanged Breathing is stable.  Shoulder, knee, back pain??    PHYSICAL EXAM:   There were no vitals taken for this visit.   Wt Readings from Last 3 Encounters:  07/27/22 195 lb 6.4 oz (88.6 kg)  05/21/22 187 lb 14.4 oz (85.2 kg)  04/14/22 183 lb 9.6 oz (83.3 kg)     Well-appearing, pleasant, elderly male in no distress He is alert, oriented and in good spirits. Normal gait, and grossly normal strength.   2/6 SEM noted at RUSB  No facial twitching noted, but there was some asymmetry, with slight facial droop on the right (no change per pt)      ASSESSMENT/PLAN:   Dr. Duke Salvia is listed twice. Has appts scheduled in May with both Nahser and Duke Salvia (still seeing both?)  Prevnar-20 A1c   Consider c-met (elevated K)   Consider GI protecting meds d/t aspirin use    PSA not recommended based on age, recommended at least 30 minutes of aerobic activity at least 5 days/week, weight-bearing exercise at least 2x/week; proper sunscreen use reviewed; healthy diet and alcohol recommendations (less than or equal to 2 drinks/day) reviewed; regular seatbelt use; changing batteries in smoke detectors. Immunization recommendations discussed--continue yearly high dose flu shots. Prevnar-20 given today Colonoscopy recommendations reviewed; was due in 2019, but due to age and lack of sx, had been doing FIT testing, UTD (last done 04/2022)   F/u for CPE due Sept    Medicare Attestation I have personally reviewed: The patient's medical and social history Their use of alcohol, tobacco or illicit drugs Their current medications and supplements The patient's functional ability including ADLs,fall risks, home safety risks, cognitive, and hearing and visual impairment Diet and physical activities Evidence for depression or mood disorders   The patient's weight,  height and BMI have been recorded in the chart.  I have made referrals, counseling, and provided education to the patient based on review of the above and I have provided the patient with a written personalized care plan for preventive services.

## 2022-12-05 NOTE — Patient Instructions (Incomplete)
  Mr. Peter [name] , Thank you for taking time to come for your Medicare Wellness Visit. I appreciate your ongoing commitment to your health goals. Please review the following plan we discussed and let me know if I can assist you in the future.   This is a list of the screening recommended for you and due dates:  Health Maintenance  Topic Date Due   Hemoglobin A1C  10/12/2022   Eye exam for diabetics  02/11/2023   Flu Shot  03/10/2023   Complete foot exam   04/15/2023   Medicare Annual Wellness Visit  12/06/2023   DTaP/Tdap/Td vaccine (3 - Td or Tdap) 09/08/2032   Pneumonia Vaccine  Completed   COVID-19 Vaccine  Completed   Zoster (Shingles) Vaccine  Completed   HPV Vaccine  Aged Out    Ideally, to keep your diabetes the best controlled, you need to cut back on candy, carbs (including pretzels and beer), and work on losing some more weight.  You lose weight but cutting back on the beer, carbs and sweets (since your exercise is limited).  It would help if you didn't keep these things in the house. Cut back on the beer to either just Friday and Saturdays or maybe just one instead of 2.  Please drink more water. It can be any non-alcoholic, non-caffeinated beverage, but preferably one without sugar (ie lowfat milk, not chocolate milk).  I recommend taking either pepcid or Prilosec OTC every day,  in order to protect your stomach from the daily low dose aspirin that you take (risk for ulcers and GI bleed). Please do not take much ibuprofen--this also puts your stomach at risk, but is even worse for your kidneys. I prefer you to use the topical Voltaren gel (which is an anti-inflammatory that bypasses your kidneys and stomach). Tylenol Arthritis or extra strength tylenol are also safe for you to use (follow the directions on the bottle). Use tylenol instead of ibuprofen.  Please cut back on cheese, and limit your red meat (beef, pork and lamp)--less often and/or much smaller portions. This will  help get the LDL below 70, where I think your cardiologist would prefer for it to be. Otherwise we may need to increase the dose of the atorvastatin.

## 2022-12-06 ENCOUNTER — Telehealth: Payer: Self-pay | Admitting: Physical Medicine and Rehabilitation

## 2022-12-06 ENCOUNTER — Ambulatory Visit (INDEPENDENT_AMBULATORY_CARE_PROVIDER_SITE_OTHER): Payer: Medicare Other | Admitting: Family Medicine

## 2022-12-06 ENCOUNTER — Encounter: Payer: Self-pay | Admitting: Family Medicine

## 2022-12-06 VITALS — BP 128/60 | HR 84 | Ht 67.0 in | Wt 192.6 lb

## 2022-12-06 DIAGNOSIS — Z0000 Encounter for general adult medical examination without abnormal findings: Secondary | ICD-10-CM

## 2022-12-06 DIAGNOSIS — E875 Hyperkalemia: Secondary | ICD-10-CM

## 2022-12-06 DIAGNOSIS — N183 Chronic kidney disease, stage 3 unspecified: Secondary | ICD-10-CM | POA: Diagnosis not present

## 2022-12-06 DIAGNOSIS — E118 Type 2 diabetes mellitus with unspecified complications: Secondary | ICD-10-CM | POA: Diagnosis not present

## 2022-12-06 DIAGNOSIS — I779 Disorder of arteries and arterioles, unspecified: Secondary | ICD-10-CM | POA: Diagnosis not present

## 2022-12-06 DIAGNOSIS — Z23 Encounter for immunization: Secondary | ICD-10-CM

## 2022-12-06 DIAGNOSIS — J449 Chronic obstructive pulmonary disease, unspecified: Secondary | ICD-10-CM

## 2022-12-06 DIAGNOSIS — E1159 Type 2 diabetes mellitus with other circulatory complications: Secondary | ICD-10-CM

## 2022-12-06 DIAGNOSIS — J309 Allergic rhinitis, unspecified: Secondary | ICD-10-CM

## 2022-12-06 DIAGNOSIS — I10 Essential (primary) hypertension: Secondary | ICD-10-CM | POA: Diagnosis not present

## 2022-12-06 DIAGNOSIS — I152 Hypertension secondary to endocrine disorders: Secondary | ICD-10-CM

## 2022-12-06 DIAGNOSIS — E785 Hyperlipidemia, unspecified: Secondary | ICD-10-CM

## 2022-12-06 DIAGNOSIS — E1169 Type 2 diabetes mellitus with other specified complication: Secondary | ICD-10-CM

## 2022-12-06 LAB — POCT GLYCOSYLATED HEMOGLOBIN (HGB A1C): Hemoglobin A1C: 6.8 % — AB (ref 4.0–5.6)

## 2022-12-06 LAB — BASIC METABOLIC PANEL

## 2022-12-06 NOTE — Telephone Encounter (Signed)
Patient called. Returning a call to schedule with Dr. Newton.  ?

## 2022-12-07 ENCOUNTER — Encounter: Payer: Self-pay | Admitting: Family Medicine

## 2022-12-07 LAB — BASIC METABOLIC PANEL
BUN/Creatinine Ratio: 22 (ref 10–24)
BUN: 31 mg/dL (ref 10–36)
CO2: 20 mmol/L (ref 20–29)
Chloride: 104 mmol/L (ref 96–106)
Creatinine, Ser: 1.42 mg/dL — ABNORMAL HIGH (ref 0.76–1.27)
Glucose: 141 mg/dL — ABNORMAL HIGH (ref 70–99)
Potassium: 5.1 mmol/L (ref 3.5–5.2)
Sodium: 140 mmol/L (ref 134–144)
eGFR: 47 mL/min/{1.73_m2} — ABNORMAL LOW (ref >59)

## 2022-12-07 NOTE — Telephone Encounter (Signed)
Attempted to call patient and they were unable to hear

## 2022-12-08 ENCOUNTER — Encounter: Payer: Self-pay | Admitting: Sports Medicine

## 2022-12-09 DIAGNOSIS — M25519 Pain in unspecified shoulder: Secondary | ICD-10-CM | POA: Diagnosis not present

## 2022-12-10 ENCOUNTER — Other Ambulatory Visit: Payer: Self-pay | Admitting: Sports Medicine

## 2022-12-16 ENCOUNTER — Ambulatory Visit: Payer: Medicare Other | Admitting: Physical Medicine and Rehabilitation

## 2022-12-16 ENCOUNTER — Other Ambulatory Visit: Payer: Self-pay

## 2022-12-16 VITALS — BP 165/76 | HR 66

## 2022-12-16 DIAGNOSIS — M25519 Pain in unspecified shoulder: Secondary | ICD-10-CM | POA: Diagnosis not present

## 2022-12-16 DIAGNOSIS — M5416 Radiculopathy, lumbar region: Secondary | ICD-10-CM | POA: Diagnosis not present

## 2022-12-16 MED ORDER — METHYLPREDNISOLONE ACETATE 80 MG/ML IJ SUSP
80.0000 mg | Freq: Once | INTRAMUSCULAR | Status: AC
Start: 2022-12-16 — End: 2022-12-16
  Administered 2022-12-16: 80 mg

## 2022-12-16 NOTE — Patient Instructions (Signed)

## 2022-12-16 NOTE — Progress Notes (Signed)
Functional Pain Scale - descriptive words and definitions  Mild (2)   Noticeable when not distracted/no impact on ADL's/sleep only slightly affected and able to   use both passive and active distraction for comfort. Mild range order  Average Pain  varies, but walking can make it worse   +Driver, -BT, -Dye Allergies.  Lower back pain on both sides with pain in the bottom on the left foot

## 2022-12-17 ENCOUNTER — Telehealth: Payer: Self-pay | Admitting: Orthopedic Surgery

## 2022-12-17 ENCOUNTER — Other Ambulatory Visit: Payer: Self-pay | Admitting: Sports Medicine

## 2022-12-17 MED ORDER — GABAPENTIN 300 MG PO CAPS: 300.0000 mg | ORAL_CAPSULE | Freq: Every day | ORAL | 0 refills | Status: DC

## 2022-12-17 NOTE — Telephone Encounter (Signed)
Mr. Peter [name] is calling in to check on a refill of gabapentin that was supposed to be called in to [facility].  He called and they do not have anything, but stated the pharmacy had reached out to our office.  Peter [name] states that Dr. Shon Baton was changing his dosage from 100mg  to 200mg .  They can be reached at [phone].

## 2022-12-17 NOTE — Telephone Encounter (Signed)
Tried calling patient back; no answer LM

## 2022-12-23 DIAGNOSIS — M25519 Pain in unspecified shoulder: Secondary | ICD-10-CM | POA: Diagnosis not present

## 2022-12-28 DIAGNOSIS — L570 Actinic keratosis: Secondary | ICD-10-CM | POA: Diagnosis not present

## 2022-12-28 DIAGNOSIS — L821 Other seborrheic keratosis: Secondary | ICD-10-CM | POA: Diagnosis not present

## 2022-12-28 DIAGNOSIS — D0439 Carcinoma in situ of skin of other parts of face: Secondary | ICD-10-CM | POA: Diagnosis not present

## 2022-12-28 DIAGNOSIS — Z85828 Personal history of other malignant neoplasm of skin: Secondary | ICD-10-CM | POA: Diagnosis not present

## 2022-12-28 NOTE — Procedures (Signed)
Lumbosacral Transforaminal Epidural Steroid Injection - Sub-Pedicular Approach with Fluoroscopic Guidance  Patient: Peter [name]      Date of Birth: February 20, 1930 MRN: [id number] PCP: Joselyn Arrow, MD      Visit Date: 12/16/2022   Universal Protocol:    Date/Time: 12/16/2022  Consent Given By: the patient  Position: PRONE  Additional Comments: Vital signs were monitored before and after the procedure. Patient was prepped and draped in the usual sterile fashion. The correct patient, procedure, and site was verified.   Injection Procedure Details:   Procedure diagnoses: Lumbar radiculopathy [M54.16]    Meds Administered:  Meds ordered this encounter  Medications   methylPREDNISolone acetate (DEPO-MEDROL) injection 80 mg    Laterality: Bilateral  Location/Site: L4  Needle:5.0 in., 22 ga.  Short bevel or Quincke spinal needle  Needle Placement: Transforaminal  Findings:    -Comments: Excellent flow of contrast along the nerve, nerve root and into the epidural space.  Procedure Details: After squaring off the end-plates to get a true AP view, the C-arm was positioned so that an oblique view of the foramen as noted above was visualized. The target area is just inferior to the "nose of the scotty dog" or sub pedicular. The soft tissues overlying this structure were infiltrated with 2-3 ml. of 1% Lidocaine without Epinephrine.  The spinal needle was inserted toward the target using a "trajectory" view along the fluoroscope beam.  Under AP and lateral visualization, the needle was advanced so it did not puncture dura and was located close the 6 O'Clock position of the pedical in AP tracterory. Biplanar projections were used to confirm position. Aspiration was confirmed to be negative for CSF and/or blood. A 1-2 ml. volume of Isovue-250 was injected and flow of contrast was noted at each level. Radiographs were obtained for documentation purposes.   After attaining the desired  flow of contrast documented above, a 0.5 to 1.0 ml test dose of 0.25% Marcaine was injected into each respective transforaminal space.  The patient was observed for 90 seconds post injection.  After no sensory deficits were reported, and normal lower extremity motor function was noted,   the above injectate was administered so that equal amounts of the injectate were placed at each foramen (level) into the transforaminal epidural space.   Additional Comments:  No complications occurred Dressing: 2 x 2 sterile gauze and Band-Aid    Post-procedure details: Patient was observed during the procedure. Post-procedure instructions were reviewed.  Patient left the clinic in stable condition.

## 2022-12-28 NOTE — Progress Notes (Signed)
Peter [name] - 89 y.o. male MRN [id number]  Date of birth: 12/07/1929  Office Visit Note: Visit Date: 12/16/2022 PCP: Joselyn Arrow, MD Referred by: Joselyn Arrow, MD  Subjective: Chief Complaint  Patient presents with   Lower Back - Pain   HPI:  Peter [name] is a 89 y.o. male who comes in today at the request of Dr. Madelyn Brunner for planned Bilateral L4-5 Lumbar Transforaminal epidural steroid injection with fluoroscopic guidance.  The patient has failed conservative care including home exercise, medications, time and activity modification.  This injection will be diagnostic and hopefully therapeutic.  Please see requesting physician notes for further details and justification.   ROS Otherwise per HPI.  Assessment & Plan: Visit Diagnoses:    ICD-10-CM   1. Lumbar radiculopathy  M54.16 XR C-ARM NO REPORT    Epidural Steroid injection    methylPREDNISolone acetate (DEPO-MEDROL) injection 80 mg      Plan: No additional findings.   Meds & Orders:  Meds ordered this encounter  Medications   methylPREDNISolone acetate (DEPO-MEDROL) injection 80 mg    Orders Placed This Encounter  Procedures   XR C-ARM NO REPORT   Epidural Steroid injection    Follow-up: Return for visit to requesting provider as needed.   Procedures: No procedures performed  Lumbosacral Transforaminal Epidural Steroid Injection - Sub-Pedicular Approach with Fluoroscopic Guidance  Patient: Peter [name]      Date of Birth: August 07, 1930 MRN: [id number] PCP: Joselyn Arrow, MD      Visit Date: 12/16/2022   Universal Protocol:    Date/Time: 12/16/2022  Consent Given By: the patient  Position: PRONE  Additional Comments: Vital signs were monitored before and after the procedure. Patient was prepped and draped in the usual sterile fashion. The correct patient, procedure, and site was verified.   Injection Procedure Details:   Procedure diagnoses: Lumbar radiculopathy [M54.16]    Meds  Administered:  Meds ordered this encounter  Medications   methylPREDNISolone acetate (DEPO-MEDROL) injection 80 mg    Laterality: Bilateral  Location/Site: L4  Needle:5.0 in., 22 ga.  Short bevel or Quincke spinal needle  Needle Placement: Transforaminal  Findings:    -Comments: Excellent flow of contrast along the nerve, nerve root and into the epidural space.  Procedure Details: After squaring off the end-plates to get a true AP view, the C-arm was positioned so that an oblique view of the foramen as noted above was visualized. The target area is just inferior to the "nose of the scotty dog" or sub pedicular. The soft tissues overlying this structure were infiltrated with 2-3 ml. of 1% Lidocaine without Epinephrine.  The spinal needle was inserted toward the target using a "trajectory" view along the fluoroscope beam.  Under AP and lateral visualization, the needle was advanced so it did not puncture dura and was located close the 6 O'Clock position of the pedical in AP tracterory. Biplanar projections were used to confirm position. Aspiration was confirmed to be negative for CSF and/or blood. A 1-2 ml. volume of Isovue-250 was injected and flow of contrast was noted at each level. Radiographs were obtained for documentation purposes.   After attaining the desired flow of contrast documented above, a 0.5 to 1.0 ml test dose of 0.25% Marcaine was injected into each respective transforaminal space.  The patient was observed for 90 seconds post injection.  After no sensory deficits were reported, and normal lower extremity motor function was noted,   the above injectate was administered so  that equal amounts of the injectate were placed at each foramen (level) into the transforaminal epidural space.   Additional Comments:  No complications occurred Dressing: 2 x 2 sterile gauze and Band-Aid    Post-procedure details: Patient was observed during the procedure. Post-procedure  instructions were reviewed.  Patient left the clinic in stable condition.    Clinical History: No specialty comments available.     Objective:  VS:  HT:    WT:   BMI:     BP:(!) 165/76  HR:66bpm  TEMP: ( )  RESP:  Physical Exam Vitals and nursing note reviewed.  Constitutional:      General: He is not in acute distress.    Appearance: Normal appearance. He is not ill-appearing.  HENT:     Head: Normocephalic and atraumatic.     Right Ear: External ear normal.     Left Ear: External ear normal.     Nose: No congestion.  Eyes:     Extraocular Movements: Extraocular movements intact.  Cardiovascular:     Rate and Rhythm: Normal rate.     Pulses: Normal pulses.  Pulmonary:     Effort: Pulmonary effort is normal. No respiratory distress.  Abdominal:     General: There is no distension.     Palpations: Abdomen is soft.  Musculoskeletal:        General: No tenderness or signs of injury.     Cervical back: Neck supple.     Right lower leg: No edema.     Left lower leg: No edema.     Comments: Patient has good distal strength without clonus.  Skin:    Findings: No erythema or rash.  Neurological:     General: No focal deficit present.     Mental Status: He is alert and oriented to person, place, and time.     Sensory: No sensory deficit.     Motor: No weakness or abnormal muscle tone.     Coordination: Coordination normal.  Psychiatric:        Mood and Affect: Mood normal.        Behavior: Behavior normal.      Imaging: No results found.

## 2022-12-30 DIAGNOSIS — M25519 Pain in unspecified shoulder: Secondary | ICD-10-CM | POA: Diagnosis not present

## 2022-12-31 ENCOUNTER — Ambulatory Visit (HOSPITAL_BASED_OUTPATIENT_CLINIC_OR_DEPARTMENT_OTHER): Payer: Medicare Other | Admitting: Cardiovascular Disease

## 2023-01-06 ENCOUNTER — Encounter: Payer: Self-pay | Admitting: Cardiovascular Disease

## 2023-01-06 ENCOUNTER — Ambulatory Visit: Payer: Medicare Other | Attending: Cardiovascular Disease | Admitting: Cardiovascular Disease

## 2023-01-06 VITALS — BP 136/68 | HR 98 | Ht 68.0 in | Wt 190.6 lb

## 2023-01-06 DIAGNOSIS — I10 Essential (primary) hypertension: Secondary | ICD-10-CM

## 2023-01-06 DIAGNOSIS — E785 Hyperlipidemia, unspecified: Secondary | ICD-10-CM | POA: Diagnosis not present

## 2023-01-06 NOTE — Patient Instructions (Signed)
Medication Instructions:  Your physician recommends that you continue on your current medications as directed. Please refer to the Current Medication list given to you today.  *If you need a refill on your cardiac medications before your next appointment, please call your pharmacy*   Lab Work: NONE If you have labs (blood work) drawn today and your tests are completely normal, you will receive your results only by: MyChart Message (if you have MyChart) OR A paper copy in the mail If you have any lab test that is abnormal or we need to change your treatment, we will call you to review the results.   Testing/Procedures: NONE   Follow-Up: At [facility], you and your health needs are our priority.  As part of our continuing mission to provide you with exceptional heart care, we have created designated Provider Care Teams.  These Care Teams include your primary Cardiologist (physician) and Advanced Practice Providers (APPs -  Physician Assistants and Nurse Practitioners) who all work together to provide you with the care you need, when you need it.  We recommend signing up for the patient portal called "MyChart".  Sign up information is provided on this After Visit Summary.  MyChart is used to connect with patients for Virtual Visits (Telemedicine).  Patients are able to view lab/test results, encounter notes, upcoming appointments, etc.  Non-urgent messages can be sent to your provider as well.   To learn more about what you can do with MyChart, go to [email or web address].    Your next appointment:   1 year(s)  Provider:   Philip Nahser, MD      

## 2023-01-06 NOTE — Progress Notes (Signed)
Peter [name] Date of Birth  January 19, 1930 [facility] [address]     [address] [place], [place]  [address] [phone]  Fax  [phone]   Problems 1. Htn 2. DM 3. Hyperlipidemia 4. Elevated coronary calcium 5.  Mild carotid artery disease ( 40-60 % bilaterally)    89 yo patient of Dr. Reyes Ivan.  Hx of diabetes, HTN, dyslipidemia, and elevated coronary calcium score.  Also has mild-moderate carotid disease.  Is active but he doesn't walk regularly.  Has a cabin in [place].  No chest pain or dyspnea with his usual activities.    Retired from [employer]    Oct. 3, 2013 -  He has not had any cardiac problems - no chest pain.  He stays avtive but does not walk regularly.  He describes some symptoms c/w claudication but has great pulses in his feet.  His systolic BP has been a bit elevated.  November 07, 2012:  Peter [name] is doing well.  He has been going up to his cabin in [place] periodically.    He measures his BP regularly - some times his systolic readings are a bit high.  He wants to start walking regularly.    He does have some chest tightness when he walks.  He has had stress nuclear tests with Dr Reyes Ivan which were negative.  He also has a hiatal hernia and esophageal spasm ( according to radiologist)  .  Aug 13 2013:  Peter [name] has done well.  He still has some mild chest discomfort with exertion - especially after he has eaten.    The pain is relieved with Tums.   He has known esophageal spasm. The pain is a  Mild tightness.  He had a myoview study 8 years ago ( Dr. Reyes Ivan) which was negative.  He is not inclined to repeat the [facility] unless it is absolutely necessary.   He still gets up to his [place] house in [place].    He is able to work up there for hours without CP or dyspnea.    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  Sept. 9, 2015:  Still active and able to do all of his normal  activities. He had a lifeline screening done.  He has very mild carotid plaque.  We have known about this mild plaque. He also has some symptoms of orthostasis.   February 12, 2016:  Felling well. No CP or dyspnea. Has had a cough since April . Is trying lots of breathing meds.  Is fatigued. Has orthostasis on occasion .  Weakness  in his legs .  Aug. 22, 2018   Peter [name] is seen today .  Has had some eye surgery  No CP Still gets fatigued. ,   Walking up driveway with garbage cans causes fatigue  Overall seems to be feeling fairly well.  April 04, 2018:  Peter [name] is seen today for follow-up visit.  He seems to be doing well. More fatigue No CP ,     Sept. 14, 2020 Carney [name] is seen today for follow-up of his hypertension, hyperlipidemia, mild coronary artery calcifications and mild to moderate carotid artery disease. Gets fatigued with exercise.   Walks his dog.   Active.   Still eating salty pretzles  BP is variable   Sept. 18, 2022: Peter [name] is seen today for follow up of his HTN, coronary art.  Calcifications BP has been on the high side  Will increase the losartan  sold his cabiin in [place]  Aug. 22, 2022 Peter [name] is seen for follow up of his HTN, coronary artery calcification. Seen with his older son, Peter [name]. BP is up today  Uses lots of salt   Jan 06, 2023 Doing well  Is eating less salt  Walks the dog,  No other specific exercise   Is fixing up an old toyota highlander   Current Outpatient Medications on File Prior to Visit  Medication Sig Dispense Refill   amLODipine (NORVASC) 10 MG tablet Take 1 tablet (10 mg total) by mouth daily. 90 tablet 3   ANORO ELLIPTA 62.5-25 MCG/ACT AEPB INHALE 1 PUFF ONCE DAILY. 60 each 1   aspirin EC 81 MG tablet Take 81 mg by mouth daily.     atorvastatin (LIPITOR) 20 MG tablet Take 1 tablet (20 mg total) by mouth daily. 90 tablet 3   fluticasone (FLONASE) 50 MCG/ACT nasal spray Place 2 sprays into both nostrils daily. 48 g 3   gabapentin  (NEURONTIN) 300 MG capsule Take 1 capsule (300 mg total) by mouth at bedtime. 60 capsule 0   incobotulinumtoxinA (XEOMIN) 50 units SOLR injection Inject 50 Units into the muscle every 3 (three) months.     losartan (COZAAR) 100 MG tablet Take 1 tablet (100 mg total) by mouth daily. 90 tablet 2   Multiple Vitamin (MULTIVITAMIN) capsule Take 1 capsule by mouth daily.     albuterol (VENTOLIN HFA) 108 (90 Base) MCG/ACT inhaler INHALE 2 PUFFS EVERY 6 HOURS AS NEEDED FOR WHEEZING OR SHORTNESS OF BREATH. (Patient not taking: Reported on 12/06/2022) 18 g 1   Current Facility-Administered Medications on File Prior to Visit  Medication Dose Route Frequency Provider Last Rate Last Admin   incobotulinumtoxinA (XEOMIN) 50 units injection 50 Units  50 Units Intramuscular Q90 days Levert Feinstein, MD   50 Units at 02/17/22 1221     Allergies  Allergen Reactions   Iodine Other (See Comments)    Feels like he is "burning up"    Past Medical History:  Diagnosis Date   Adenomatous colon polyp 07/2013   Asthma h/o as a child-s/p immunotherapy   Diabetes mellitus diet controlled   Facial nerve spasm 08/13/2014   Fatigue 02/22/2022   FHx: colon cancer    Hiatal hernia    Hypercholesteremia    Hypertension age 10   Internal hemorrhoids 07/2013   noted on colonoscopy   Restlessness and agitation 08/13/2014   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 Randa Evens   VASECTOMY      Social History   Tobacco Use  Smoking Status Former   Types: Cigarettes   Quit date: 08/09/1974   Years since quitting: 48.4  Smokeless Tobacco Never    Social History   Substance and Sexual Activity  Alcohol Use Yes    Alcohol/week: 8.0 standard drinks of alcohol   Types: 8 Cans of beer per week   Comment: 8 beers/week    Family History  Problem Relation Age of Onset   Alcohol abuse Father    Cancer Sister  stomach/?pancreatic   Cancer Sister        lung and brain   Hypertension Sister    Colon cancer Brother 6   Cancer Brother        colon cancer in mid 79's   COPD Brother    Hypertension Brother    Diabetes Maternal Grandmother    Arthritis Daughter        psoriatic   Arthritis Daughter        psoriatic   Hyperlipidemia Son    Hyperlipidemia Son    Stroke Neg Hx    Heart disease Neg Hx     Reviw of Systems:  Reviewed in the HPI.  All other systems are negative.  Physical Exam: Blood pressure 136/68, pulse 98, height 5\' 8"  (1.727 m), weight 190 lb 9.6 oz (86.5 kg), SpO2 98 %.      GEN:  Well nourished, well developed in no acute distress HEENT: Normal NECK: No JVD; No carotid bruits LYMPHATICS: No lymphadenopathy CARDIAC: RRR , soft systolic murmur  RESPIRATORY:  Clear to auscultation without rales, wheezing or rhonchi  ABDOMEN: Soft, non-tender, non-distended MUSCULOSKELETAL:  No edema; No deformity  SKIN: Warm and dry NEUROLOGIC:  Alert and oriented x 3    ECG:     Jan 06, 2023  Sinus tachycardia at 102 ,  poor R wave progression .Marland Kitchen  No ST or T wave changes.    Assessment / Plan:   1. HTN-    BP is well controlled.   HR is up a bit today at 102 .   He says his HR is usually in the 80s  He will continue to monitor his HR and BP   2. DM -  3. Hyperlipidemia - .stable   4. Elevated coronary calcium -      5.  Mild carotid artery disease ( 40-60 % bilaterally)- asymptomatic .          Kristeen Miss, MD  01/06/2023 2:23 PM    [facility] [address] [place],  [address] [place], [place]  [address] Pager [phone] Phone: [phone]; Fax: [phone]

## 2023-01-13 DIAGNOSIS — M25519 Pain in unspecified shoulder: Secondary | ICD-10-CM | POA: Diagnosis not present

## 2023-01-20 DIAGNOSIS — M25519 Pain in unspecified shoulder: Secondary | ICD-10-CM | POA: Diagnosis not present

## 2023-01-25 NOTE — Telephone Encounter (Signed)
Called [employer] to check and see if codes required auth. I spoke with rep Dareen Piano. He stated that codes [address] and [phone] do not require prior auth and are covered under buy/bill. Ref # for call is [id number].

## 2023-01-27 DIAGNOSIS — M25519 Pain in unspecified shoulder: Secondary | ICD-10-CM | POA: Diagnosis not present

## 2023-02-01 ENCOUNTER — Encounter: Payer: Self-pay | Admitting: Sports Medicine

## 2023-02-03 DIAGNOSIS — M25519 Pain in unspecified shoulder: Secondary | ICD-10-CM | POA: Diagnosis not present

## 2023-02-04 ENCOUNTER — Encounter: Payer: Self-pay | Admitting: Sports Medicine

## 2023-02-07 ENCOUNTER — Other Ambulatory Visit: Payer: Self-pay | Admitting: Family Medicine

## 2023-02-07 DIAGNOSIS — J441 Chronic obstructive pulmonary disease with (acute) exacerbation: Secondary | ICD-10-CM

## 2023-02-07 NOTE — Telephone Encounter (Signed)
Is this okay to refill? 

## 2023-02-09 ENCOUNTER — Telehealth: Payer: Self-pay

## 2023-02-09 ENCOUNTER — Telehealth (HOSPITAL_BASED_OUTPATIENT_CLINIC_OR_DEPARTMENT_OTHER): Payer: Self-pay | Admitting: Cardiovascular Disease

## 2023-02-09 NOTE — Telephone Encounter (Signed)
Left message for patient to call and discuss scheduling the follow up caraotid doppler ordered by Dr. Duke Salvia

## 2023-02-09 NOTE — Telephone Encounter (Signed)
Pre authorization for Qutenza submitted through Qutenza portal. Awaiting approval

## 2023-02-17 DIAGNOSIS — M25519 Pain in unspecified shoulder: Secondary | ICD-10-CM | POA: Diagnosis not present

## 2023-02-21 ENCOUNTER — Telehealth: Payer: Self-pay | Admitting: Physical Medicine and Rehabilitation

## 2023-02-21 NOTE — Telephone Encounter (Signed)
Patient's son called. Would like a call back [phone]

## 2023-02-23 ENCOUNTER — Encounter: Payer: Self-pay | Admitting: Neurology

## 2023-02-23 ENCOUNTER — Ambulatory Visit: Payer: Medicare Other | Admitting: Neurology

## 2023-02-23 VITALS — BP 106/60 | HR 77 | Ht 69.0 in | Wt 187.0 lb

## 2023-02-23 DIAGNOSIS — G5131 Clonic hemifacial spasm, right: Secondary | ICD-10-CM | POA: Diagnosis not present

## 2023-02-23 MED ORDER — INCOBOTULINUMTOXINA 50 UNITS IM SOLR
50.0000 [IU] | Freq: Once | INTRAMUSCULAR | Status: AC
Start: 2023-02-23 — End: 2023-02-23
  Administered 2023-02-23: 50 [IU] via INTRAMUSCULAR

## 2023-02-23 NOTE — Progress Notes (Signed)
    PATIENT: Peter [name] DOB: 1930-04-14  Chief Complaint  Patient presents with   xeomin    Rm14, xeomin,      HISTORICAL  Peter [name] is 89 years old right-handed male, accompanied by his wife, seen in refer by Dr. Vickey Huger for evaluation of EMG guided botulism toxin injection for right hemifacial spasm  I reviewed and summarized his medical history, he had a history of hypertension hyperlipidemia, bilateral cataract surgery, around 2015, he noticed right facial muscle spasm, initially mainly involving muscle around his right eye, over the past few years, gradually getting worse, more frequent, occasionally blocking his right vision, also spreading to right cheek, right upper mouth corner, is present 25% daytime, he also has bilateral senior ptosis, occasionally blocking upper vision, was suggested bilateral blepharoplasty by his ophthalmologist.   He denies a history of right facial palsy, no visual loss, no hearing loss,  He complains of chronic cough for few months, He also complains of chronic low back pain, knee pain, mild gait abnormality.  Update April 01 2016:  We have personally reviewed MRI of brain in July 2017, there is evidence of generalized atrophy, supratentorium small vessel disease, no acute abnormalities.  We reviewed that he has vascular risk factor of aging, hypertension, hyperlipidemia, he should take baby aspirin daily.  Today is his first EMG guided Xeomin injection for right hemifacial spasm, all the questions were answered, potential side effect was explained  UPDATE Jul 07 2016: He responded very well to previous EMG guided Xeomin injection in August 2017 for right hemifacial spasm, the same time he was evaluated by ophthalmologist, is planning on to have right blepharoplasty for redundant tissue at the right upper eyelid.  UPDATE March 09 2017: He had bilateral blepharoplasty, also tear duct procedure, only recent few weeks, he noticed  recurrent right eye, and cheek muscle twitching, last Xeomin injection was on July 07 7016.  UPDATE Jun 15 2017: He did very well with previous injection, there is no significant side effect noticed.  UPDATE Jun 19 2018: Last injection was in 2018, he did very well.  UPDATE Oct 03 2019: He responded very well to previous injection  UPDATE March 03 2021: He return for EMG guided Xeomin injection for right hemifacial spasm, previous injection works well for him  UPDATE Jun 03 2021: He did well with previous injection, return for EMG guided injection,  UPDATE November 10 2021: responded well to previous injection  Update February 11, 2022: He responded well to previous injection  UPDATE February 23 2023: He did well with previous injection, only recently began to have recovery  Activity especially while smiling   PHYSICAL EXAMNIATION:   CN VII: He has occasional right hemifacial muscle spasm, mainly involving right orbicularis oculi, occasional involvement of right cheek, upper mouth area and right frontalis muscle   DIAGNOSTIC DATA (LABS, IMAGING, TESTING) - I reviewed patient records, labs, notes, testing and imaging myself where available.   ASSESSMENT AND PLAN  Peter [name] is a 89 y.o. male   Right hemifacial spasm  EMG guided xeomin injection, 50 units/1 cc normal saline 2.5 units each x16=40 units, discard 10 units         Peter [name], M.D. Ph.D.  [facility] [address] [place], [place] [address] Ph: [phone] Fax: [phone]  CC: Peter Arrow, MD

## 2023-02-23 NOTE — Progress Notes (Signed)
xenomin 50units x 1 vial  [id number] Exp-07.2026 B/B Bacteriostatic 0.9% Sodium Chloride- 1mL  [id number]:[id number] Expiration: 07.30.2024 NDC: [id number] Dx: G51.31 WITNESSED [id number], CMA

## 2023-02-23 NOTE — Telephone Encounter (Signed)
Spoke with son. He is scheduled for July 31st with Dr. Shon Baton

## 2023-03-03 DIAGNOSIS — M25519 Pain in unspecified shoulder: Secondary | ICD-10-CM | POA: Diagnosis not present

## 2023-03-09 ENCOUNTER — Ambulatory Visit: Payer: Medicare Other | Admitting: Sports Medicine

## 2023-03-09 ENCOUNTER — Encounter: Payer: Self-pay | Admitting: Sports Medicine

## 2023-03-09 VITALS — BP 135/62 | HR 77

## 2023-03-09 DIAGNOSIS — G5793 Unspecified mononeuropathy of bilateral lower limbs: Secondary | ICD-10-CM | POA: Diagnosis not present

## 2023-03-09 DIAGNOSIS — E1142 Type 2 diabetes mellitus with diabetic polyneuropathy: Secondary | ICD-10-CM | POA: Diagnosis not present

## 2023-03-09 NOTE — Progress Notes (Signed)
Peter [name] - 89 y.o. male MRN [id number]  Date of birth: Sep 29, 1929  Office Visit Note: Visit Date: 03/09/2023 PCP: Joselyn Arrow, MD Referred by: Joselyn Arrow, MD  Subjective: Chief Complaint  Patient presents with   Right Foot - Follow-up   Left Foot - Follow-up   HPI: Peter [name] is a pleasant 89 y.o. male who presents today for bilateral foot pain with LE diabetic neuropathy.  He is a type II diabetic - he is currently diet-controlled.  He has had chronic diabetic neuropathy for years, slightly worse recently over months. He is managed on gabapentin 300 mg nightly with only some relief.  Wears supportive insoles help support the feet, but still has persisting symptoms.   He would like to proceed with Qutenza treatment today.  Lab Results  Component Value Date   HGBA1C 6.8 (A) 12/06/2022   Pertinent ROS were reviewed with the patient and found to be negative unless otherwise specified above in HPI.   Assessment & Plan: Visit Diagnoses:  1. Type 2 diabetes mellitus with diabetic polyneuropathy, without long-term current use of insulin (HCC)   2. Neuropathy involving both lower extremities    Plan: We discussed pharmacologic and conservative treatment measures for [name] lower extremity diabetic neuropathy, did proceed with Qutenza treatment on dorsal and plantar aspects of both feet, as below. Discussed Qutenza application procedure today is used to treat the distal peripheral nerve(s) of each foot with chemical denervation, and is an option for neuropathic pain control. Discussed that this is a capsaicin patch, stronger than capsaicin cream. Discussed that it is currently approved for diabetic peripheral neuropathy and post-herpetic neuralgia, but that it has also shown benefit in treating other forms of neuropathy. Discussed that the patch would be placed in office and benefits usually last 3 months. Discussed that unintended exposure to capsaicin can cause severe  irritation of eyes, mucous membranes, respiratory tract, and skin, but that Qutenza is a local treatment and does not have the systemic side effects of other nerve medications. Discussed that there may be pain, itching, erythema, and decreased sensory function associated with the application of Qutenza. Side effects usually subside within 1 week. A cold pack of analgesic medications can help with these side effects if needed, but was not required during treatment today. He will let me know how much improvement he receives, and we can repeat this q 3 months as she desires. May continue in his shoe inserts and may use Gabapentin qHS as needed. The representative, Nadine Counts, was present during the visit today to answer questions about Qutenza as well.    Follow-up: Return in about 3 months (around 06/09/2023), or if symptoms worsen or fail to improve.   Meds & Orders: No orders of the defined types were placed in this encounter.  No orders of the defined types were placed in this encounter.   Procedures: - 1 patch of Qutenza was applied to the area of foot pain, which included the dorsal and plantar aspect of each foot for a total of 4 Qutenza patches used to treat the distal peripheral nerve(s) of each foot with chemical denervation using Capsaicin 8% (Qutenza). Blood pressure was monitored every 15 minutes without abnormality. The patient tolerated the procedure well. Post-procedure instructions were given. Qutenza representative was present during visit to answer questions.     Clinical History: No specialty comments available.  He reports that he quit smoking about 48 years ago. His smoking use included cigarettes. He has never used  smokeless tobacco.  Recent Labs    04/13/22 1114 12/06/22 1020  HGBA1C 7.0* 6.8*    Objective:   Vital Signs: BP 135/62   Pulse 77   Physical Exam  Gen: Well-appearing, in no acute distress; non-toxic CV: Regular Rate. Well-perfused. Warm.  Resp: Breathing  unlabored on room air; no wheezing. Psych: Fluid speech in conversation; appropriate affect; normal thought process Neuro: Sensation intact throughout. No gross coordination deficits.   Ortho Exam - Bilateral feet: Patient does have mild to moderate pes planus.  He has sensation to light touch on dorsal and plantar aspect of the foot.  Hammertoe deformity of toes 2-3 noted.  Cap refill less than 2 seconds.   Imaging: No results found.  Past Medical/Family/Surgical/Social History: Medications & Allergies reviewed per EMR, new medications updated. Patient Active Problem List   Diagnosis Date Noted   Stage 3 chronic kidney disease (HCC) 04/16/2022   Fatigue 02/22/2022   Bilateral carotid artery disease (HCC) 02/22/2022   Acute pain of right foot 09/17/2021   Traumatic partial tear of right biceps tendon 06/30/2021   Rupture of pectoralis major muscle 06/30/2021   Greater trochanteric pain syndrome 01/24/2018   Hemiparesis affecting right side as late effect of cerebrovascular accident (HCC) 06/15/2017   Mixed hyperlipidemia 03/30/2017   Rotator cuff tear arthropathy of right shoulder 04/14/2016   Hemifacial spasm 02/12/2016   Abnormality of gait 02/12/2016   Increased prostate specific antigen (PSA) velocity 01/07/2016   Type 2 diabetes mellitus without complication (HCC) 12/18/2014   Full thickness rotator cuff tear, Left Shoulder 12/03/2014   Injury of left shoulder 08/22/2014   Facial nerve spasm 08/13/2014   Restlessness and agitation 08/13/2014   Allergic rhinitis 11/22/2012   Chest tightness 11/07/2012   Controlled type 2 diabetes mellitus with complication, without long-term current use of insulin (HCC) 01/21/2011   Pure hypercholesterolemia 01/21/2011   Essential hypertension 01/21/2011   Erectile dysfunction 01/21/2011   Past Medical History:  Diagnosis Date   Adenomatous colon polyp 07/2013   Asthma h/o as a child-s/p immunotherapy   Diabetes mellitus diet controlled    Facial nerve spasm 08/13/2014   Fatigue 02/22/2022   FHx: colon cancer    Hiatal hernia    Hypercholesteremia    Hypertension age 73   Internal hemorrhoids 07/2013   noted on colonoscopy   Restlessness and agitation 08/13/2014   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 09/18/2019   Dr.Whitfield   Family History  Problem Relation Age of Onset   Alcohol abuse Father    Cancer Sister        stomach/?pancreatic   Cancer Sister        lung and brain   Hypertension Sister    Colon cancer Brother 60   Cancer Brother        colon cancer in mid 55's   COPD Brother    Hypertension Brother    Diabetes Maternal Grandmother    Arthritis Daughter        psoriatic   Arthritis Daughter        psoriatic   Hyperlipidemia Son    Hyperlipidemia Son    Stroke Neg Hx    Heart disease Neg Hx    Past Surgical History:  Procedure Laterality Date   APPENDECTOMY     BLEPHAROPLASTY Bilateral 12/13/2016   CARDIOVASCULAR STRESS TEST  normal   CATARACT EXTRACTION, BILATERAL  07/2014, 08/2014   COLONOSCOPY  3/08, 07/2013   no repeat rec  due to age per Dr. 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)  Tobacco Use   Smoking status: Former    Current packs/day: 0.00    Types: Cigarettes    Quit date: 08/09/1974    Years since quitting: 48.6   Smokeless tobacco: Never  Vaping Use   Vaping status: Never Used  Substance and Sexual Activity   Alcohol use: Yes    Alcohol/week: 8.0 standard drinks of alcohol    Types: 8 Cans of beer per week    Comment: 8 beers/week   Drug use: No   Sexual activity: Yes    Partners: Female   I spent 39 minutes in the care of the patient today including face-to-face time, preparation to see the patient, as well as discussion on R/B/I for Qutenza, times spent for application, pre and post-care instructions, re-evaluation during active treatment, discussion and instruction with Qutenza  representative in the room, and discussion on other conservative treatment options for the above diagnoses.  Madelyn Brunner, DO Primary Care Sports Medicine Physician  [facility]  This note was dictated using Dragon naturally speaking software and may contain errors in syntax, spelling, or content which have not been identified prior to signing this note.

## 2023-03-10 DIAGNOSIS — M25519 Pain in unspecified shoulder: Secondary | ICD-10-CM | POA: Diagnosis not present

## 2023-03-10 NOTE — Addendum Note (Signed)
Addended by: Jamie Kato III on: 03/10/2023 08:40 PM   Modules accepted: Level of Service

## 2023-03-17 ENCOUNTER — Ambulatory Visit (INDEPENDENT_AMBULATORY_CARE_PROVIDER_SITE_OTHER): Payer: Medicare Other

## 2023-03-17 DIAGNOSIS — I6523 Occlusion and stenosis of bilateral carotid arteries: Secondary | ICD-10-CM | POA: Diagnosis not present

## 2023-03-24 DIAGNOSIS — M25519 Pain in unspecified shoulder: Secondary | ICD-10-CM | POA: Diagnosis not present

## 2023-03-29 ENCOUNTER — Ambulatory Visit: Payer: Medicare Other | Admitting: Sports Medicine

## 2023-03-29 ENCOUNTER — Encounter: Payer: Self-pay | Admitting: Sports Medicine

## 2023-03-29 DIAGNOSIS — M170 Bilateral primary osteoarthritis of knee: Secondary | ICD-10-CM | POA: Diagnosis not present

## 2023-03-29 DIAGNOSIS — G8929 Other chronic pain: Secondary | ICD-10-CM | POA: Diagnosis not present

## 2023-03-29 DIAGNOSIS — E1142 Type 2 diabetes mellitus with diabetic polyneuropathy: Secondary | ICD-10-CM | POA: Diagnosis not present

## 2023-03-29 DIAGNOSIS — M25561 Pain in right knee: Secondary | ICD-10-CM | POA: Diagnosis not present

## 2023-03-29 DIAGNOSIS — M25562 Pain in left knee: Secondary | ICD-10-CM

## 2023-03-29 DIAGNOSIS — G5793 Unspecified mononeuropathy of bilateral lower limbs: Secondary | ICD-10-CM | POA: Diagnosis not present

## 2023-03-29 MED ORDER — BUPIVACAINE HCL 0.25 % IJ SOLN
2.0000 mL | INTRAMUSCULAR | Status: AC | PRN
Start: 2023-03-29 — End: 2023-03-29
  Administered 2023-03-29: 2 mL via INTRA_ARTICULAR

## 2023-03-29 MED ORDER — LIDOCAINE HCL 1 % IJ SOLN
2.0000 mL | INTRAMUSCULAR | Status: AC | PRN
Start: 2023-03-29 — End: 2023-03-29
  Administered 2023-03-29: 2 mL

## 2023-03-29 MED ORDER — METHYLPREDNISOLONE ACETATE 40 MG/ML IJ SUSP
40.0000 mg | INTRAMUSCULAR | Status: AC | PRN
Start: 2023-03-29 — End: 2023-03-29
  Administered 2023-03-29: 40 mg via INTRA_ARTICULAR

## 2023-03-29 MED ORDER — METHYLPREDNISOLONE ACETATE 40 MG/ML IJ SUSP
80.0000 mg | INTRAMUSCULAR | Status: AC | PRN
Start: 2023-03-29 — End: 2023-03-29
  Administered 2023-03-29: 80 mg via INTRA_ARTICULAR

## 2023-03-29 NOTE — Progress Notes (Signed)
Peter [name] - 89 y.o. male MRN [id number]  Date of birth: [birth year]  Office Visit Note: Visit Date: 03/29/2023 PCP: Joselyn Arrow, MD Referred by: Joselyn Arrow, MD  Subjective: Chief Complaint  Patient presents with   Left Knee - Follow-up   Right Knee - Follow-up   HPI: Peter [name] is a pleasant 89 y.o. male who presents today for bilateral knee pain with known osteoarthritis, f/u of lower extremity diabetic neuropathy.  LE diabetic neuropathy -did proceed with Qutenza application treatment for both feet on 03/09/2023, did have some burning for the first few days relief with ice.  Has noticed some change but no gross improvement in his neuropathy.  Does use gabapentin 300 mg nightly as needed.  Bilateral knees - has known arthritis but certainly not bone-on-bone change.  Has been having some pain here recently, previous had a right knee injection back in April which worked well, interested in repeat injections today. Does not take medications for this on consistent basis.  Pertinent ROS were reviewed with the patient and found to be negative unless otherwise specified above in HPI.   Assessment & Plan: Visit Diagnoses:  1. Bilateral primary osteoarthritis of knee   2. Neuropathy involving both lower extremities   3. Bilateral chronic knee pain   4. Type 2 diabetes mellitus with diabetic polyneuropathy, without long-term current use of insulin (HCC)    Plan: Peter [name] is having an exacerbation of his chronic underlying bilateral knee pain with osteoarthritis.  He has received benefit from corticosteroid injections in the past, we did repeat this for the right and left knee today, patient tolerated well.  May use ice or over-the-counter Tylenol as needed for any postinjection pain.  He will continue remaining active and walking.  He did get some relief from Qutenza application for his lower extremity neuropathy, he is interested in repeating a second treatment deceiving get  further benefit going forward, we will plan for this at least 3 months from his last appointment.  He may continue gabapentin 300 mg nightly as needed.  Will see him back for Qutenza in November, I am happy to see him back sooner if any issues arise.  Follow-up: Return for Return in about 3 months (around 06/09/2023) for Qutenza treatment (30-min appt).   Meds & Orders: No orders of the defined types were placed in this encounter.   Orders Placed This Encounter  Procedures   Large Joint Inj   Large Joint Inj     Procedures: Large Joint Inj: R knee on 03/29/2023 2:34 PM Indications: pain Details: 22 G 1.5 in needle, anterolateral approach Medications: 2 mL lidocaine 1 %; 2 mL bupivacaine 0.25 %; 40 mg methylPREDNISolone acetate 40 MG/ML Outcome: tolerated well, no immediate complications Procedure, treatment alternatives, risks and benefits explained, specific risks discussed. Consent was given by the patient. Immediately prior to procedure a time out was called to verify the correct patient, procedure, equipment, support staff and site/side marked as required. Patient was prepped and draped in the usual sterile fashion.    Large Joint Inj: L knee on 03/29/2023 2:35 PM Details: 22 G 1.5 in needle, anterolateral approach Medications: 2 mL lidocaine 1 %; 2 mL bupivacaine 0.25 %; 80 mg methylPREDNISolone acetate 40 MG/ML Outcome: tolerated well, no immediate complications Procedure, treatment alternatives, risks and benefits explained, specific risks discussed. Consent was given by the patient. Immediately prior to procedure a time out was called to verify the correct patient, procedure, equipment, support staff and site/side  marked as required. Patient was prepped and draped in the usual sterile fashion.          Clinical History: No specialty comments available.  He reports that he quit smoking about 48 years ago. His smoking use included cigarettes. He has never used smokeless tobacco.   Recent Labs    04/13/22 1114 12/06/22 1020  HGBA1C 7.0* 6.8*    Objective:   Vital Signs: There were no vitals taken for this visit.  Physical Exam  Gen: Well-appearing, in no acute distress; non-toxic CV: Well-perfused. Warm.  Resp: Breathing unlabored on room air; no wheezing. Psych: Fluid speech in conversation; appropriate affect; normal thought process Neuro: Sensation intact throughout. No gross coordination deficits.   Ortho Exam - Bilateral knees: Right knee with trace effusion without warmth or redness.  Range of motion preserved in both knees from 0-130 degrees.  There are some mild TTP on the lateral joint line right greater than left.   Imaging:  Knee XR 10/15/22: 4 views of the right knee including AP, Rosenberg, lateral and sunrise  views were ordered and reviewed by myself.  X-rays demonstrate mild to  moderate tricompartmental osteoarthritic change.  Zoila Shutter view does show  collapse of the lateral joint line with near bone-on-bone arthritic change  of the lateral side.  There is some tibial spurring off the lateral  aspect.  No acute bony fracture noted.   Past Medical/Family/Surgical/Social History: Medications & Allergies reviewed per EMR, new medications updated. Patient Active Problem List   Diagnosis Date Noted   Stage 3 chronic kidney disease (HCC) 04/16/2022   Fatigue 02/22/2022   Bilateral carotid artery disease (HCC) 02/22/2022   Acute pain of right foot 09/17/2021   Traumatic partial tear of right biceps tendon 06/30/2021   Rupture of pectoralis major muscle 06/30/2021   Greater trochanteric pain syndrome 01/24/2018   Hemiparesis affecting right side as late effect of cerebrovascular accident (HCC) 06/15/2017   Mixed hyperlipidemia 03/30/2017   Rotator cuff tear arthropathy of right shoulder 04/14/2016   Hemifacial spasm 02/12/2016   Abnormality of gait 02/12/2016   Increased prostate specific antigen (PSA) velocity 01/07/2016   Type 2  diabetes mellitus without complication (HCC) 12/18/2014   Full thickness rotator cuff tear, Left Shoulder 12/03/2014   Injury of left shoulder 08/22/2014   Facial nerve spasm 08/13/2014   Restlessness and agitation 08/13/2014   Allergic rhinitis 11/22/2012   Chest tightness 11/07/2012   Controlled type 2 diabetes mellitus with complication, without long-term current use of insulin (HCC) 01/21/2011   Pure hypercholesterolemia 01/21/2011   Essential hypertension 01/21/2011   Erectile dysfunction 01/21/2011   Past Medical History:  Diagnosis Date   Adenomatous colon polyp 07/2013   Asthma h/o as a child-s/p immunotherapy   Diabetes mellitus diet controlled   Facial nerve spasm 08/13/2014   Fatigue 02/22/2022   FHx: colon cancer    Hiatal hernia    Hypercholesteremia    Hypertension age 3   Internal hemorrhoids 07/2013   noted on colonoscopy   Restlessness and agitation 08/13/2014   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 09/18/2019   Dr.Whitfield   Family History  Problem Relation Age of Onset   Alcohol abuse Father    Cancer Sister        stomach/?pancreatic   Cancer Sister        lung and brain   Hypertension Sister    Colon cancer Brother 20   Cancer Brother  colon cancer in mid 60's   COPD Brother    Hypertension Brother    Diabetes Maternal Grandmother    Arthritis Daughter        psoriatic   Arthritis Daughter        psoriatic   Hyperlipidemia Son    Hyperlipidemia Son    Stroke Neg Hx    Heart disease Neg Hx    Past Surgical History:  Procedure Laterality Date   APPENDECTOMY     BLEPHAROPLASTY Bilateral 12/13/2016   CARDIOVASCULAR STRESS TEST  normal   CATARACT EXTRACTION, BILATERAL  07/2014, 08/2014   COLONOSCOPY  3/08, 07/2013   no repeat rec due to age per Dr. 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

## 2023-03-31 DIAGNOSIS — M25519 Pain in unspecified shoulder: Secondary | ICD-10-CM | POA: Diagnosis not present

## 2023-04-05 ENCOUNTER — Other Ambulatory Visit: Payer: Self-pay | Admitting: Sports Medicine

## 2023-04-07 DIAGNOSIS — M25519 Pain in unspecified shoulder: Secondary | ICD-10-CM | POA: Diagnosis not present

## 2023-04-14 ENCOUNTER — Other Ambulatory Visit: Payer: Self-pay | Admitting: Sports Medicine

## 2023-04-14 ENCOUNTER — Telehealth: Payer: Self-pay

## 2023-04-14 DIAGNOSIS — K080 Exfoliation of teeth due to systemic causes: Secondary | ICD-10-CM | POA: Diagnosis not present

## 2023-04-14 MED ORDER — GABAPENTIN 300 MG PO CAPS: 300.0000 mg | ORAL_CAPSULE | Freq: Every day | ORAL | 1 refills | Status: DC

## 2023-04-14 NOTE — Telephone Encounter (Signed)
Toniann Fail with [facility] [place] pharmacy called concerning Rx for Gabapentin.  Cb# [id number].  Please advise.  Thank you.

## 2023-04-15 ENCOUNTER — Other Ambulatory Visit (HOSPITAL_BASED_OUTPATIENT_CLINIC_OR_DEPARTMENT_OTHER): Payer: Self-pay | Admitting: Cardiovascular Disease

## 2023-04-15 ENCOUNTER — Other Ambulatory Visit: Payer: Self-pay | Admitting: Cardiovascular Disease

## 2023-04-18 ENCOUNTER — Other Ambulatory Visit (HOSPITAL_BASED_OUTPATIENT_CLINIC_OR_DEPARTMENT_OTHER): Payer: Self-pay | Admitting: Cardiovascular Disease

## 2023-04-21 DIAGNOSIS — M25519 Pain in unspecified shoulder: Secondary | ICD-10-CM | POA: Diagnosis not present

## 2023-04-28 DIAGNOSIS — M25519 Pain in unspecified shoulder: Secondary | ICD-10-CM | POA: Diagnosis not present

## 2023-05-12 DIAGNOSIS — M25519 Pain in unspecified shoulder: Secondary | ICD-10-CM | POA: Diagnosis not present

## 2023-05-18 ENCOUNTER — Other Ambulatory Visit: Payer: Medicare Other

## 2023-06-01 ENCOUNTER — Ambulatory Visit: Payer: Medicare Other | Admitting: Neurology

## 2023-06-10 ENCOUNTER — Encounter: Payer: Self-pay | Admitting: Sports Medicine

## 2023-06-10 ENCOUNTER — Ambulatory Visit: Payer: Medicare Other | Admitting: Sports Medicine

## 2023-06-10 DIAGNOSIS — M25561 Pain in right knee: Secondary | ICD-10-CM | POA: Diagnosis not present

## 2023-06-10 DIAGNOSIS — G8929 Other chronic pain: Secondary | ICD-10-CM

## 2023-06-10 DIAGNOSIS — M1711 Unilateral primary osteoarthritis, right knee: Secondary | ICD-10-CM

## 2023-06-10 DIAGNOSIS — M25562 Pain in left knee: Secondary | ICD-10-CM

## 2023-06-10 DIAGNOSIS — M170 Bilateral primary osteoarthritis of knee: Secondary | ICD-10-CM

## 2023-06-10 DIAGNOSIS — G5793 Unspecified mononeuropathy of bilateral lower limbs: Secondary | ICD-10-CM | POA: Diagnosis not present

## 2023-06-10 MED ORDER — BUPIVACAINE HCL 0.25 % IJ SOLN
2.0000 mL | INTRAMUSCULAR | Status: AC | PRN
Start: 2023-06-10 — End: 2023-06-10
  Administered 2023-06-10: 2 mL via INTRA_ARTICULAR

## 2023-06-10 MED ORDER — LIDOCAINE HCL 1 % IJ SOLN
2.0000 mL | INTRAMUSCULAR | Status: AC | PRN
Start: 2023-06-10 — End: 2023-06-10
  Administered 2023-06-10: 2 mL

## 2023-06-10 MED ORDER — METHYLPREDNISOLONE ACETATE 40 MG/ML IJ SUSP
40.0000 mg | INTRAMUSCULAR | Status: AC | PRN
Start: 2023-06-10 — End: 2023-06-10
  Administered 2023-06-10: 40 mg via INTRA_ARTICULAR

## 2023-06-10 NOTE — Progress Notes (Signed)
Patient says that he had good relief from the previous knee injections. He says that his left one is still pain free, but that the right one has been bothersome for the last 1-2 weeks.

## 2023-06-10 NOTE — Progress Notes (Signed)
Peter [name] - 89 y.o. male MRN [id number]  Date of birth: Feb 12, 1930  Office Visit Note: Visit Date: 06/10/2023 PCP: Joselyn Arrow, MD Referred by: Joselyn Arrow, MD  Subjective: Chief Complaint  Patient presents with   Right Knee - Pain    Injection   HPI: Peter [name] is a pleasant 89 y.o. male who presents today for bilateral knee osteoarthritis with right greater than left knee pain.  Bilateral knees -about 3 months ago we did proceed with corticosteroid injection.  This helped his pain rather significantly.  The left knee is doing doing great but his right knee is starting to become exacerbated.  He also has lower extremity diabetic neuropathy -we are planning on repeating Qutenza treatment for the upcoming week.  He still continues with gabapentin 300 mg nightly.  Pertinent ROS were reviewed with the patient and found to be negative unless otherwise specified above in HPI.   Assessment & Plan: Visit Diagnoses:  1. Bilateral primary osteoarthritis of knee   2. Bilateral chronic knee pain   3. Neuropathy involving both lower extremities    Plan: Impression is bilateral knee arthritis with right knee exacerbation. Through shared decision-making, did proceed with corticosteroid injection, patient tolerated well.  For his lower extremity diabetic neuropathy, he will continue his gabapentin 300 mg nightly.  We are planning to repeat Qutenza (capsaicin) treatment, this will be his second application this upcoming week.  He will follow-up in 1 week for this and evaluation.  Discussed the role for compression stockings for his pedal edema if desires.  Follow-up: Return in about 1 week (around 06/17/2023) for for Qutenza / LE neuropathy.   Meds & Orders: No orders of the defined types were placed in this encounter.   Orders Placed This Encounter  Procedures   Large Joint Inj: R knee     Procedures: Large Joint Inj: R knee on 06/10/2023 3:51 PM Indications:  pain Details: 22 G 1.5 in needle, anterolateral approach Medications: 2 mL lidocaine 1 %; 2 mL bupivacaine 0.25 %; 40 mg methylPREDNISolone acetate 40 MG/ML Outcome: tolerated well, no immediate complications  Knee Injection, Right: After discussion on risks/benefits/indications, informed verbal consent was obtained and a timeout was performed, patient was seated on exam table. The patient's knee was prepped with Betadine and alcohol swab and utilizing anterolateral approach, the patient's knee was injected intraarticularly with 2:2:1 lidocaine 1%:bupivicaine 0.25%:depomedrol. Patient tolerated the procedure well without immediate complications.  Procedure, treatment alternatives, risks and benefits explained, specific risks discussed. Consent was given by the patient. Immediately prior to procedure a time out was called to verify the correct patient, procedure, equipment, support staff and site/side marked as required. Patient was prepped and draped in the usual sterile fashion.          Clinical History: No specialty comments available.  He reports that he quit smoking about 48 years ago. His smoking use included cigarettes. He has never used smokeless tobacco.  Recent Labs    12/06/22 1020  HGBA1C 6.8*    Objective:    Physical Exam  Gen: Well-appearing, in no acute distress; non-toxic CV: Well-perfused. Warm.  Resp: Breathing unlabored on room air; no wheezing. Psych: Fluid speech in conversation; appropriate affect; normal thought process Neuro: Sensation intact throughout. No gross coordination deficits.   Ortho Exam - Bilateral knees: Right knee with trace effusion without warmth or redness.  Range of motion preserved in both knees from 0-130 degrees.  There are some mild TTP on the  medial > lateral joint line right greater than left.   Imaging:  4 views of the right knee including AP, Rosenberg, lateral and sunrise  views were ordered and reviewed by myself.  X-rays  demonstrate mild to  moderate tricompartmental osteoarthritic change.  Zoila Shutter view does show  collapse of the lateral joint line with near bone-on-bone arthritic change  of the lateral side.  There is some tibial spurring off the lateral  aspect.  No acute bony fracture noted.   Past Medical/Family/Surgical/Social History: Medications & Allergies reviewed per EMR, new medications updated. Patient Active Problem List   Diagnosis Date Noted   Stage 3 chronic kidney disease (HCC) 04/16/2022   Fatigue 02/22/2022   Bilateral carotid artery disease (HCC) 02/22/2022   Acute pain of right foot 09/17/2021   Traumatic partial tear of right biceps tendon 06/30/2021   Rupture of pectoralis major muscle 06/30/2021   Greater trochanteric pain syndrome 01/24/2018   Hemiparesis affecting right side as late effect of cerebrovascular accident (HCC) 06/15/2017   Mixed hyperlipidemia 03/30/2017   Rotator cuff tear arthropathy of right shoulder 04/14/2016   Hemifacial spasm 02/12/2016   Abnormality of gait 02/12/2016   Increased prostate specific antigen (PSA) velocity 01/07/2016   Type 2 diabetes mellitus without complication (HCC) 12/18/2014   Full thickness rotator cuff tear, Left Shoulder 12/03/2014   Injury of left shoulder 08/22/2014   Facial nerve spasm 08/13/2014   Restlessness and agitation 08/13/2014   Allergic rhinitis 11/22/2012   Chest tightness 11/07/2012   Controlled type 2 diabetes mellitus with complication, without long-term current use of insulin (HCC) 01/21/2011   Pure hypercholesterolemia 01/21/2011   Essential hypertension 01/21/2011   Erectile dysfunction 01/21/2011   Past Medical History:  Diagnosis Date   Adenomatous colon polyp 07/2013   Asthma h/o as a child-s/p immunotherapy   Diabetes mellitus diet controlled   Facial nerve spasm 08/13/2014   Fatigue 02/22/2022   FHx: colon cancer    Hiatal hernia    Hypercholesteremia    Hypertension age 64   Internal  hemorrhoids 07/2013   noted on colonoscopy   Restlessness and agitation 08/13/2014   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 09/18/2019   Dr.Whitfield   Family History  Problem Relation Age of Onset   Alcohol abuse Father    Cancer Sister        stomach/?pancreatic   Cancer Sister        lung and brain   Hypertension Sister    Colon cancer Brother 58   Cancer Brother        colon cancer in mid 55's   COPD Brother    Hypertension Brother    Diabetes Maternal Grandmother    Arthritis Daughter        psoriatic   Arthritis Daughter        psoriatic   Hyperlipidemia Son    Hyperlipidemia Son    Stroke Neg Hx    Heart disease Neg Hx    Past Surgical History:  Procedure Laterality Date   APPENDECTOMY     BLEPHAROPLASTY Bilateral 12/13/2016   CARDIOVASCULAR STRESS TEST  normal   CATARACT EXTRACTION, BILATERAL  07/2014, 08/2014   COLONOSCOPY  3/08, 07/2013   no repeat rec due to age per Dr. 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

## 2023-06-15 ENCOUNTER — Ambulatory Visit: Payer: Medicare Other | Admitting: Sports Medicine

## 2023-06-15 ENCOUNTER — Encounter: Payer: Self-pay | Admitting: Sports Medicine

## 2023-06-15 VITALS — BP 130/62 | HR 88

## 2023-06-15 DIAGNOSIS — G5793 Unspecified mononeuropathy of bilateral lower limbs: Secondary | ICD-10-CM | POA: Diagnosis not present

## 2023-06-15 DIAGNOSIS — E1142 Type 2 diabetes mellitus with diabetic polyneuropathy: Secondary | ICD-10-CM | POA: Diagnosis not present

## 2023-06-15 NOTE — Progress Notes (Signed)
Peter [name] - 89 y.o. male MRN [id number]  Date of birth: 08-Feb-1930  Office Visit Note: Visit Date: 06/15/2023 PCP: Peter Arrow, MD Referred by: Peter Arrow, MD  Subjective: Chief Complaint  Patient presents with   Peter [name]   HPI: Peter [name] is a pleasant 89 y.o. male who presents today for bilateral lower extremity diabetic neuropathy.  He is a type II diabetic - he is currently diet-controlled.  He has had chronic diabetic neuropathy for years, slightly worse recently over months. He is managed on gabapentin 300 mg nightly with only some relief.  Wears supportive insoles help support the feet, but still has persisting symptoms.  He did notice some improvement after the first Qutenza application, he is here for his second application today.  Pertinent ROS were reviewed with the patient and found to be negative unless otherwise specified above in HPI.   Assessment & Plan: Visit Diagnoses:  1. Neuropathy involving both lower extremities   2. Type 2 diabetes mellitus with diabetic polyneuropathy, without long-term current use of insulin (HCC)    Plan: Impression is diabetic lower extremity neuropathy, we discussed continuing his gabapentin 300 mg nightly as well as other conservative and pharmacologic treatment options for his condition. Discussed Qutenza as an option for neuropathic pain control. Discussed that this is a capsaicin patch, stronger than capsaicin cream. Discussed that it is currently approved for diabetic peripheral neuropathy and post-herpetic neuralgia, but that it has also shown benefit in treating other forms of neuropathy. Discussed that the patch would be placed in office and benefits usually last 3 months. Discussed that unintended exposure to capsaicin can cause severe irritation of eyes, mucous membranes, respiratory tract, and skin, but that Qutenza is a local treatment and does not have the systemic side effects of other nerve medications. Discussed  that there may be pain, itching, erythema, and decreased sensory function associated with the application of Qutenza. Side effects usually subside within 1 week. A cold pack of analgesic medications can help with these side effects. Blood pressure can also be increased due to pain associated with administration of the patch.  He is agreeable to following up at every 3 month intervals, he will continue in his shoe inserts as well for support.  Follow-up: Return in about 3 months (around 09/15/2023) for for repeat Qutenza application.   Meds & Orders: No orders of the defined types were placed in this encounter.  No orders of the defined types were placed in this encounter.    Procedures: - 1 patch of Qutenza was applied to the area of foot pain, which included the dorsal and plantar aspect of each foot for a total of 4 Qutenza patches used to treat the distal peripheral nerve(s) of each foot with chemical denervation using Capsaicin 8% (Qutenza). Blood pressure was monitored every 15 minutes without abnormality. The patient tolerated the procedure well. Post-procedure instructions were given.        Clinical History: No specialty comments available.  He reports that he quit smoking about 48 years ago. His smoking use included cigarettes. He has never used smokeless tobacco.  Recent Labs    12/06/22 1020  HGBA1C 6.8*    Objective:   Vital Signs: BP 130/62 (BP Location: Right Arm, Patient Position: Sitting)   Pulse 88   Physical Exam  Gen: Well-appearing, in no acute distress; non-toxic CV: Well-perfused. Warm.  Resp: Breathing unlabored on room air; no wheezing. Psych: Fluid speech in conversation; appropriate affect; normal thought process  Neuro: Sensation intact throughout. No gross coordination deficits.   Ortho Exam -Bilateral feet: There is mild to moderate pes planus.  Hammertoe deformity of toes 2-3 bilaterally with right overlapping great toe.  There is sensation to light touch,  cap refill less than 2 seconds.  Imaging: No results found.  Past Medical/Family/Surgical/Social History: Medications & Allergies reviewed per EMR, new medications updated. Patient Active Problem List   Diagnosis Date Noted   Stage 3 chronic kidney disease (HCC) 04/16/2022   Fatigue 02/22/2022   Bilateral carotid artery disease (HCC) 02/22/2022   Acute pain of right foot 09/17/2021   Traumatic partial tear of right biceps tendon 06/30/2021   Rupture of pectoralis major muscle 06/30/2021   Greater trochanteric pain syndrome 01/24/2018   Hemiparesis affecting right side as late effect of cerebrovascular accident (HCC) 06/15/2017   Mixed hyperlipidemia 03/30/2017   Rotator cuff tear arthropathy of right shoulder 04/14/2016   Hemifacial spasm 02/12/2016   Abnormality of gait 02/12/2016   Increased prostate specific antigen (PSA) velocity 01/07/2016   Type 2 diabetes mellitus without complication (HCC) 12/18/2014   Full thickness rotator cuff tear, Left Shoulder 12/03/2014   Injury of left shoulder 08/22/2014   Facial nerve spasm 08/13/2014   Restlessness and agitation 08/13/2014   Allergic rhinitis 11/22/2012   Chest tightness 11/07/2012   Controlled type 2 diabetes mellitus with complication, without long-term current use of insulin (HCC) 01/21/2011   Pure hypercholesterolemia 01/21/2011   Essential hypertension 01/21/2011   Erectile dysfunction 01/21/2011   Past Medical History:  Diagnosis Date   Adenomatous colon polyp 07/2013   Asthma h/o as a child-s/p immunotherapy   Diabetes mellitus diet controlled   Facial nerve spasm 08/13/2014   Fatigue 02/22/2022   FHx: colon cancer    Hiatal hernia    Hypercholesteremia    Hypertension age 63   Internal hemorrhoids 07/2013   noted on colonoscopy   Restlessness and agitation 08/13/2014   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 09/18/2019   Dr.Whitfield   Family History  Problem Relation Age of Onset    Alcohol abuse Father    Cancer Sister        stomach/?pancreatic   Cancer Sister        lung and brain   Hypertension Sister    Colon cancer Brother 55   Cancer Brother        colon cancer in mid 53's   COPD Brother    Hypertension Brother    Diabetes Maternal Grandmother    Arthritis Daughter        psoriatic   Arthritis Daughter        psoriatic   Hyperlipidemia Son    Hyperlipidemia Son    Stroke Neg Hx    Heart disease Neg Hx    Past Surgical History:  Procedure Laterality Date   APPENDECTOMY     BLEPHAROPLASTY Bilateral 12/13/2016   CARDIOVASCULAR STRESS TEST  normal   CATARACT EXTRACTION, BILATERAL  07/2014, 08/2014   COLONOSCOPY  3/08, 07/2013   no repeat rec due to age per Dr. 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)  Tobacco Use   Smoking status: Former    Current packs/day: 0.00    Types: Cigarettes    Quit date: 08/09/1974    Years since quitting: 48.8   Smokeless tobacco: Never  Vaping Use   Vaping status: Never Used  Substance and Sexual Activity   Alcohol use: Yes    Alcohol/week: 8.0 standard drinks of alcohol    Types: 8 Cans of beer per week    Comment: 8 beers/week   Drug use: No   Sexual activity: Yes    Partners: Female   I spent 39 minutes in the care of the patient today including face-to-face time, preparation to see the patient, as well as discussion on risk/benefits/indication for Qutenza, time spent for application, pre and post care instructions, reevaluation during active treatment, discussion on recent research and application for his diabetic neuropathy and alternative treatment options for the above diagnoses.   Madelyn Brunner, DO Primary Care Sports Medicine Physician  [facility]  This note was dictated using Dragon naturally speaking software and may contain errors in syntax, spelling, or content which have not been identified prior to signing this  note.

## 2023-06-16 DIAGNOSIS — M25519 Pain in unspecified shoulder: Secondary | ICD-10-CM | POA: Diagnosis not present

## 2023-06-23 DIAGNOSIS — M25519 Pain in unspecified shoulder: Secondary | ICD-10-CM | POA: Diagnosis not present

## 2023-06-30 DIAGNOSIS — M25519 Pain in unspecified shoulder: Secondary | ICD-10-CM | POA: Diagnosis not present

## 2023-07-05 DIAGNOSIS — M25519 Pain in unspecified shoulder: Secondary | ICD-10-CM | POA: Diagnosis not present

## 2023-07-12 ENCOUNTER — Other Ambulatory Visit (INDEPENDENT_AMBULATORY_CARE_PROVIDER_SITE_OTHER): Payer: Self-pay

## 2023-07-12 ENCOUNTER — Encounter: Payer: Self-pay | Admitting: Sports Medicine

## 2023-07-12 ENCOUNTER — Ambulatory Visit: Payer: Medicare Other | Admitting: Sports Medicine

## 2023-07-12 DIAGNOSIS — M12812 Other specific arthropathies, not elsewhere classified, left shoulder: Secondary | ICD-10-CM

## 2023-07-12 DIAGNOSIS — M25512 Pain in left shoulder: Secondary | ICD-10-CM

## 2023-07-12 DIAGNOSIS — G8929 Other chronic pain: Secondary | ICD-10-CM | POA: Diagnosis not present

## 2023-07-12 MED ORDER — BUPIVACAINE HCL 0.25 % IJ SOLN
2.0000 mL | INTRAMUSCULAR | Status: AC | PRN
  Administered 2023-07-12: 2 mL via INTRA_ARTICULAR

## 2023-07-12 MED ORDER — METHYLPREDNISOLONE ACETATE 40 MG/ML IJ SUSP
40.0000 mg | INTRAMUSCULAR | Status: AC | PRN
  Administered 2023-07-12: 40 mg via INTRA_ARTICULAR

## 2023-07-12 MED ORDER — LIDOCAINE HCL 1 % IJ SOLN
2.0000 mL | INTRAMUSCULAR | Status: AC | PRN
  Administered 2023-07-12: 2 mL

## 2023-07-12 NOTE — Progress Notes (Signed)
Peter [name] - 89 y.o. male MRN [id number]  Date of birth: Jul 06, 1930  Office Visit Note: Visit Date: 07/12/2023 PCP: Joselyn Arrow, MD Referred by: Joselyn Arrow, MD  Subjective: Chief Complaint  Patient presents with   Left Shoulder - Pain   HPI: Peter [name] is a pleasant 89 y.o. male who presents today for chronic left shoulder pain.  He has a history of both right and left shoulder pain.  Years ago he had an injection into the left shoulder by both Dr. Darrick Penna and then myself which gave him good relief for period of time.  Here recently over the last few weeks the left shoulder has started to bother him.  At times he has limited range of motion but today is not too bad.  He has pain with laying on that side of the shoulder.  He is not taking any consistent medication for this.  Pertinent ROS were reviewed with the patient and found to be negative unless otherwise specified above in HPI.   Assessment & Plan: Visit Diagnoses:  1. Chronic left shoulder pain   2. Rotator cuff arthropathy of left shoulder    Plan: Impression is acute on chronic left shoulder pain in the setting of likely significant rotator cuff tearing given his high riding humeral head on x-ray.  Through shared decision-making, we did proceed with subacromial joint injection, which she tolerated well.  He is not interested in formalized physical therapy or home rehab at this point, but we may consider this if not getting good relief from the injection.  Okay for Tylenol, topical medications and ice/heat for any postinjection pain.  He will get back into the gym and his physical activity after 48 hours of modified rest.  He will follow-up with me as needed.  If does not receive significant relief, could consider an US-GHJ injection vs. Diagnostic US of shoulder in future.  Follow-up: Return if symptoms worsen or fail to improve.   Meds & Orders: No orders of the defined types were placed in this encounter.    Orders Placed This Encounter  Procedures   Large Joint Inj   XR Shoulder Left     Procedures: Large Joint Inj: L subacromial bursa on 07/12/2023 2:00 PM Indications: pain Details: 22 G 1.5 in needle, posterior approach Medications: 2 mL lidocaine 1 %; 2 mL bupivacaine 0.25 %; 40 mg methylPREDNISolone acetate 40 MG/ML Outcome: tolerated well, no immediate complications  Subacromial Joint Injection, Left Shoulder After discussion on risks/benefits/indications, informed verbal consent was obtained. A timeout was then performed. Patient was seated on table in exam room. The patient's shoulder was prepped with betadine and alcohol swabs and utilizing posterior approach a 22G, 1.5" needle was directed anteriorly and laterally into the patient's subacromial space was injected with 2:2:1 mixture of lidocaine:bupivicaine:depomedrol with appreciation of free-flowing of the injectate into the bursal space. Patient tolerated the procedure well without immediate complications.   Procedure, treatment alternatives, risks and benefits explained, specific risks discussed. Consent was given by the patient. Immediately prior to procedure a time out was called to verify the correct patient, procedure, equipment, support staff and site/side marked as required. Patient was prepped and draped in the usual sterile fashion.          Clinical History: No specialty comments available.  He reports that he quit smoking about 48 years ago. His smoking use included cigarettes. He has never used smokeless tobacco.  Recent Labs    12/06/22 1020  HGBA1C  6.8*    Objective:    Physical Exam  Gen: Well-appearing, in no acute distress; non-toxic CV: Well-perfused. Warm.  Resp: Breathing unlabored on room air; no wheezing. Psych: Fluid speech in conversation; appropriate affect; normal thought process Neuro: Sensation intact throughout. No gross coordination deficits.   Ortho Exam - Left shoulder: Positive TTP at  Codman's point.  There is restriction in forward flexion and abduction of 140 and 115 degrees respectively.  Unable to take into near full range of motion passively.  Positive drop arm, positive empty can testing with some weakness with resisted external rotation.  Imaging: XR Shoulder Left  Result Date: 07/12/2023 3 views of the left shoulder including Grashey, scapular Y and axial view were ordered and reviewed by myself.  X-rays demonstrate a high riding humeral head indicative of rotator cuff tearing.  There is some mild bony sclerosis of the lesser tubercle of the humeral head indicative of likely impingement syndrome.  No acute fracture or otherwise bony abnormality noted.   Past Medical/Family/Surgical/Social History: Medications & Allergies reviewed per EMR, new medications updated. Patient Active Problem List   Diagnosis Date Noted   Stage 3 chronic kidney disease (HCC) 04/16/2022   Fatigue 02/22/2022   Bilateral carotid artery disease (HCC) 02/22/2022   Acute pain of right foot 09/17/2021   Traumatic partial tear of right biceps tendon 06/30/2021   Rupture of pectoralis major muscle 06/30/2021   Greater trochanteric pain syndrome 01/24/2018   Hemiparesis affecting right side as late effect of cerebrovascular accident (HCC) 06/15/2017   Mixed hyperlipidemia 03/30/2017   Rotator cuff tear arthropathy of right shoulder 04/14/2016   Hemifacial spasm 02/12/2016   Abnormality of gait 02/12/2016   Increased prostate specific antigen (PSA) velocity 01/07/2016   Type 2 diabetes mellitus without complication (HCC) 12/18/2014   Full thickness rotator cuff tear, Left Shoulder 12/03/2014   Injury of left shoulder 08/22/2014   Facial nerve spasm 08/13/2014   Restlessness and agitation 08/13/2014   Allergic rhinitis 11/22/2012   Chest tightness 11/07/2012   Controlled type 2 diabetes mellitus with complication, without long-term current use of insulin (HCC) 01/21/2011   Pure  hypercholesterolemia 01/21/2011   Essential hypertension 01/21/2011   Erectile dysfunction 01/21/2011   Past Medical History:  Diagnosis Date   Adenomatous colon polyp 07/2013   Asthma h/o as a child-s/p immunotherapy   Diabetes mellitus diet controlled   Facial nerve spasm 08/13/2014   Fatigue 02/22/2022   FHx: colon cancer    Hiatal hernia    Hypercholesteremia    Hypertension age 77   Internal hemorrhoids 07/2013   noted on colonoscopy   Restlessness and agitation 08/13/2014   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 09/18/2019   Dr.Whitfield   Family History  Problem Relation Age of Onset   Alcohol abuse Father    Cancer Sister        stomach/?pancreatic   Cancer Sister        lung and brain   Hypertension Sister    Colon cancer Brother 46   Cancer Brother        colon cancer in mid 10's   COPD Brother    Hypertension Brother    Diabetes Maternal Grandmother    Arthritis Daughter        psoriatic   Arthritis Daughter        psoriatic   Hyperlipidemia Son    Hyperlipidemia Son    Stroke Neg Hx    Heart disease  Neg Hx    Past Surgical History:  Procedure Laterality Date   APPENDECTOMY     BLEPHAROPLASTY Bilateral 12/13/2016   CARDIOVASCULAR STRESS TEST  normal   CATARACT EXTRACTION, BILATERAL  07/2014, 08/2014   COLONOSCOPY  3/08, 07/2013   no repeat rec due to age per Dr. 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

## 2023-07-14 DIAGNOSIS — M25519 Pain in unspecified shoulder: Secondary | ICD-10-CM | POA: Diagnosis not present

## 2023-07-21 DIAGNOSIS — M25519 Pain in unspecified shoulder: Secondary | ICD-10-CM | POA: Diagnosis not present

## 2023-07-28 DIAGNOSIS — M25519 Pain in unspecified shoulder: Secondary | ICD-10-CM | POA: Diagnosis not present

## 2023-08-18 DIAGNOSIS — M25519 Pain in unspecified shoulder: Secondary | ICD-10-CM | POA: Diagnosis not present

## 2023-08-19 ENCOUNTER — Encounter: Payer: Self-pay | Admitting: Sports Medicine

## 2023-08-19 ENCOUNTER — Ambulatory Visit (INDEPENDENT_AMBULATORY_CARE_PROVIDER_SITE_OTHER): Payer: Medicare Other | Admitting: Sports Medicine

## 2023-08-19 DIAGNOSIS — G5793 Unspecified mononeuropathy of bilateral lower limbs: Secondary | ICD-10-CM | POA: Diagnosis not present

## 2023-08-19 DIAGNOSIS — M25561 Pain in right knee: Secondary | ICD-10-CM | POA: Diagnosis not present

## 2023-08-19 DIAGNOSIS — M170 Bilateral primary osteoarthritis of knee: Secondary | ICD-10-CM | POA: Diagnosis not present

## 2023-08-19 DIAGNOSIS — E1142 Type 2 diabetes mellitus with diabetic polyneuropathy: Secondary | ICD-10-CM

## 2023-08-19 DIAGNOSIS — G8929 Other chronic pain: Secondary | ICD-10-CM

## 2023-08-19 MED ORDER — LIDOCAINE HCL 1 % IJ SOLN
2.0000 mL | INTRAMUSCULAR | Status: AC | PRN
  Administered 2023-08-19: 2 mL

## 2023-08-19 MED ORDER — METHYLPREDNISOLONE ACETATE 40 MG/ML IJ SUSP
40.0000 mg | INTRAMUSCULAR | Status: AC | PRN
  Administered 2023-08-19: 40 mg via INTRA_ARTICULAR

## 2023-08-19 MED ORDER — BUPIVACAINE HCL 0.25 % IJ SOLN
2.0000 mL | INTRAMUSCULAR | Status: AC | PRN
  Administered 2023-08-19: 2 mL via INTRA_ARTICULAR

## 2023-08-19 NOTE — Progress Notes (Signed)
 Peter [name] - 89 y.o. male MRN [id number]  Date of birth: 12/03/1929  Office Visit Note: Visit Date: 08/19/2023 PCP: Randol Dawes, MD Referred by: Randol Dawes, MD  Subjective: Chief Complaint  Patient presents with   Right Knee - Pain   HPI: Peter [name] is a pleasant 89 y.o. male who presents today for acute on chronic right knee pain with known OA.  Does have mild to moderate osteoarthritis of both knees.  History of 3 months ago we did proceed with right knee corticosteroid injection, this has been helpful for him.  His right knee is usually worse than the left knee.   Lower extremity neuropathy with type 2 diabetes -we have performed 2 treatments of Qutenza  application for his bilateral feet.  He does state that he certainly has noticed improvement after our last application back in November.  We are planning on repeating this upcoming in February.  He still continues on gabapentin  300 mg nightly.  Symptoms are still present but certainly improved with both treatments.  Pertinent ROS were reviewed with the patient and found to be negative unless otherwise specified above in HPI.   Assessment & Plan: Visit Diagnoses:  1. Chronic pain of right knee   2. Bilateral primary osteoarthritis of knee   3. Neuropathy involving both lower extremities   4. Type 2 diabetes mellitus with diabetic polyneuropathy, without long-term current use of insulin (HCC)    Plan: Impression is bilateral osteoarthritis of the knees with an exacerbation of the right knee.  Through shared decision-making, did proceed with corticosteroid injection, patient tolerated well. Peter [name]'s lower extremity diabetic neuropathy has been making noticeable improvements with Qutenza  application every 3 months as well as his gabapentin  300 mg nightly.  He will continue with gabapentin .  We will plan for a third treatment of Qutenza  upcoming in February.  He may use ice/heat, Tylenol for any postinjection pain for his  knees.  Follow-up: Return for has f/u in Feb for Qutenza .   Meds & Orders: No orders of the defined types were placed in this encounter.   Orders Placed This Encounter  Procedures   Large Joint Inj     Procedures: Large Joint Inj: R knee on 08/19/2023 8:31 AM Indications: pain Details: 22 G 1.5 in needle, anterolateral approach Medications: 2 mL lidocaine  1 %; 2 mL bupivacaine  0.25 %; 40 mg methylPREDNISolone  acetate 40 MG/ML Outcome: tolerated well, no immediate complications  Knee Injection, Right: After discussion on risks/benefits/indications, informed verbal consent was obtained and a timeout was performed, patient was seated on exam table. The patient's knee was prepped with Betadine and alcohol swab and utilizing anterolateral approach, the patient's knee was injected intraarticularly with 2:2:1 lidocaine  1%:bupivicaine 0.25%:depomedrol. Patient tolerated the procedure well without immediate complications.  Procedure, treatment alternatives, risks and benefits explained, specific risks discussed. Consent was given by the patient. Patient was prepped and draped in the usual sterile fashion.          Clinical History: No specialty comments available.  He reports that he quit smoking about 49 years ago. His smoking use included cigarettes. He has never used smokeless tobacco.  Recent Labs    12/06/22 1020  HGBA1C 6.8*    Objective:    Physical Exam  Gen: Well-appearing, in no acute distress; non-toxic CV: Well-perfused. Warm.  Resp: Breathing unlabored on room air; no wheezing. Psych: Fluid speech in conversation; appropriate affect; normal thought process  Ortho Exam - Bilateral knees: There is a trace effusion  on the right knee, no effusion of the left knee.  There is notable crepitus right greater than left.  Range of motion of the right knee 0-120 degrees compared to 0-130 degrees of the left knee.  There is some pseudo instability with varus/valgus testing  although no significant giveaway.  Good strength with flexion and extension.  Imaging:  10/15/22: 4 views of the right knee including AP, Rosenberg, lateral and sunrise  views were ordered and reviewed by myself.  X-rays demonstrate mild to  moderate tricompartmental osteoarthritic change.  Peter [name] view does show  collapse of the lateral joint line with near bone-on-bone arthritic change  of the lateral side.  There is some tibial spurring off the lateral  aspect.  No acute bony fracture noted.    Past Medical/Family/Surgical/Social History: Medications & Allergies reviewed per EMR, new medications updated. Patient Active Problem List   Diagnosis Date Noted   Stage 3 chronic kidney disease (HCC) 04/16/2022   Fatigue 02/22/2022   Bilateral carotid artery disease (HCC) 02/22/2022   Acute pain of right foot 09/17/2021   Traumatic partial tear of right biceps tendon 06/30/2021   Rupture of pectoralis major muscle 06/30/2021   Greater trochanteric pain syndrome 01/24/2018   Hemiparesis affecting right side as late effect of cerebrovascular accident (HCC) 06/15/2017   Mixed hyperlipidemia 03/30/2017   Rotator cuff tear arthropathy of right shoulder 04/14/2016   Hemifacial spasm 02/12/2016   Abnormality of gait 02/12/2016   Increased prostate specific antigen (PSA) velocity 01/07/2016   Type 2 diabetes mellitus without complication (HCC) 12/18/2014   Full thickness rotator cuff tear, Left Shoulder 12/03/2014   Injury of left shoulder 08/22/2014   Facial nerve spasm 08/13/2014   Restlessness and agitation 08/13/2014   Allergic rhinitis 11/22/2012   Chest tightness 11/07/2012   Controlled type 2 diabetes mellitus with complication, without long-term current use of insulin (HCC) 01/21/2011   Pure hypercholesterolemia 01/21/2011   Essential hypertension 01/21/2011   Erectile dysfunction 01/21/2011   Past Medical History:  Diagnosis Date   Adenomatous colon polyp 07/2013   Asthma h/o  as a child-s/p immunotherapy   Diabetes mellitus diet controlled   Facial nerve spasm 08/13/2014   Fatigue 02/22/2022   FHx: colon cancer    Hiatal hernia    Hypercholesteremia    Hypertension age 76   Internal hemorrhoids 07/2013   noted on colonoscopy   Restlessness and agitation 08/13/2014   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 09/18/2019   Dr.Whitfield   Family History  Problem Relation Age of Onset   Alcohol abuse Father    Cancer Sister        stomach/?pancreatic   Cancer Sister        lung and brain   Hypertension Sister    Colon cancer Brother 87   Cancer Brother        colon cancer in mid 52's   COPD Brother    Hypertension Brother    Diabetes Maternal Grandmother    Arthritis Daughter        psoriatic   Arthritis Daughter        psoriatic   Hyperlipidemia Son    Hyperlipidemia Son    Stroke Neg Hx    Heart disease Neg Hx    Past Surgical History:  Procedure Laterality Date   APPENDECTOMY     BLEPHAROPLASTY Bilateral 12/13/2016   CARDIOVASCULAR STRESS TEST  normal   CATARACT EXTRACTION, BILATERAL  07/2014, 08/2014   COLONOSCOPY  3/08, 07/2013   no repeat rec due to age per Dr. 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

## 2023-08-25 ENCOUNTER — Other Ambulatory Visit: Payer: Self-pay | Admitting: Sports Medicine

## 2023-08-25 DIAGNOSIS — G5793 Unspecified mononeuropathy of bilateral lower limbs: Secondary | ICD-10-CM

## 2023-08-25 DIAGNOSIS — E1142 Type 2 diabetes mellitus with diabetic polyneuropathy: Secondary | ICD-10-CM

## 2023-08-25 DIAGNOSIS — M25519 Pain in unspecified shoulder: Secondary | ICD-10-CM | POA: Diagnosis not present

## 2023-08-29 ENCOUNTER — Other Ambulatory Visit (HOSPITAL_COMMUNITY): Payer: Self-pay

## 2023-08-30 ENCOUNTER — Encounter: Payer: Self-pay | Admitting: Sports Medicine

## 2023-08-30 ENCOUNTER — Ambulatory Visit: Payer: Medicare Other | Admitting: Sports Medicine

## 2023-08-30 DIAGNOSIS — M170 Bilateral primary osteoarthritis of knee: Secondary | ICD-10-CM | POA: Diagnosis not present

## 2023-08-30 DIAGNOSIS — R600 Localized edema: Secondary | ICD-10-CM

## 2023-08-30 DIAGNOSIS — M25561 Pain in right knee: Secondary | ICD-10-CM

## 2023-08-30 DIAGNOSIS — G5793 Unspecified mononeuropathy of bilateral lower limbs: Secondary | ICD-10-CM

## 2023-08-30 DIAGNOSIS — G8929 Other chronic pain: Secondary | ICD-10-CM

## 2023-08-30 NOTE — Progress Notes (Signed)
Patient says that he is still having pain after the last injection. He says that his aching pain did lessen after the injection, but he has sharp and shooting pain in the knee and down the side of the leg. He says that this can also cause the knee to buckle, so he is careful with where he steps when he walks.

## 2023-08-30 NOTE — Progress Notes (Signed)
Peter [name] - 89 y.o. male MRN [id number]  Date of birth: 04/28/1930  Office Visit Note: Visit Date: 08/30/2023 PCP: Joselyn Arrow, MD Referred by: Joselyn Arrow, MD  Subjective: Chief Complaint  Patient presents with   Right Knee - Pain, Follow-up   HPI: Peter [name] is a pleasant 89 y.o. male who presents today for follow-up of acute on chronic right knee pain, also with lower extremity diabetic neuropathy.  Right knee - Gene does have advanced arthritic changes tricompartmentally but does have bone-on-bone collapse of the lateral joint line.  A few weeks ago we did proceed with right knee corticosteroid injection as this has helped in the past.  It was helpful initially but he only had temporary relief and feels like it is starting to wear off now.  More bothersome he is getting some clicking and some instability over the lateral compartment of the knee.  He does have lower extremity neuropathy with type 2 diabetes -he has made good improvements with Qutenza application for bilateral feet.  He continues on gabapentin 300 mg nightly.  He does have an upcoming Qutenza application in early February.  Pertinent ROS were reviewed with the patient and found to be negative unless otherwise specified above in HPI.   Assessment & Plan: Visit Diagnoses:  1. Bilateral primary osteoarthritis of knee   2. Chronic pain of right knee   3. Neuropathy involving both lower extremities   4. Bilateral lower extremity edema    Plan: Impression is acute exacerbation of chronic right knee advanced osteoarthritis.  He has bone-on-bone change of the lateral compartment and is having some instability associated with this.  Corticosteroid injections have helped but only temporary and have waned in his back here recently.  Based on his gait, I do think it would be helpful for stability bracing for the knee.  We did fit him for a lateral unloader brace today, which he will wear when walking and  upright.  I think he would benefit from hyaluronic acid injections in supplementation, we will send for authorization for this.  For his neuropathy, he will continue gabapentin 300 mg nightly and plan for his third treatment of Qutenza upcoming in February.  Recommended use of compression socks for his lower extremity edema as he desires.  This patient is diagnosed with osteoarthritis of both knees, but right knee most bothersome.  Radiographs show evidence of joint space narrowing, osteophytes, subchondral sclerosis and/or subchondral cysts.  This patient has knee pain which interferes with functional and activities of daily living.    This patient has experienced inadequate response, adverse effects and/or intolerance with conservative treatments such as acetaminophen, NSAIDS, topical creams, physical therapy or regular exercise, knee bracing and/or weight loss.   This patient has experienced inadequate response or has a contraindication to intra articular steroid injections for at least 3 months.   This patient is not scheduled to have a total knee replacement within 6 months of starting treatment with viscosupplementation.   Follow-up: Return for will call once visco gets approved.   Meds & Orders: No orders of the defined types were placed in this encounter.  No orders of the defined types were placed in this encounter.    Procedures: No procedures performed      Clinical History: No specialty comments available.  He reports that he quit smoking about 49 years ago. His smoking use included cigarettes. He has never used smokeless tobacco.  Recent Labs    12/06/22 1020  HGBA1C 6.8*    Objective:    Physical Exam  Gen: Well-appearing, in no acute distress; non-toxic CV: Well-perfused. Warm.  Resp: Breathing unlabored on room air; no wheezing. Psych: Fluid speech in conversation; appropriate affect; normal thought process  Ortho Exam - Right knee: No significant joint line  TTP.  There is some restriction in range of motion from 0-120 degrees.  There is pseudo instability with varus/valgus stress testing although no significant giveaway. He does have a degree of horizontal shimmy of the right knee through his gait. Mild trendelenburg but walks unassisted.  - Bilateral legs: There is 1+ edema from the proximal shin down to the feet bilaterally.  Imaging:  10/15/22: 4 views of the right knee including AP, Rosenberg, lateral and sunrise  views were ordered and reviewed by myself.  X-rays demonstrate mild to  moderate tricompartmental osteoarthritic change.  Zoila Shutter view does show  collapse of the lateral joint line with near bone-on-bone arthritic change  of the lateral side.  There is some tibial spurring off the lateral  aspect.  No acute bony fracture noted.   Past Medical/Family/Surgical/Social History: Medications & Allergies reviewed per EMR, new medications updated. Patient Active Problem List   Diagnosis Date Noted   Stage 3 chronic kidney disease (HCC) 04/16/2022   Fatigue 02/22/2022   Bilateral carotid artery disease (HCC) 02/22/2022   Acute pain of right foot 09/17/2021   Traumatic partial tear of right biceps tendon 06/30/2021   Rupture of pectoralis major muscle 06/30/2021   Greater trochanteric pain syndrome 01/24/2018   Hemiparesis affecting right side as late effect of cerebrovascular accident (HCC) 06/15/2017   Mixed hyperlipidemia 03/30/2017   Rotator cuff tear arthropathy of right shoulder 04/14/2016   Hemifacial spasm 02/12/2016   Abnormality of gait 02/12/2016   Increased prostate specific antigen (PSA) velocity 01/07/2016   Type 2 diabetes mellitus without complication (HCC) 12/18/2014   Full thickness rotator cuff tear, Left Shoulder 12/03/2014   Injury of left shoulder 08/22/2014   Facial nerve spasm 08/13/2014   Restlessness and agitation 08/13/2014   Allergic rhinitis 11/22/2012   Chest tightness 11/07/2012   Controlled type 2  diabetes mellitus with complication, without long-term current use of insulin (HCC) 01/21/2011   Pure hypercholesterolemia 01/21/2011   Essential hypertension 01/21/2011   Erectile dysfunction 01/21/2011   Past Medical History:  Diagnosis Date   Adenomatous colon polyp 07/2013   Asthma h/o as a child-s/p immunotherapy   Diabetes mellitus diet controlled   Facial nerve spasm 08/13/2014   Fatigue 02/22/2022   FHx: colon cancer    Hiatal hernia    Hypercholesteremia    Hypertension age 3   Internal hemorrhoids 07/2013   noted on colonoscopy   Restlessness and agitation 08/13/2014   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 09/18/2019   Dr.Whitfield   Family History  Problem Relation Age of Onset   Alcohol abuse Father    Cancer Sister        stomach/?pancreatic   Cancer Sister        lung and brain   Hypertension Sister    Colon cancer Brother 26   Cancer Brother        colon cancer in mid 36's   COPD Brother    Hypertension Brother    Diabetes Maternal Grandmother    Arthritis Daughter        psoriatic   Arthritis Daughter        psoriatic   Hyperlipidemia Son  Hyperlipidemia Son    Stroke Neg Hx    Heart disease Neg Hx    Past Surgical History:  Procedure Laterality Date   APPENDECTOMY     BLEPHAROPLASTY Bilateral 12/13/2016   CARDIOVASCULAR STRESS TEST  normal   CATARACT EXTRACTION, BILATERAL  07/2014, 08/2014   COLONOSCOPY  3/08, 07/2013   no repeat rec due to age per Dr. 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

## 2023-09-01 DIAGNOSIS — M25519 Pain in unspecified shoulder: Secondary | ICD-10-CM | POA: Diagnosis not present

## 2023-09-02 ENCOUNTER — Other Ambulatory Visit (HOSPITAL_COMMUNITY): Payer: Self-pay

## 2023-09-02 MED ORDER — QUTENZA (4 PATCH) 8 % EX KIT
4.0000 | PACK | Freq: Once | CUTANEOUS | 0 refills | Status: DC
  Filled 2023-09-02 – 2023-09-05 (×2): qty 4, 1d supply, fill #0
  Filled 2023-09-06: qty 4, 28d supply, fill #0
  Filled 2023-09-07: qty 1, 28d supply, fill #0
  Filled 2024-01-25: qty 1, 28d supply, fill #1

## 2023-09-05 ENCOUNTER — Encounter (HOSPITAL_COMMUNITY): Payer: Self-pay

## 2023-09-05 ENCOUNTER — Ambulatory Visit: Payer: Medicare Other | Admitting: Sports Medicine

## 2023-09-05 ENCOUNTER — Other Ambulatory Visit (HOSPITAL_COMMUNITY): Payer: Self-pay

## 2023-09-05 ENCOUNTER — Other Ambulatory Visit: Payer: Self-pay

## 2023-09-05 NOTE — Progress Notes (Signed)
Pharmacy Patient Advocate Encounter   Received notification from Patient Pharmacy that prior authorization for Qutenza is required/requested.   Insurance verification completed.   The patient is insured through [employer] .   Per test claim: PA required; PA submitted to above mentioned insurance via CoverMyMeds Key/confirmation #/EOC [id number] Status is pending

## 2023-09-06 ENCOUNTER — Encounter (HOSPITAL_COMMUNITY): Payer: Self-pay

## 2023-09-06 ENCOUNTER — Other Ambulatory Visit: Payer: Self-pay

## 2023-09-06 ENCOUNTER — Other Ambulatory Visit (HOSPITAL_COMMUNITY): Payer: Self-pay

## 2023-09-06 NOTE — Progress Notes (Signed)
Pharmacy Patient Advocate Encounter  Insurance verification completed.   The patient is insured through [facility]   Ran test claim for Qutenza. Currently a quantity of 4 is a 28 day supply and the co-pay is $99.   This test claim was processed through [facility]- copay amounts may vary at other pharmacies due to pharmacy/plan contracts, or as the patient moves through the different stages of their insurance plan.

## 2023-09-06 NOTE — Progress Notes (Signed)
Specialty Pharmacy Initial Fill Coordination Note  Gene Gorrell is a 89 y.o. male contacted today regarding initial fill of specialty medication(s) Capsaicin-Cleansing Gel (Qutenza (4 Patch))   Patient requested Courier to Provider Office   Delivery date: 09/12/23   Verified address: [facility]- [address]   Medication will be filled on 1/31.   Patient is aware of $99 copayment.

## 2023-09-06 NOTE — Progress Notes (Signed)
Pharmacy Patient Advocate Encounter  Received notification from [facility] [employer] that Prior Authorization for Qutenza has been APPROVED from 09/04/22 to 09/04/24   PA #/Case ID/Reference #:  [id number]

## 2023-09-07 ENCOUNTER — Other Ambulatory Visit: Payer: Self-pay

## 2023-09-08 DIAGNOSIS — M25519 Pain in unspecified shoulder: Secondary | ICD-10-CM | POA: Diagnosis not present

## 2023-09-09 ENCOUNTER — Other Ambulatory Visit: Payer: Self-pay

## 2023-09-12 ENCOUNTER — Telehealth: Payer: Self-pay | Admitting: Sports Medicine

## 2023-09-12 NOTE — Telephone Encounter (Signed)
Patient's son called. Would like to know if the approval for the gel injection was given?

## 2023-09-14 ENCOUNTER — Telehealth: Payer: Self-pay

## 2023-09-14 NOTE — Telephone Encounter (Signed)
 VOB submitted for Orthovisc, right knee

## 2023-09-14 NOTE — Telephone Encounter (Signed)
 Talked with patients son and appt.has been scheduled for gel injection

## 2023-09-14 NOTE — Telephone Encounter (Signed)
-----   Message from [place] W sent at 08/30/2023  2:58 PM EST ----- Regarding: Gel Injections Right Knee Sherin Murdoch,  Could we please get prior authorization for this patient for gel injections for the right knee?  Thank you, Lonzell Robin

## 2023-09-15 ENCOUNTER — Ambulatory Visit: Payer: Medicare Other | Admitting: Orthopedic Surgery

## 2023-09-15 ENCOUNTER — Ambulatory Visit: Payer: Medicare Other | Admitting: Sports Medicine

## 2023-09-22 DIAGNOSIS — M25519 Pain in unspecified shoulder: Secondary | ICD-10-CM | POA: Diagnosis not present

## 2023-09-29 ENCOUNTER — Other Ambulatory Visit: Payer: Self-pay

## 2023-09-29 DIAGNOSIS — M25519 Pain in unspecified shoulder: Secondary | ICD-10-CM | POA: Diagnosis not present

## 2023-09-29 DIAGNOSIS — M1711 Unilateral primary osteoarthritis, right knee: Secondary | ICD-10-CM

## 2023-10-04 ENCOUNTER — Other Ambulatory Visit (HOSPITAL_COMMUNITY): Payer: Self-pay

## 2023-10-04 ENCOUNTER — Encounter: Payer: Self-pay | Admitting: Sports Medicine

## 2023-10-04 ENCOUNTER — Ambulatory Visit: Payer: Medicare Other | Admitting: Sports Medicine

## 2023-10-04 DIAGNOSIS — M25561 Pain in right knee: Secondary | ICD-10-CM

## 2023-10-04 DIAGNOSIS — G8929 Other chronic pain: Secondary | ICD-10-CM | POA: Diagnosis not present

## 2023-10-04 DIAGNOSIS — M1711 Unilateral primary osteoarthritis, right knee: Secondary | ICD-10-CM

## 2023-10-04 DIAGNOSIS — G5793 Unspecified mononeuropathy of bilateral lower limbs: Secondary | ICD-10-CM

## 2023-10-04 MED ORDER — HYALURONAN 30 MG/2ML IX SOSY
30.0000 mg | PREFILLED_SYRINGE | INTRA_ARTICULAR | Status: AC | PRN
  Administered 2023-10-04: 30 mg via INTRA_ARTICULAR

## 2023-10-04 NOTE — Progress Notes (Signed)
 Patient says that the left knee is still feeling okay but the right knee is still bothersome. He says that he has worn the brace a couple of times but it did not seem to help much and was a bit bulky so he has stopped wearing it.

## 2023-10-04 NOTE — Progress Notes (Signed)
 Peter [name] - 89 y.o. male MRN [id number]  Date of birth: June 15, 1930  Office Visit Note: Visit Date: 10/04/2023 PCP: Joselyn Arrow, MD Referred by: Joselyn Arrow, MD  Subjective: Chief Complaint  Patient presents with   Right Knee - Pain   HPI: Peter [name] is a pleasant 89 y.o. male who presents today for first round of viscosupplementation for the right knee; f/u on diabetic neuropathy.   Right knee OA -pain comes and goes.  His left knee is still doing well.  He is interested in proceeding with Orthovisc injection today. Did take Tylenol beforehand.  He does have his Qutenza appt set up - plan for this on 11/01/23. He has had prior treatments thus far and is definitely noticing some improvement in symptoms.  He continues on gabapentin 300 mg daily.  Pertinent ROS were reviewed with the patient and found to be negative unless otherwise specified above in HPI.   Assessment & Plan: Visit Diagnoses:  1. Unilateral primary osteoarthritis, right knee   2. Chronic pain of right knee   3. Neuropathy involving both lower extremities    Plan: Impression is symptomatic right knee OA, through shared decision making we did proceed with Orthovisc injection today into the right knee, patient tolerated well.  We discussed postinjection protocol.  In terms of his diabetic neuropathy, he has been making strides with Qutenza as well as gabapentin 300 mg daily.  He will continue his gabapentin and we do have him set up for March for repeat Qutenza application for the bilateral feet.  He will follow-up next week for Orthovisc injection only.  Follow-up: Return in about 8 days (around 10/12/2023) for 10:30 slot (ok to double book - per Shon Baton).   Meds & Orders: No orders of the defined types were placed in this encounter.  No orders of the defined types were placed in this encounter.    Procedures: Large Joint Inj: R knee on 10/04/2023 8:45 AM Details: 22 G 1.5 in needle, anterolateral  approach Medications: 30 mg Hyaluronan 30 MG/2ML Outcome: tolerated well, no immediate complications  Knee Injection, Right: After discussion on risks/benefits/indications, informed verbal consent was obtained and a timeout was performed, patient was seated on exam table. The patient's knee was prepped with Betadine and alcohol swab and utilizing anterolateral approach, the patient's knee was injected intraarticularly with 2 mL of Orthovisc (hyaluronic acid). Patient tolerated the procedure well without immediate complications.  Procedure, treatment alternatives, risks and benefits explained, specific risks discussed. Consent was given by the patient. Patient was prepped and draped in the usual sterile fashion.          Clinical History: No specialty comments available.  He reports that he quit smoking about 49 years ago. His smoking use included cigarettes. He has never used smokeless tobacco.  Recent Labs    12/06/22 1020  HGBA1C 6.8*    Objective:    Physical Exam  Gen: Well-appearing, in no acute distress; non-toxic CV: Well-perfused. Warm.  Resp: Breathing unlabored on room air; no wheezing. Psych: Fluid speech in conversation; appropriate affect; normal thought process  Ortho Exam - Right knee: No significant effusion, there is some mild patellar crepitus.  ROM 0-130 degrees.  Imaging: No results found.  Past Medical/Family/Surgical/Social History: Medications & Allergies reviewed per EMR, new medications updated. Patient Active Problem List   Diagnosis Date Noted   Stage 3 chronic kidney disease (HCC) 04/16/2022   Fatigue 02/22/2022   Bilateral carotid artery disease (HCC) 02/22/2022  Acute pain of right foot 09/17/2021   Traumatic partial tear of right biceps tendon 06/30/2021   Rupture of pectoralis major muscle 06/30/2021   Greater trochanteric pain syndrome 01/24/2018   Hemiparesis affecting right side as late effect of cerebrovascular accident (HCC)  06/15/2017   Mixed hyperlipidemia 03/30/2017   Rotator cuff tear arthropathy of right shoulder 04/14/2016   Hemifacial spasm 02/12/2016   Abnormality of gait 02/12/2016   Increased prostate specific antigen (PSA) velocity 01/07/2016   Type 2 diabetes mellitus without complication (HCC) 12/18/2014   Full thickness rotator cuff tear, Left Shoulder 12/03/2014   Injury of left shoulder 08/22/2014   Facial nerve spasm 08/13/2014   Restlessness and agitation 08/13/2014   Allergic rhinitis 11/22/2012   Chest tightness 11/07/2012   Controlled type 2 diabetes mellitus with complication, without long-term current use of insulin (HCC) 01/21/2011   Pure hypercholesterolemia 01/21/2011   Essential hypertension 01/21/2011   Erectile dysfunction 01/21/2011   Past Medical History:  Diagnosis Date   Adenomatous colon polyp 07/2013   Asthma h/o as a child-s/p immunotherapy   Diabetes mellitus diet controlled   Facial nerve spasm 08/13/2014   Fatigue 02/22/2022   FHx: colon cancer    Hiatal hernia    Hypercholesteremia    Hypertension age 52   Internal hemorrhoids 07/2013   noted on colonoscopy   Restlessness and agitation 08/13/2014   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 09/18/2019   Dr.Whitfield   Family History  Problem Relation Age of Onset   Alcohol abuse Father    Cancer Sister        stomach/?pancreatic   Cancer Sister        lung and brain   Hypertension Sister    Colon cancer Brother 29   Cancer Brother        colon cancer in mid 63's   COPD Brother    Hypertension Brother    Diabetes Maternal Grandmother    Arthritis Daughter        psoriatic   Arthritis Daughter        psoriatic   Hyperlipidemia Son    Hyperlipidemia Son    Stroke Neg Hx    Heart disease Neg Hx    Past Surgical History:  Procedure Laterality Date   APPENDECTOMY     BLEPHAROPLASTY Bilateral 12/13/2016   CARDIOVASCULAR STRESS TEST  normal   CATARACT EXTRACTION, BILATERAL   07/2014, 08/2014   COLONOSCOPY  3/08, 07/2013   no repeat rec due to age per Dr. 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

## 2023-10-06 DIAGNOSIS — M25519 Pain in unspecified shoulder: Secondary | ICD-10-CM | POA: Diagnosis not present

## 2023-10-07 ENCOUNTER — Other Ambulatory Visit: Payer: Self-pay | Admitting: Family Medicine

## 2023-10-07 DIAGNOSIS — N529 Male erectile dysfunction, unspecified: Secondary | ICD-10-CM

## 2023-10-07 NOTE — Telephone Encounter (Signed)
 Is this okay to refill?

## 2023-10-12 ENCOUNTER — Encounter: Payer: Self-pay | Admitting: Sports Medicine

## 2023-10-12 ENCOUNTER — Ambulatory Visit: Payer: Medicare Other | Admitting: Sports Medicine

## 2023-10-12 DIAGNOSIS — M1711 Unilateral primary osteoarthritis, right knee: Secondary | ICD-10-CM

## 2023-10-12 MED ORDER — BUPIVACAINE HCL 0.25 % IJ SOLN
2.0000 mL | INTRAMUSCULAR | Status: AC | PRN
  Administered 2023-10-12: 2 mL via INTRA_ARTICULAR

## 2023-10-12 MED ORDER — LIDOCAINE HCL 1 % IJ SOLN
2.0000 mL | INTRAMUSCULAR | Status: AC | PRN
  Administered 2023-10-12: 2 mL

## 2023-10-12 MED ORDER — HYALURONAN 30 MG/2ML IX SOSY
30.0000 mg | PREFILLED_SYRINGE | INTRA_ARTICULAR | Status: AC | PRN
  Administered 2023-10-12: 30 mg via INTRA_ARTICULAR

## 2023-10-12 NOTE — Progress Notes (Signed)
   Procedure Note  Patient: Peter [name]             Date of Birth: 06-Mar-1930           MRN: [id number]             Visit Date: 10/12/2023  Peter [name] is here for Orthovisc injection #2 into the right knee. Has series of 3 to complete.  Procedures: Visit Diagnoses:  1. Unilateral primary osteoarthritis, right knee     Large Joint Inj: R knee on 10/12/2023 10:22 AM Details: 22 G 1.5 in needle, anterolateral approach Medications: 2 mL lidocaine 1 %; 2 mL bupivacaine 0.25 %; 30 mg Hyaluronan 30 MG/2ML Outcome: tolerated well, no immediate complications Procedure, treatment alternatives, risks and benefits explained, specific risks discussed. Consent was given by the patient. Patient was prepped and draped in the usual sterile fashion.     - He will f/u next week for his 3rd Orthovisc - post injection protocol discussed  Madelyn Brunner, DO Primary Care Sports Medicine Physician  [facility] - Orthopedics  This note was dictated using Dragon naturally speaking software and may contain errors in syntax, spelling, or content which have not been identified prior to signing this note.

## 2023-10-12 NOTE — Progress Notes (Signed)
 Patient is having some soreness since his first gel injection into the right knee. He says that he is having some more pain putting weight on the right leg since the first injection.

## 2023-10-13 ENCOUNTER — Other Ambulatory Visit (HOSPITAL_BASED_OUTPATIENT_CLINIC_OR_DEPARTMENT_OTHER): Payer: Self-pay | Admitting: Family

## 2023-10-13 DIAGNOSIS — I10 Essential (primary) hypertension: Secondary | ICD-10-CM

## 2023-10-20 ENCOUNTER — Ambulatory Visit: Admitting: Sports Medicine

## 2023-10-20 ENCOUNTER — Other Ambulatory Visit (HOSPITAL_BASED_OUTPATIENT_CLINIC_OR_DEPARTMENT_OTHER): Payer: Self-pay | Admitting: Cardiovascular Disease

## 2023-10-20 ENCOUNTER — Encounter: Payer: Self-pay | Admitting: Sports Medicine

## 2023-10-20 DIAGNOSIS — I10 Essential (primary) hypertension: Secondary | ICD-10-CM

## 2023-10-20 DIAGNOSIS — M25519 Pain in unspecified shoulder: Secondary | ICD-10-CM | POA: Diagnosis not present

## 2023-10-20 DIAGNOSIS — M1711 Unilateral primary osteoarthritis, right knee: Secondary | ICD-10-CM | POA: Diagnosis not present

## 2023-10-20 MED ORDER — LIDOCAINE HCL 1 % IJ SOLN
2.0000 mL | INTRAMUSCULAR | Status: AC | PRN
  Administered 2023-10-20: 2 mL

## 2023-10-20 MED ORDER — BUPIVACAINE HCL 0.25 % IJ SOLN
2.0000 mL | INTRAMUSCULAR | Status: AC | PRN
  Administered 2023-10-20: 2 mL via INTRA_ARTICULAR

## 2023-10-20 MED ORDER — HYALURONAN 30 MG/2ML IX SOSY
30.0000 mg | PREFILLED_SYRINGE | INTRA_ARTICULAR | Status: AC | PRN
  Administered 2023-10-20: 30 mg via INTRA_ARTICULAR

## 2023-10-20 NOTE — Progress Notes (Addendum)
      Procedure Note  Patient: Peter [name]             Date of Birth: 1930-06-30           MRN: [id number]             Visit Date: 10/20/2023  Gene is here for Orthovisc injection #3 of 3 for the right knee. Feeling better than previously.  Procedures: Visit Diagnoses:  1. Unilateral primary osteoarthritis, right knee    Large Joint Inj: R knee on 10/20/2023 8:21 AM Details: 22 G 1.5 in needle, anterolateral approach Medications: 2 mL lidocaine 1 %; 2 mL bupivacaine 0.25 %; 30 mg Hyaluronan 30 MG/2ML Outcome: tolerated well, no immediate complications Procedure, treatment alternatives, risks and benefits explained, specific risks discussed. Consent was given by the patient. Patient was prepped and draped in the usual sterile fashion.     - post injection protocol discussed   Madelyn Brunner, DO Primary Care Sports Medicine Physician  [facility] - Orthopedics

## 2023-10-20 NOTE — Progress Notes (Signed)
 Patient is here for third Orthovisc injection. He says he maybe feels a little better today, and not as sore as last time.

## 2023-10-24 DIAGNOSIS — K080 Exfoliation of teeth due to systemic causes: Secondary | ICD-10-CM | POA: Diagnosis not present

## 2023-10-27 DIAGNOSIS — M25519 Pain in unspecified shoulder: Secondary | ICD-10-CM | POA: Diagnosis not present

## 2023-11-01 ENCOUNTER — Encounter: Payer: Self-pay | Admitting: Sports Medicine

## 2023-11-01 ENCOUNTER — Ambulatory Visit: Payer: Medicare Other | Admitting: Sports Medicine

## 2023-11-01 VITALS — BP 163/54 | HR 84

## 2023-11-01 DIAGNOSIS — G5793 Unspecified mononeuropathy of bilateral lower limbs: Secondary | ICD-10-CM

## 2023-11-01 DIAGNOSIS — E1142 Type 2 diabetes mellitus with diabetic polyneuropathy: Secondary | ICD-10-CM | POA: Diagnosis not present

## 2023-11-01 DIAGNOSIS — I10 Essential (primary) hypertension: Secondary | ICD-10-CM

## 2023-11-01 NOTE — Progress Notes (Signed)
 Peter [name] - 89 y.o. male MRN [id number]  Date of birth: 25-Jun-1930  Office Visit Note: Visit Date: 11/01/2023 PCP: Joselyn Arrow, MD Referred by: Joselyn Arrow, MD  Subjective: Chief Complaint  Patient presents with   Peter [name]   HPI: Peter [name] is a pleasant 89 y.o. male who presents today for bilateral lower extremity diabetic neuropathy.   He is a type II diabetic - he is currently diet-controlled.  He has had chronic diabetic neuropathy for years. He is managed on gabapentin 300 mg nightly with some relief.  Wears supportive insoles help support the feet, but still has persisting symptoms.  He did notice some improvement after the first Qutenza application and then more improvement after the second Qutenza treatment, he is here for his third application today.   Pertinent ROS were reviewed with the patient and found to be negative unless otherwise specified above in HPI.   Assessment & Plan: Visit Diagnoses:  1. Type 2 diabetes mellitus with diabetic polyneuropathy, without long-term current use of insulin (HCC)   2. Neuropathy involving both lower extremities   3. Essential hypertension    Plan: Peter [name] has been finding benefit from Qutenza treatments with partial relief of his neuropathic pain.  We discussed pharmacologic and conservative treatment measures for his lower extremity diabetic neuropathy, we did proceed with Qutenza treatment on both the dorsal and plantar aspects of each foot today, see procedure note below. Discussed Qutenza application procedure today is used to treat the distal peripheral nerve(s) of each foot with chemical denervation, and is an option for neuropathic pain control. Discussed that this is a capsaicin patch, stronger than capsaicin cream. Discussed that it is currently approved for diabetic peripheral neuropathy and post-herpetic neuralgia, but that it has also shown benefit in treating other forms of neuropathy. Discussed that the patch  would be placed in office and benefits usually last 3 months. Discussed that unintended exposure to capsaicin can cause severe irritation of eyes, mucous membranes, respiratory tract, and skin, but that Qutenza is a local treatment and does not have the systemic side effects of other nerve medications. Discussed that there may be pain, itching, erythema, and decreased sensory function associated with the application of Qutenza. Side effects usually subside within 1 week. A cold pack of analgesic medications can help with these side effects if needed, but was not required during treatment today. He does have gel ice packs that he uses when he gets home following treatment.  Given the improvement, we will plan on repeating this for his fourth session in 3 months.  He will continue in his shoe inserts for support as well as his gabapentin 300 mg nightly.  His blood pressure was mildly elevated today, he states he usually is in the upper 130s-mid 140s systolically.  He was asymptomatic today and states slightly anxious given the Qutenza treatment today.  He will continue routine follow-up with his PCP regarding his hypertension.  Follow-up: Return in about 3 months (around 02/02/2024) for for repeat Qutenza treatment (30-min appt).   Meds & Orders: No orders of the defined types were placed in this encounter.  No orders of the defined types were placed in this encounter.    Procedures: - 1 patch of Qutenza was applied to the area of foot pain, which included the dorsal and plantar aspect of each foot for a total of 4 Qutenza patches used to treat the distal peripheral nerve(s) of each foot with chemical denervation using Capsaicin 8% (Qutenza).  Blood pressure was monitored every 15 minutes without abnormality. The patient tolerated the procedure well. Post-procedure instructions were given. Qutenza representative was present during visit to answer questions.        Clinical History: No specialty comments  available.  He reports that he quit smoking about 49 years ago. His smoking use included cigarettes. He has never used smokeless tobacco.  Recent Labs    12/06/22 1020  HGBA1C 6.8*    Objective:   Vital Signs: BP (!) 160/68 (BP Location: Right Arm, Patient Position: Sitting)   Pulse 84   Physical Exam  Gen: Well-appearing, in no acute distress; non-toxic CV: Well-perfused. Warm.  Resp: Breathing unlabored on room air; no wheezing. Psych: Fluid speech in conversation; appropriate affect; normal thought process  Ortho Exam - Bilateral feet: There is mild to moderate pes planus.  There is appropriate sensation to light touch on the dorsal and plantar aspects of the foot.  Cap refill less than 2 seconds. Hammertoe deformity of right 2/3 toe.  Imaging: No results found.  Past Medical/Family/Surgical/Social History: Medications & Allergies reviewed per EMR, new medications updated. Patient Active Problem List   Diagnosis Date Noted   Stage 3 chronic kidney disease (HCC) 04/16/2022   Fatigue 02/22/2022   Bilateral carotid artery disease (HCC) 02/22/2022   Acute pain of right foot 09/17/2021   Traumatic partial tear of right biceps tendon 06/30/2021   Rupture of pectoralis major muscle 06/30/2021   Greater trochanteric pain syndrome 01/24/2018   Hemiparesis affecting right side as late effect of cerebrovascular accident (HCC) 06/15/2017   Mixed hyperlipidemia 03/30/2017   Rotator cuff tear arthropathy of right shoulder 04/14/2016   Hemifacial spasm 02/12/2016   Abnormality of gait 02/12/2016   Increased prostate specific antigen (PSA) velocity 01/07/2016   Type 2 diabetes mellitus without complication (HCC) 12/18/2014   Full thickness rotator cuff tear, Left Shoulder 12/03/2014   Injury of left shoulder 08/22/2014   Facial nerve spasm 08/13/2014   Restlessness and agitation 08/13/2014   Allergic rhinitis 11/22/2012   Chest tightness 11/07/2012   Controlled type 2 diabetes  mellitus with complication, without long-term current use of insulin (HCC) 01/21/2011   Pure hypercholesterolemia 01/21/2011   Essential hypertension 01/21/2011   Erectile dysfunction 01/21/2011   Past Medical History:  Diagnosis Date   Adenomatous colon polyp 07/2013   Asthma h/o as a child-s/p immunotherapy   Diabetes mellitus diet controlled   Facial nerve spasm 08/13/2014   Fatigue 02/22/2022   FHx: colon cancer    Hiatal hernia    Hypercholesteremia    Hypertension age 20   Internal hemorrhoids 07/2013   noted on colonoscopy   Restlessness and agitation 08/13/2014   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 09/18/2019   Dr.Whitfield   Family History  Problem Relation Age of Onset   Alcohol abuse Father    Cancer Sister        stomach/?pancreatic   Cancer Sister        lung and brain   Hypertension Sister    Colon cancer Brother 34   Cancer Brother        colon cancer in mid 107's   COPD Brother    Hypertension Brother    Diabetes Maternal Grandmother    Arthritis Daughter        psoriatic   Arthritis Daughter        psoriatic   Hyperlipidemia Son    Hyperlipidemia Son    Stroke Neg  Hx    Heart disease Neg Hx    Past Surgical History:  Procedure Laterality Date   APPENDECTOMY     BLEPHAROPLASTY Bilateral 12/13/2016   CARDIOVASCULAR STRESS TEST  normal   CATARACT EXTRACTION, BILATERAL  07/2014, 08/2014   COLONOSCOPY  3/08, 07/2013   no repeat rec due to age per Dr. 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

## 2023-11-02 ENCOUNTER — Ambulatory Visit: Admitting: Neurology

## 2023-11-02 VITALS — BP 133/63

## 2023-11-02 DIAGNOSIS — G5131 Clonic hemifacial spasm, right: Secondary | ICD-10-CM | POA: Diagnosis not present

## 2023-11-02 MED ORDER — INCOBOTULINUMTOXINA 50 UNITS IM SOLR
50.0000 [IU] | INTRAMUSCULAR | Status: AC
  Administered 2023-11-02: 50 [IU] via INTRAMUSCULAR

## 2023-11-02 NOTE — Progress Notes (Signed)
 xeomin 50units x 1 vial  [id number] [phone] [id number] B/B  Bacteriostatic 0.9% Sodium Chloride- 1mL  Lot:[id number] Expiration: 06/09/24 NDC: 0160109323 Dx:  G51.31    WITNESSED FT:[id number] cma

## 2023-11-02 NOTE — Progress Notes (Signed)
    PATIENT: Peter [name] DOB: 09-Nov-1929  Chief Complaint  Patient presents with   Injections    Rm14, pt is well, alone, and ready for injection     HISTORICAL  Peter [name] is 89 years old right-handed male, accompanied by his wife, seen in refer by Dr. Vickey Huger for evaluation of EMG guided botulism toxin injection for right hemifacial spasm  I reviewed and summarized his medical history, he had a history of hypertension hyperlipidemia, bilateral cataract surgery, around 2015, he noticed right facial muscle spasm, initially mainly involving muscle around his right eye, over the past few years, gradually getting worse, more frequent, occasionally blocking his right vision, also spreading to right cheek, right upper mouth corner, is present 25% daytime, he also has bilateral senior ptosis, occasionally blocking upper vision, was suggested bilateral blepharoplasty by his ophthalmologist.   He denies a history of right facial palsy, no visual loss, no hearing loss,  He complains of chronic cough for few months, He also complains of chronic low back pain, knee pain, mild gait abnormality.  Update April 01 2016:  We have personally reviewed MRI of brain in July 2017, there is evidence of generalized atrophy, supratentorium small vessel disease, no acute abnormalities.  We reviewed that he has vascular risk factor of aging, hypertension, hyperlipidemia, he should take baby aspirin daily.  Today is his first EMG guided Xeomin injection for right hemifacial spasm, all the questions were answered, potential side effect was explained  UPDATE Jul 07 2016: He responded very well to previous EMG guided Xeomin injection in August 2017 for right hemifacial spasm, the same time he was evaluated by ophthalmologist, is planning on to have right blepharoplasty for redundant tissue at the right upper eyelid.  UPDATE March 09 2017: He had bilateral blepharoplasty, also tear duct  procedure, only recent few weeks, he noticed recurrent right eye, and cheek muscle twitching, last Xeomin injection was on July 07 7016.  UPDATE Jun 15 2017: He did very well with previous injection, there is no significant side effect noticed.  UPDATE Jun 19 2018: Last injection was in 2018, he did very well.  UPDATE Oct 03 2019: He responded very well to previous injection  UPDATE March 03 2021: He return for EMG guided Xeomin injection for right hemifacial spasm, previous injection works well for him  UPDATE Jun 03 2021: He did well with previous injection, return for EMG guided injection,  UPDATE November 10 2021: responded well to previous injection  Update February 11, 2022: He responded well to previous injection  UPDATE February 23 2023: He did well with previous injection, only recently began to have recovery  Activity especially while smiling   PHYSICAL EXAMNIATION:   CN VII: He has occasional right hemifacial muscle spasm, mainly involving right orbicularis oculi, occasional involvement of right cheek, upper mouth area and right frontalis muscle   DIAGNOSTIC DATA (LABS, IMAGING, TESTING) - I reviewed patient records, labs, notes, testing and imaging myself where available.   ASSESSMENT AND PLAN  Peter [name] is a 89 y.o. male   Right hemifacial spasm  EMG guided xeomin injection, 50 units/1 cc normal saline 2.5 units each x18=45 units, discard 5 units         Levert Feinstein, M.D. Ph.D.  [facility] [address], [address] [place], [place] [address] Ph: [phone] Fax: [phone]  CC: Joselyn Arrow, MD  "?

## 2023-11-17 DIAGNOSIS — M25519 Pain in unspecified shoulder: Secondary | ICD-10-CM | POA: Diagnosis not present

## 2023-11-22 ENCOUNTER — Other Ambulatory Visit: Payer: Self-pay

## 2023-12-01 ENCOUNTER — Other Ambulatory Visit: Payer: Self-pay

## 2023-12-01 DIAGNOSIS — M25519 Pain in unspecified shoulder: Secondary | ICD-10-CM | POA: Diagnosis not present

## 2023-12-08 DIAGNOSIS — M25519 Pain in unspecified shoulder: Secondary | ICD-10-CM | POA: Diagnosis not present

## 2023-12-12 DIAGNOSIS — Z85828 Personal history of other malignant neoplasm of skin: Secondary | ICD-10-CM | POA: Diagnosis not present

## 2023-12-12 DIAGNOSIS — L814 Other melanin hyperpigmentation: Secondary | ICD-10-CM | POA: Diagnosis not present

## 2023-12-12 DIAGNOSIS — L821 Other seborrheic keratosis: Secondary | ICD-10-CM | POA: Diagnosis not present

## 2023-12-12 DIAGNOSIS — L570 Actinic keratosis: Secondary | ICD-10-CM | POA: Diagnosis not present

## 2023-12-12 NOTE — Patient Instructions (Incomplete)
  Peter [name] , Thank you for taking time to come for your Medicare Wellness Visit. I appreciate your ongoing commitment to your health goals. Please review the following plan we discussed and let me know if I can assist you in the future.   This is a list of the screening recommended for you and due dates:  Health Maintenance  Topic Date Due   Eye exam for diabetics  02/11/2023   COVID-19 Vaccine (6 - 2024-25 season) 04/10/2023   Complete foot exam   04/15/2023   Hemoglobin A1C  06/07/2023   Flu Shot  03/09/2024   Medicare Annual Wellness Visit  12/13/2024   DTaP/Tdap/Td vaccine (3 - Td or Tdap) 09/08/2032   Pneumonia Vaccine  Completed   Zoster (Shingles) Vaccine  Completed   HPV Vaccine  Aged Out   Meningitis B Vaccine  Aged Out   You are eligible for another COVID booster in June (every 6 months); if you don't want that, I do recommend getting the updated one in the Fall (updated with variant coverage).  You are past due for a diabetic eye exam.  Please get this scheduled, and ask the doctor to send us  a copy of the report.  It is important that you let them know that you have diabetes, or else they will not know to do a diabetic eye exam (since you don't take medications for diabetes).  Consider taking Mucinex 12 hour at bedtime to loosen up the drainage that gets you up at night. Be sure that you are taking zyrtec daily, in addition to the Flonase . Be sure to use just gentle sniffs with the Flonase . (Look at your coricidin that you are taking occasionally--if it doesn't contain guaifenesin as an ingredient, you can use PLAIN mucinex along with it (not mucinex DM)). Or try having some water, or a sugar free lozenge.  Please contact Dr. Letta Raw office as you are due this month for your yearly visit. Your blood pressure in the office was great, but is running higher than goal at home.  Please try and limit the sodium in your diet (see handout). It sounds as though you eat a lot of  processed meats (ham, sausage, bacon, hot dogs). Try and eat more chicken and fish (grilled, not fried).    You can try the full tablet of the Viagra  if you find that the 1/2 tablet isn't effective enough. We briefly discussed Cialis  (which is a similar medication).  Some folks benefit from the low dose taken daily, which can also help with enlarged prostate symptoms.  Consider doing Silver Sne[employer] classes with your wife, or using the exercise bike more frequently.

## 2023-12-12 NOTE — Progress Notes (Unsigned)
 No chief complaint on file.   Peter [name] is a 89 y.o. male who presents for a Medicare Annual Wellness visit and follow up on chronic problems. He hasn't been seen in a year (didn't have CPE in the Fall).  Diabetes--this has always been diet controlled, never took meds.  He doesn't monitor blood sugars. He really never watched his diet too carefully. At one point when he saw that he was "obese" on a weight chart he started watching his diet more carefully, but only for a short while.  His A1c was up to 7% at his physical in September 2023, was 6.8% on last check a year ago.  He declined medications.  Last year he reported drinking 2 beers every day or every other day and snacking on pretzels, cheese, along with the beer. He also reported eating candy. Current diet ***  Denies polydipsia or polyuria. No retinopathy on diabetic eye exam done 02/2022. ***UPDATE He checks his feet regularly.  He was started on gabapentin  for diabetic neuropathy by Dr. Vaughn Georges in 10/2023.  He takes 300 mg at bedtime, which helps some. He has also been getting Qutenza  applications (capsaicin  patch)--he has had 3 applications, last of which was 11/01/23. These are every 3 months, and provides some benefit.   Component Ref Range & Units (hover) 1 yr ago (12/06/22) 1 yr ago (04/13/22) 2 yr ago (07/27/21) 2 yr ago (04/27/21) 3 yr ago (03/24/20) 4 yr ago (09/06/19) 4 yr ago (02/13/19)  Hemoglobin A1C 6.8 Abnormal  7.0 High  R, CM 6.6 Abnormal  6.8 Abnormal  6.4 Abnormal  6.4 Abnormal  6.2 High  R, CM    CKD:  Cr had bumped up to 1.68 in 04/2022.  He discontinued Celebrex , and Cr went back to baseline of 1.3 range.  Last Cr 1.42 a year ago. Due for recheck.   Hypertension, hyperlipidemia, elevated coronary calcium  score and mild carotid artery disease.  He has been under the care of Dr. Alroy Aspen, last seen in 12/2022. He has no cardiology f/u scheduled. He had negative stress test 02/2022 (done for constricting feeling  in chest when walking up hill of driveway, which has since resolved). He has longstanding chest discomfort periodically, related to hiatal hernia (and relieved by Tums), after spicy foods, alcohol, tomatoes. This improved after he stopped drinking gin. His BP's had remained elevated; Dr. Theodis Fiscal switched to extended release carvedilol , but still was above goal, and poss worsened fatigue.  Carvedilol  was stopped, and switched to amlodipine . He currently takes Losartan  100 mg and amlodipine  10 mg.  His pulses run higher since stopping carvedilol , but denies tachycardia, palpitations. Pulse is in the 80's (used to be in the 60's), was high at last cardiology visit.  If he stands up quickly he feels dizzy (for many years, intermittent, and also related to his balance, vision).  BP's are running ***   BP Readings from Last 3 Encounters:  11/02/23 133/63  11/01/23 (!) 163/54  06/15/23 130/62   Mild carotid artery disease. Carotid US  03/2022 with 1-39% stenosis bilaterally.  Dopplers were rechecked in 03/2023 and unchanged. He continues to take aspirin and atorvastatin . He denies any neurologic symptoms, other than mild balance problems, which have remained stable.   Hyperlipidemia: He is compliant with 20mg  of atorvastatin  daily. He eats red meat (beef, pork) 2-3 times/week. Snacks on cheese frequently. Past due for recheck.  Lab Results  Component Value Date   CHOL 152 04/13/2022   HDL 54 04/13/2022  LDLCALC 82 04/13/2022   TRIG 83 04/13/2022   CHOLHDL 2.8 04/13/2022    Subclinical hypothyroidism.  He has some chronic mild fatigue. Denies changes to hair/skin/nails/moods/bowels or temperature intolerance.  Component Ref Range & Units (hover) 1 yr ago 2 yr ago 3 yr ago 4 yr ago 5 yr ago 6 yr ago 7 yr ago  TSH 4.510 High  5.070 High  7.160 High  5.060 High  4.360 6.38 High  R 3.76 R    COPD/asthma and uses Anoro daily (just ran out, plans to pick up from the pharmacy today). Since taking  the Anoro more regularly, he hasn't needed any albuterol  in a long time.   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     Allergies are controlled with flonase  and zyrtec daily.    R hemifacial spasm, controlled with Xeomin  injections. Last injection was 10/2023, prior was in 02/2023.  Chronic R shoulder pain--known torn RC tendons, mild OA.  He does well with periodic injections, last was 07/2023 and reports doing well right now.  He no longer takes Celebrex  (due to CKD, CAD).  Right knee pain--s/p another series of 3 orthovisc injections in Feb/March 2025 with Dr. Vaughn Georges.   Low back pain with radiculopathy and anteriolisthesis (L4 on L5)--planning for transforaminal epidural steroid injection (waiting for insurance approval).   He is hesitant to get an injection, but the burning on the bottom of his feet really affects his balance and walking, so he may give it a try.  That discomfort is worse than the burning discomfort in the lower back and buttocks. He tried gabapentin  (100mg ), reportedly stopped after a week due to being ineffective.  He just restarted this 6 days ago, at 100mg .  He takes this at night and denies side effects.  He plans to work on titrating the dose up (to increase to 200mg  soon).    Immunization History  Administered Date(s) Administered   Fluad Quad(high Dose 65+) 05/29/2019, 04/09/2020, 04/27/2021, 05/07/2022   Influenza Split 05/06/2011, 05/25/2012   Influenza, High Dose Seasonal PF 05/21/2013, 05/22/2014, 06/25/2015, 04/15/2016, 05/04/2017, 07/19/2018   Moderna Covid-19 Vaccine Bivalent Booster 66yrs & up 09/07/2021   Moderna SARS-COV2 Booster Vaccination 06/19/2020   Moderna Sars-Covid-2 Vaccination 08/23/2019, 09/20/2019   PNEUMOCOCCAL CONJUGATE-20 12/06/2022   Pfizer(Comirnaty )Fall Seasonal Vaccine 12 years and older 10/29/2022   Pneumococcal Conjugate-13 11/28/2013   Pneumococcal Polysaccharide-23 08/09/2006,  04/15/2016   Respiratory Syncytial Virus Vaccine ,Recomb Aduvanted(Arexvy ) 06/28/2022   Tdap 05/06/2011, 09/08/2022   Zoster Recombinant(Shingrix) 11/21/2019, 03/11/2020   Zoster, Live 06/01/2012   Last colonoscopy: 07/2013, Dr. Denece Finger, tubular adenoma Last PSA:  2.4 in 12/2016 Dentist: twice yearly   Ophtho: regularly Sees dermatologist twice yearly Exercise: walking the dog, 15-20 minutes 2/day. Goes to the gym for "body structure manipulation" once a week. Sometimes he will get there early and use the bike for 10 minutes. Does some yardwork.   Patient Care Team: Roosvelt Colla, MD as PCP - General (Family Medicine) Maudine Sos, MD as PCP - Cardiology (Cardiology) Glorine Laroche, MD as Consulting Physician (Sports Medicine) Prescott Brodie, MD (Inactive) as Consulting Physician (Otolaryngology) Dohmeier, Raoul Byes, MD as Consulting Physician (Neurology) Phebe Brasil, MD as Consulting Physician (Neurology) Rudine Cos, MD as Consulting Physician (Ophthalmology) Gaynelle Keeling, MD as Consulting Physician (Dermatology) Jolinda Necessary, MD (Inactive) as Consulting Physician (Gastroenterology) Maudine Sos, MD as Attending Physician (Cardiology) [place] for hearing loss  Dr. Shauna Del (ortho)   Depression Screening: Flowsheet Row  Office Visit from 12/06/2022 in [facility]  PHQ-2 Total Score 0        Falls screen:     12/06/2022   10:00 AM 04/14/2022    9:56 AM 07/27/2021    2:40 PM 04/27/2021    1:43 PM 03/24/2020    3:03 PM  Fall Risk   Falls in the past year? 0 1 0 0 0  Number falls in past yr: 0 0 0 0   Comment  3 weeks ago tripped when he got out of bed     Injury with Fall? 0 0 0 0   Risk for fall due to : No Fall Risks No Fall Risks No Fall Risks No Fall Risks   Follow up Falls evaluation completed Falls evaluation completed Falls evaluation completed Falls evaluation completed      Functional Status Survey:        He has a living  will and healthcare power of attorney, in chart.     PMH, PSH, SH and FH were reviewed and updated     ROS: The patient denies anorexia, fever, headaches, ear pain, exertional chest pain, palpitations, syncope, dyspnea on exertion, swelling, nausea, vomiting, diarrhea, constipation, abdominal pain, melena, hematochezia, hematuria, incontinence, weakened urine stream, dysuria,  numbness, tingling, weakness, tremor, suspicious skin lesions, depression, anxiety, abnormal bleeding/bruising, or enlarged lymph nodes. No insomnia +stable hearing loss.   Some dizziness when he first stands up, unchanged, and some chronic balance issues. Nocturia x 3, unchanged. Stream is slow.  Feels like he can empty well. Rare indigestion, r/b Tums, only after triggering foods.  Sees dermatologist regularly (twice yearly) Hemifacial spasm improved with botox injections Fatigue--chronic, unchanged Breathing is good. Shoulder, knee and back pain per HPI. Neuropathy per HPI.    PHYSICAL EXAM:   There were no vitals taken for this visit.   Wt Readings from Last 3 Encounters:  02/23/23 187 lb (84.8 kg)  01/06/23 190 lb 9.6 oz (86.5 kg)  12/06/22 192 lb 9.6 oz (87.4 kg)   Well-appearing, pleasant, elderly male in no distress. He is in good spirits HEENT: conjunctiva and sclera are clear, EOMI.  Some twitching of R eye. *** Neck: no lymphadenopathy or mass Heart: regular rate and rhythm, 2/6 SEM at RUSB. Lungs: clear bilaterally Abdomen: soft, nontender, no organomegaly or mass Back: no spinal or CVA tenderness Neuro: alert and oriented. Extremities: no edema Psych: normal mood, full range of affect, normal eye contact, speech and grooming    Lab Results  Component Value Date   HGBA1C 6.8 (A) 12/06/2022     ASSESSMENT/PLAN:   Any flu shot or COVID booster to add from Fall 2024?  A1c cbc, c-met, TSH, free T4, urine microalb, lipids   Has he had diabetic eye exam since 2023? If so, please get  notes Last saw Dr. Alroy Aspen 12/2022 with nothing scheduled. Due for yearly visit.  Taking anoro? Last rx'd x 6 mos in july  Last year-- Discussed risks of aspirin, and discussed GI protecting meds d/t aspirin use (pepcid or prilosec) Diet reviewed in detail--encouraged cutting back on sweets, alcohol, pretzels (carbs and salt), cheese (weight, lipids).   PSA not recommended based on age, recommended at least 30 minutes of aerobic activity at least 5 days/week, weight-bearing exercise at least 2x/week; proper sunscreen use reviewed; healthy diet and alcohol recommendations (less than or equal to 2 drinks/day) reviewed; regular seatbelt use; changing batteries in smoke detectors. Immunization recommendations discussed--continue yearly high dose flu shots.  COVID boosters discussed *** Colonoscopy recommendations reviewed; was due in 2019, but due to age and lack of sx, had been doing FIT testing.   Needs to schedule CPE--depends on A1c (3 mos vs 6 mos)    Medicare Attestation I have personally reviewed: The patient's medical and social history Their use of alcohol, tobacco or illicit drugs Their current medications and supplements The patient's functional ability including ADLs,fall risks, home safety risks, cognitive, and hearing and visual impairment Diet and physical activities Evidence for depression or mood disorders   The patient's weight, height and BMI have been recorded in the chart.  I have made referrals, counseling, and provided education to the patient based on review of the above and I have provided the patient with a written personalized care plan for preventive services.

## 2023-12-13 ENCOUNTER — Encounter: Payer: Self-pay | Admitting: Sports Medicine

## 2023-12-13 ENCOUNTER — Ambulatory Visit: Admitting: Sports Medicine

## 2023-12-13 DIAGNOSIS — M25561 Pain in right knee: Secondary | ICD-10-CM | POA: Diagnosis not present

## 2023-12-13 DIAGNOSIS — E1142 Type 2 diabetes mellitus with diabetic polyneuropathy: Secondary | ICD-10-CM

## 2023-12-13 DIAGNOSIS — M170 Bilateral primary osteoarthritis of knee: Secondary | ICD-10-CM

## 2023-12-13 DIAGNOSIS — G5793 Unspecified mononeuropathy of bilateral lower limbs: Secondary | ICD-10-CM

## 2023-12-13 DIAGNOSIS — M25562 Pain in left knee: Secondary | ICD-10-CM

## 2023-12-13 DIAGNOSIS — G8929 Other chronic pain: Secondary | ICD-10-CM

## 2023-12-13 MED ORDER — BUPIVACAINE HCL 0.25 % IJ SOLN
2.0000 mL | INTRAMUSCULAR | Status: AC | PRN
  Administered 2023-12-13: 2 mL via INTRA_ARTICULAR

## 2023-12-13 MED ORDER — METHYLPREDNISOLONE ACETATE 40 MG/ML IJ SUSP
40.0000 mg | INTRAMUSCULAR | Status: AC | PRN
  Administered 2023-12-13: 40 mg via INTRA_ARTICULAR

## 2023-12-13 MED ORDER — LIDOCAINE HCL 1 % IJ SOLN
2.0000 mL | INTRAMUSCULAR | Status: AC | PRN
  Administered 2023-12-13: 2 mL

## 2023-12-13 NOTE — Progress Notes (Signed)
 Peter [name] - 89 y.o. male MRN [id number]  Date of birth: 1930/06/28  Office Visit Note: Visit Date: 12/13/2023 PCP: Roosvelt Colla, MD Referred by: Roosvelt Colla, MD  Subjective: Chief Complaint  Patient presents with   Right Knee - Pain   Left Knee - Pain   HPI: Peter [name] is a pleasant 89 y.o. male who presents today for bilateral knee pain with OA, R > L.  Gene is still experiencing right knee pain.  He has completed his 3 series of Orthovisc for the right knee and does not feel like this may much improvement for him.  He does have tricompartmental arthritic change of both knees but the right is worse than the left.  He does have a wedding upcoming in [place]  at the end of the month and would like to be feeling well for this as possible.  He is not taking any medications specifically for the knees.  He has responded well to corticosteroid injections in the past.  He continues taking gabapentin  300 mg nightly and has made good improvements with his lower extremity neuropathy with Qutenza  treatments in the past.  Lab Results  Component Value Date   HGBA1C 6.8 (A) 12/06/2022   Pertinent ROS were reviewed with the patient and found to be negative unless otherwise specified above in HPI.   Assessment & Plan: Visit Diagnoses:  1. Bilateral primary osteoarthritis of knee   2. Bilateral chronic knee pain   3. Type 2 diabetes mellitus with diabetic polyneuropathy, without long-term current use of insulin (HCC)   4. Neuropathy involving both lower extremities    Plan: Impression is bilateral knee osteoarthritis with right knee severe OA in the lateral > medial compartment.  He is dealing with an exacerbation of both of his knee pain.  Through shared decision making, we did proceed with both right and left knee corticosteroid injection, patient tolerated well.  Advised on postinjection protocol.  May use ice/heat or Tylenol as needed for postinjection pain. We did fit him for a  lateral unloader knee brace in the past, but he does not like wearing this as much. For his lower extremity diabetic neuropathy, he will continue gabapentin  300 mg nightly.  We will set him up for his additional (4th total) treatment of Qutenza  at the end of June as he has found marked relief in his symptoms with both of these modalities combined.  Follow-up: Return in about 7 weeks (around 02/01/2024) for Qutenza  treatment for LE neuropathy  Meds & Orders: No orders of the defined types were placed in this encounter.   Orders Placed This Encounter  Procedures   Large Joint Inj   Large Joint Inj     Procedures: Large Joint Inj: R knee on 12/13/2023 9:02 AM Indications: pain Details: 22 G 1.5 in needle, anterolateral approach Medications: 2 mL lidocaine  1 %; 2 mL bupivacaine  0.25 %; 40 mg methylPREDNISolone  acetate 40 MG/ML Outcome: tolerated well, no immediate complications  Knee Injection, Right: After discussion on risks/benefits/indications, informed verbal consent was obtained and a timeout was performed, patient was seated on exam table. The patient's knee was prepped with Betadine and alcohol swab and utilizing anterolateral approach, the patient's knee was injected intraarticularly with 2:2:1 lidocaine  1%:bupivicaine 0.25%:depomedrol. Patient tolerated the procedure well without immediate complications.  Procedure, treatment alternatives, risks and benefits explained, specific risks discussed. Consent was given by the patient. Patient was prepped and draped in the usual sterile fashion.    Large Joint Inj: L  knee on 12/13/2023 9:03 AM Indications: pain Details: 22 G 1.5 in needle, anterolateral approach Medications: 2 mL lidocaine  1 %; 2 mL bupivacaine  0.25 %; 40 mg methylPREDNISolone  acetate 40 MG/ML Outcome: tolerated well, no immediate complications  Knee Injection, Left: After discussion on risks/benefits/indications, informed verbal consent was obtained and a timeout was  performed, patient was seated on exam table. The patient's knee was prepped with Betadine and alcohol swab and utilizing anterolateral approach, the patient's knee was injected intraarticularly with 2:2:1 lidocaine  1%:bupivicaine 0.25%:depomedrol. Patient tolerated the procedure well without immediate complications.  Procedure, treatment alternatives, risks and benefits explained, specific risks discussed. Consent was given by the patient. Patient was prepped and draped in the usual sterile fashion.          Clinical History: No specialty comments available.  He reports that he quit smoking about 49 years ago. His smoking use included cigarettes. He has never used smokeless tobacco. No results for input(s): "HGBA1C", "LABURIC" in the last 8760 hours.  Objective:    Physical Exam  Gen: Well-appearing, in no acute distress; non-toxic CV: Well-perfused. Warm.  Resp: Breathing unlabored on room air; no wheezing. Psych: Fluid speech in conversation; appropriate affect; normal thought process  Ortho Exam - Bilateral knee: + TTP over the medial and lateral joint line of the right knee, less tender over the left knee.  The right knee has a trace to small effusion present, left knee without effusion.  Range of motion 0-125 degrees of the right knee, 0-130 degrees at the left knee.  There is a degree of pseudo instability of the right knee with a valgus without giveway.  Imaging:  10/15/22: 4 views of the right knee including AP, Rosenberg, lateral and sunrise  views were ordered and reviewed by myself.  X-rays demonstrate mild to  moderate tricompartmental osteoarthritic change.  Claryce Cruel view does show  collapse of the lateral joint line with near bone-on-bone arthritic change  of the lateral side.  There is some tibial spurring off the lateral  aspect.  No acute bony fracture noted.   Past Medical/Family/Surgical/Social History: Medications & Allergies reviewed per EMR, new medications  updated. Patient Active Problem List   Diagnosis Date Noted   Stage 3 chronic kidney disease (HCC) 04/16/2022   Fatigue 02/22/2022   Bilateral carotid artery disease (HCC) 02/22/2022   Acute pain of right foot 09/17/2021   Traumatic partial tear of right biceps tendon 06/30/2021   Rupture of pectoralis major muscle 06/30/2021   Greater trochanteric pain syndrome 01/24/2018   Hemiparesis affecting right side as late effect of cerebrovascular accident (HCC) 06/15/2017   Mixed hyperlipidemia 03/30/2017   Rotator cuff tear arthropathy of right shoulder 04/14/2016   Hemifacial spasm 02/12/2016   Abnormality of gait 02/12/2016   Increased prostate specific antigen (PSA) velocity 01/07/2016   Type 2 diabetes mellitus without complication (HCC) 12/18/2014   Full thickness rotator cuff tear, Left Shoulder 12/03/2014   Injury of left shoulder 08/22/2014   Facial nerve spasm 08/13/2014   Restlessness and agitation 08/13/2014   Allergic rhinitis 11/22/2012   Chest tightness 11/07/2012   Controlled type 2 diabetes mellitus with complication, without long-term current use of insulin (HCC) 01/21/2011   Pure hypercholesterolemia 01/21/2011   Essential hypertension 01/21/2011   Erectile dysfunction 01/21/2011   Past Medical History:  Diagnosis Date   Adenomatous colon polyp 07/2013   Asthma h/o as a child-s/p immunotherapy   Diabetes mellitus diet controlled   Facial nerve spasm 08/13/2014   Fatigue 02/22/2022  FHx: colon cancer    Hiatal hernia    Hypercholesteremia    Hypertension age 89   Internal hemorrhoids 07/2013   noted on colonoscopy   Restlessness and agitation 08/13/2014   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 09/18/2019   Dr.Whitfield   Family History  Problem Relation Age of Onset   Alcohol abuse Father    Cancer Sister        stomach/?pancreatic   Cancer Sister        lung and brain   Hypertension Sister    Colon cancer Brother 60   Cancer  Brother        colon cancer in mid 60's   COPD Brother    Hypertension Brother    Diabetes Maternal Grandmother    Arthritis Daughter        psoriatic   Arthritis Daughter        psoriatic   Hyperlipidemia Son    Hyperlipidemia Son    Stroke Neg Hx    Heart disease Neg Hx    Past Surgical History:  Procedure Laterality Date   APPENDECTOMY     BLEPHAROPLASTY Bilateral 12/13/2016   CARDIOVASCULAR STRESS TEST  normal   CATARACT EXTRACTION, BILATERAL  07/2014, 08/2014   COLONOSCOPY  3/08, 07/2013   no repeat rec due to age per Dr. 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

## 2023-12-13 NOTE — Progress Notes (Signed)
 Patient says that his right knee has not really gotten any better from the gel injections. He says that it is giving him the same pain. He does have some pain in the left knee as well, but it is not nearly as bothersome as the right. He is going to [place]  at the end of the month for a wedding where he will be doing a lot of walking.

## 2023-12-14 ENCOUNTER — Encounter: Payer: Self-pay | Admitting: Family Medicine

## 2023-12-14 ENCOUNTER — Ambulatory Visit (INDEPENDENT_AMBULATORY_CARE_PROVIDER_SITE_OTHER): Payer: Medicare Other | Admitting: Family Medicine

## 2023-12-14 VITALS — BP 128/52 | HR 88 | Ht 68.0 in | Wt 185.0 lb

## 2023-12-14 DIAGNOSIS — E038 Other specified hypothyroidism: Secondary | ICD-10-CM | POA: Insufficient documentation

## 2023-12-14 DIAGNOSIS — G5139 Clonic hemifacial spasm, unspecified: Secondary | ICD-10-CM

## 2023-12-14 DIAGNOSIS — Z0000 Encounter for general adult medical examination without abnormal findings: Secondary | ICD-10-CM | POA: Diagnosis not present

## 2023-12-14 DIAGNOSIS — I10 Essential (primary) hypertension: Secondary | ICD-10-CM

## 2023-12-14 DIAGNOSIS — Z5181 Encounter for therapeutic drug level monitoring: Secondary | ICD-10-CM | POA: Diagnosis not present

## 2023-12-14 DIAGNOSIS — E118 Type 2 diabetes mellitus with unspecified complications: Secondary | ICD-10-CM

## 2023-12-14 DIAGNOSIS — J449 Chronic obstructive pulmonary disease, unspecified: Secondary | ICD-10-CM

## 2023-12-14 DIAGNOSIS — E7800 Pure hypercholesterolemia, unspecified: Secondary | ICD-10-CM

## 2023-12-14 DIAGNOSIS — N183 Chronic kidney disease, stage 3 unspecified: Secondary | ICD-10-CM | POA: Diagnosis not present

## 2023-12-14 DIAGNOSIS — E1169 Type 2 diabetes mellitus with other specified complication: Secondary | ICD-10-CM | POA: Diagnosis not present

## 2023-12-14 DIAGNOSIS — N529 Male erectile dysfunction, unspecified: Secondary | ICD-10-CM

## 2023-12-14 DIAGNOSIS — I779 Disorder of arteries and arterioles, unspecified: Secondary | ICD-10-CM

## 2023-12-14 DIAGNOSIS — D649 Anemia, unspecified: Secondary | ICD-10-CM | POA: Diagnosis not present

## 2023-12-14 DIAGNOSIS — E785 Hyperlipidemia, unspecified: Secondary | ICD-10-CM

## 2023-12-14 DIAGNOSIS — E1159 Type 2 diabetes mellitus with other circulatory complications: Secondary | ICD-10-CM

## 2023-12-14 DIAGNOSIS — E1140 Type 2 diabetes mellitus with diabetic neuropathy, unspecified: Secondary | ICD-10-CM

## 2023-12-14 DIAGNOSIS — J309 Allergic rhinitis, unspecified: Secondary | ICD-10-CM

## 2023-12-14 DIAGNOSIS — I152 Hypertension secondary to endocrine disorders: Secondary | ICD-10-CM

## 2023-12-14 LAB — POCT GLYCOSYLATED HEMOGLOBIN (HGB A1C): Hemoglobin A1C: 6.3 % — AB (ref 4.0–5.6)

## 2023-12-14 MED ORDER — ANORO ELLIPTA 62.5-25 MCG/ACT IN AEPB: 1.0000 | INHALATION_SPRAY | Freq: Every day | RESPIRATORY_TRACT | 11 refills | Status: AC

## 2023-12-14 NOTE — Assessment & Plan Note (Signed)
 To restart daily Anoro. He couldn't state whether his increased trouble with activity is related to discontinuing this inhaler or not. Encouraged more regular activity, discussed ones which are not weightbearing, but aerobic

## 2023-12-14 NOTE — Assessment & Plan Note (Signed)
 Due for recheck. Some increased fatigue, only with exertion, and feeling a little cold

## 2023-12-14 NOTE — Assessment & Plan Note (Signed)
 Discussed cialis  vs sildenafil . He prefers to stick with Viagra , can use full tablet if needed

## 2023-12-14 NOTE — Assessment & Plan Note (Signed)
 Doing well with Xeomin  injection for R hemifacial spasm as needed.

## 2023-12-14 NOTE — Assessment & Plan Note (Signed)
 BP is good in office today, higher reportedly at home.  Reviewed low sodium diet, and encouraged to cut back on processed meats, salted pretzels.

## 2023-12-14 NOTE — Assessment & Plan Note (Signed)
 Mild, stable. Cont aspirin and statin

## 2023-12-14 NOTE — Assessment & Plan Note (Signed)
 Continue Flonase , and ensure taking zyrtec daily. Can use mucinex prn

## 2023-12-14 NOTE — Assessment & Plan Note (Signed)
 Due for recheck. Continue to avoid NSAIDs

## 2023-12-14 NOTE — Assessment & Plan Note (Signed)
 Reviewed low cholesterol diet, encouraged him to limit red meats, and especially processed meats. Due for recheck today. Continue statin

## 2023-12-14 NOTE — Assessment & Plan Note (Signed)
 Improved with regular use of gabapentin  30 mg and Qutenza  patch. Plans to complete series of 4 treatments.

## 2023-12-14 NOTE — Assessment & Plan Note (Signed)
 Diet controlled.  A1c improved from last year. Discussed alternatives to help with cough at night, rather than candy. Past due for eye exam, reminded to schedule

## 2023-12-15 ENCOUNTER — Encounter: Payer: Self-pay | Admitting: Family Medicine

## 2023-12-15 LAB — CBC WITH DIFFERENTIAL/PLATELET
Basophils Absolute: 0 10*3/uL (ref 0.0–0.2)
Basos: 0 %
EOS (ABSOLUTE): 0 10*3/uL (ref 0.0–0.4)
Eos: 0 %
Hematocrit: 35.4 % — ABNORMAL LOW (ref 37.5–51.0)
Hemoglobin: 11.7 g/dL — ABNORMAL LOW (ref 13.0–17.7)
Immature Grans (Abs): 0.1 10*3/uL (ref 0.0–0.1)
Immature Granulocytes: 1 %
Lymphocytes Absolute: 0.5 10*3/uL — ABNORMAL LOW (ref 0.7–3.1)
Lymphs: 4 %
MCH: 32.5 pg (ref 26.6–33.0)
MCHC: 33.1 g/dL (ref 31.5–35.7)
MCV: 98 fL — ABNORMAL HIGH (ref 79–97)
Monocytes Absolute: 1.1 10*3/uL — ABNORMAL HIGH (ref 0.1–0.9)
Monocytes: 10 %
Neutrophils Absolute: 9.4 10*3/uL — ABNORMAL HIGH (ref 1.4–7.0)
Neutrophils: 85 %
Platelets: 278 10*3/uL (ref 150–450)
RBC: 3.6 x10E6/uL — ABNORMAL LOW (ref 4.14–5.80)
RDW: 11.8 % (ref 11.6–15.4)
WBC: 11.1 10*3/uL — ABNORMAL HIGH (ref 3.4–10.8)

## 2023-12-15 LAB — CMP14+EGFR
ALT: 14 IU/L (ref 0–44)
AST: 17 IU/L (ref 0–40)
Albumin: 4.5 g/dL (ref 3.6–4.6)
Alkaline Phosphatase: 78 IU/L (ref 44–121)
BUN/Creatinine Ratio: 30 — ABNORMAL HIGH (ref 10–24)
BUN: 37 mg/dL — ABNORMAL HIGH (ref 10–36)
Bilirubin Total: 0.5 mg/dL (ref 0.0–1.2)
CO2: 20 mmol/L (ref 20–29)
Calcium: 9.6 mg/dL (ref 8.6–10.2)
Chloride: 104 mmol/L (ref 96–106)
Creatinine, Ser: 1.25 mg/dL (ref 0.76–1.27)
Globulin, Total: 2.3 g/dL (ref 1.5–4.5)
Glucose: 182 mg/dL — ABNORMAL HIGH (ref 70–99)
Potassium: 4.7 mmol/L (ref 3.5–5.2)
Sodium: 139 mmol/L (ref 134–144)
Total Protein: 6.8 g/dL (ref 6.0–8.5)
eGFR: 54 mL/min/{1.73_m2} — ABNORMAL LOW (ref >59)

## 2023-12-15 LAB — MICROALBUMIN / CREATININE URINE RATIO
Creatinine, Urine: 102 mg/dL
Microalb/Creat Ratio: 22 mg/g{creat} (ref 0–29)
Microalbumin, Urine: 22.5 ug/mL

## 2023-12-15 LAB — LIPID PANEL
Chol/HDL Ratio: 2.4 ratio (ref 0.0–5.0)
Cholesterol, Total: 151 mg/dL (ref 100–199)
HDL: 64 mg/dL (ref >39)
LDL Chol Calc (NIH): 74 mg/dL (ref 0–99)
Triglycerides: 64 mg/dL (ref 0–149)
VLDL Cholesterol Cal: 13 mg/dL (ref 5–40)

## 2023-12-15 LAB — TSH: TSH: 2.65 u[IU]/mL (ref 0.450–4.500)

## 2023-12-15 LAB — T4, FREE: Free T4: 0.82 ng/dL (ref 0.82–1.77)

## 2023-12-20 ENCOUNTER — Ambulatory Visit: Payer: Self-pay | Admitting: Family Medicine

## 2023-12-20 LAB — IRON AND TIBC
Iron Saturation: 33 % (ref 15–55)
Iron: 94 ug/dL (ref 38–169)
Total Iron Binding Capacity: 281 ug/dL (ref 250–450)
UIBC: 187 ug/dL (ref 111–343)

## 2023-12-20 LAB — SPECIMEN STATUS REPORT

## 2023-12-20 LAB — B12 AND FOLATE PANEL
Folate: 8.1 ng/mL (ref >3.0)
Vitamin B-12: 504 pg/mL (ref 232–1245)

## 2023-12-20 LAB — FERRITIN: Ferritin: 109 ng/mL (ref 30–400)

## 2023-12-21 ENCOUNTER — Other Ambulatory Visit: Payer: Self-pay | Admitting: *Deleted

## 2023-12-21 DIAGNOSIS — D649 Anemia, unspecified: Secondary | ICD-10-CM

## 2024-01-10 ENCOUNTER — Other Ambulatory Visit (HOSPITAL_BASED_OUTPATIENT_CLINIC_OR_DEPARTMENT_OTHER): Payer: Self-pay | Admitting: Cardiovascular Disease

## 2024-01-10 DIAGNOSIS — I10 Essential (primary) hypertension: Secondary | ICD-10-CM

## 2024-01-12 ENCOUNTER — Other Ambulatory Visit: Payer: Self-pay | Admitting: Sports Medicine

## 2024-01-12 DIAGNOSIS — M25519 Pain in unspecified shoulder: Secondary | ICD-10-CM | POA: Diagnosis not present

## 2024-01-23 ENCOUNTER — Other Ambulatory Visit: Payer: Self-pay

## 2024-01-25 ENCOUNTER — Other Ambulatory Visit: Payer: Self-pay

## 2024-01-25 NOTE — Progress Notes (Signed)
 Specialty Pharmacy Refill Coordination Note  Gene Kakos is a 89 y.o. male contacted today regarding refills of specialty medication(s) Capsaicin -Cleansing Gel (Qutenza  (4 Patch))   Patient requested Courier to Provider Office   Delivery date: 01/30/24   Verified address: [place]- [address]   Medication will be filled on 01/26/24 or 01/27/24.

## 2024-01-26 ENCOUNTER — Other Ambulatory Visit (HOSPITAL_COMMUNITY): Payer: Self-pay

## 2024-01-26 ENCOUNTER — Other Ambulatory Visit: Payer: Self-pay

## 2024-01-26 DIAGNOSIS — M25519 Pain in unspecified shoulder: Secondary | ICD-10-CM | POA: Diagnosis not present

## 2024-02-01 ENCOUNTER — Encounter: Payer: Self-pay | Admitting: Sports Medicine

## 2024-02-01 ENCOUNTER — Ambulatory Visit: Admitting: Sports Medicine

## 2024-02-01 DIAGNOSIS — E1142 Type 2 diabetes mellitus with diabetic polyneuropathy: Secondary | ICD-10-CM | POA: Diagnosis not present

## 2024-02-01 DIAGNOSIS — G5793 Unspecified mononeuropathy of bilateral lower limbs: Secondary | ICD-10-CM

## 2024-02-01 NOTE — Progress Notes (Signed)
 Peter [name] - 89 y.o. male MRN [id number]  Date of birth: 06-25-1930  Office Visit Note: Visit Date: 02/01/2024 PCP: Randol Dawes, MD Referred by: Randol Dawes, MD  Subjective: Chief Complaint  Patient presents with   Qutenza    HPI: Peter [name] is a pleasant 89 y.o. male who presents today for bilateral lower extremity diabetic neuropathy.  We have performed multiple Qutenza  applications in the past, with each subsequent treatment he receives further relief from his lower extremity neuropathy.  He still is managed on gabapentin  300 mg nightly as well and does wear supportive insoles for his feet.  He is agreeable to proceeding with Qutenza  application today.   He is not taking any medication for his diabetes, he is currently diet controlled.  Lab Results  Component Value Date   HGBA1C 6.3 (A) 12/14/2023   Pertinent ROS were reviewed with the patient and found to be negative unless otherwise specified above in HPI.   Assessment & Plan: Visit Diagnoses:  1. Neuropathy involving both lower extremities   2. Type 2 diabetes mellitus with diabetic polyneuropathy, without long-term current use of insulin (HCC)    Plan: Impression is chronic diabetic lower extremity neuropathy which has responded well to previous Qutenza  application as well as managed on gabapentin  300 mg nightly.  He has received cumulative benefit with each Qutenza  application. We discussed that this is a capsaicin  patch, stronger than capsaicin  cream. Discussed that it is currently approved for diabetic peripheral neuropathy and post-herpetic neuralgia, but that it has also shown benefit in treating other forms of neuropathy. Discussed that the patch would be placed in office and benefits usually last 3 months. Discussed that unintended exposure to capsaicin  can cause severe irritation of eyes, mucous membranes, respiratory tract, and skin, but that Qutenza  is a local treatment and does not have the systemic  side effects of other nerve medications. Discussed that there may be pain, itching, erythema, and decreased sensory function associated with the application of Qutenza . Side effects usually subside within 1 week. A cold pack of analgesic medications can help with these side effects. Blood pressure can also be increased due to pain associated with administration of the patch.  He is agreeable to following up at every 3 month intervals, he will continue in his shoe inserts as well for support. Ok to continue Gabapentin  300mg  at bedtime as needed.  Follow-up: Return in about 3 months (around 05/04/2024) for For Qutenza  b/l feet (will need to order).   Meds & Orders: No orders of the defined types were placed in this encounter.  No orders of the defined types were placed in this encounter.    Procedures: - 1 patch of Qutenza  was applied to the area of foot pain, which included the dorsal and plantar aspect of each foot for a total of 4 Qutenza  patches used to treat the distal peripheral nerve(s) of each foot with chemical denervation using Capsaicin  8% (Qutenza ). Blood pressure was monitored every 15 minutes without abnormality. The patient tolerated the procedure well. Post-procedure instructions were given. Qutenza  representative was present during visit to answer questions.       Clinical History: No specialty comments available.  He reports that he quit smoking about 49 years ago. His smoking use included cigarettes. He has never used smokeless tobacco.  Recent Labs    12/14/23 0857  HGBA1C 6.3*    Objective:    Physical Exam  Gen: Well-appearing, in no acute distress; non-toxic CV: Well-perfused. Warm.  Resp: Breathing unlabored on room air; no wheezing. Psych: Fluid speech in conversation; appropriate affect; normal thought process  Ortho Exam - Bilateral feet: Mild to moderate pes planus noted.  There is hammertoe deformity of toes 2-3 bilaterally with a right overlapping great toe.  CR < 2 secs, intact sensation to light touch.  Imaging: No results found.  Past Medical/Family/Surgical/Social History: Medications & Allergies reviewed per EMR, new medications updated. Patient Active Problem List   Diagnosis Date Noted   Subclinical hypothyroidism 12/14/2023   Neuropathy due to type 2 diabetes mellitus (HCC) 12/14/2023   Chronic obstructive pulmonary disease (HCC) 12/14/2023   Stage 3 chronic kidney disease (HCC) 04/16/2022   Fatigue 02/22/2022   Bilateral carotid artery disease (HCC) 02/22/2022   Acute pain of right foot 09/17/2021   Traumatic partial tear of right biceps tendon 06/30/2021   Rupture of pectoralis major muscle 06/30/2021   Greater trochanteric pain syndrome 01/24/2018   Hemiparesis affecting right side as late effect of cerebrovascular accident (HCC) 06/15/2017   Mixed hyperlipidemia 03/30/2017   Rotator cuff tear arthropathy of right shoulder 04/14/2016   Hemifacial spasm 02/12/2016   Abnormality of gait 02/12/2016   Increased prostate specific antigen (PSA) velocity 01/07/2016   Type 2 diabetes mellitus without complication (HCC) 12/18/2014   Full thickness rotator cuff tear, Left Shoulder 12/03/2014   Injury of left shoulder 08/22/2014   Facial nerve spasm 08/13/2014   Restlessness and agitation 08/13/2014   Allergic rhinitis 11/22/2012   Chest tightness 11/07/2012   Controlled type 2 diabetes mellitus with complication, without long-term current use of insulin (HCC) 01/21/2011   Pure hypercholesterolemia 01/21/2011   Essential hypertension 01/21/2011   Erectile dysfunction 01/21/2011   Past Medical History:  Diagnosis Date   Adenomatous colon polyp 07/2013   Asthma h/o as a child-s/p immunotherapy   Diabetes mellitus diet controlled   Facial nerve spasm 08/13/2014   Fatigue 02/22/2022   FHx: colon cancer    Hiatal hernia    Hypercholesteremia    Hypertension age 67   Internal hemorrhoids 07/2013   noted on colonoscopy    Restlessness and agitation 08/13/2014   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 09/18/2019   Dr.Whitfield   Family History  Problem Relation Age of Onset   Alcohol abuse Father    Cancer Sister        stomach/?pancreatic   Cancer Sister        lung and brain   Hypertension Sister    Colon cancer Brother 78   Cancer Brother        colon cancer in mid 44's   COPD Brother    Hypertension Brother    Diabetes Maternal Grandmother    Arthritis Daughter        psoriatic   Arthritis Daughter        psoriatic   Hyperlipidemia Son    Hyperlipidemia Son    Cancer Grandson 43       kidney   Kidney cancer Grandson 43   Stroke Neg Hx    Heart disease Neg Hx    Past Surgical History:  Procedure Laterality Date   APPENDECTOMY     BLEPHAROPLASTY Bilateral 12/13/2016   CARDIOVASCULAR STRESS TEST  normal   CATARACT EXTRACTION, BILATERAL  07/2014, 08/2014   COLONOSCOPY  3/08, 07/2013   no repeat rec due to age per Dr. 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)  Tobacco Use   Smoking status: Former    Current packs/day: 0.00    Types: Cigarettes    Quit date: 08/09/1974    Years since quitting: 49.5   Smokeless tobacco: Never  Vaping Use   Vaping status: Never Used  Substance and Sexual Activity   Alcohol use: Yes    Alcohol/week: 8.0 standard drinks of alcohol    Types: 8 Cans of beer per week    Comment: 8 beers/week or less   Drug use: No   Sexual activity: Yes    Partners: Female   I spent 38 minutes in the care of the patient today including face-to-face time, preparation to see the patient, as well as discussion on risk/benefits/indication for Qutenza , time spent for application, pre and post care instructions, reevaluation during active treatment, discussion on recent research and application for his diabetic neuropathy and alternative treatment options for the above diagnoses.    Lonell Sprang, DO Primary Care Sports Medicine Physician  [facility]   This note was dictated using Dragon naturally speaking software and may contain errors in syntax, spelling, or content which have not been identified prior to signing this note.

## 2024-02-02 DIAGNOSIS — M25519 Pain in unspecified shoulder: Secondary | ICD-10-CM | POA: Diagnosis not present

## 2024-02-08 ENCOUNTER — Ambulatory Visit: Admitting: Neurology

## 2024-02-08 ENCOUNTER — Other Ambulatory Visit (HOSPITAL_BASED_OUTPATIENT_CLINIC_OR_DEPARTMENT_OTHER): Payer: Self-pay | Admitting: Cardiovascular Disease

## 2024-02-08 DIAGNOSIS — I10 Essential (primary) hypertension: Secondary | ICD-10-CM

## 2024-02-15 ENCOUNTER — Other Ambulatory Visit: Payer: Self-pay

## 2024-02-16 DIAGNOSIS — M25519 Pain in unspecified shoulder: Secondary | ICD-10-CM | POA: Diagnosis not present

## 2024-02-22 ENCOUNTER — Ambulatory Visit: Admitting: Sports Medicine

## 2024-02-22 ENCOUNTER — Encounter: Payer: Self-pay | Admitting: Sports Medicine

## 2024-02-22 DIAGNOSIS — G8929 Other chronic pain: Secondary | ICD-10-CM | POA: Diagnosis not present

## 2024-02-22 DIAGNOSIS — M170 Bilateral primary osteoarthritis of knee: Secondary | ICD-10-CM | POA: Diagnosis not present

## 2024-02-22 DIAGNOSIS — M25561 Pain in right knee: Secondary | ICD-10-CM

## 2024-02-22 MED ORDER — KETOROLAC TROMETHAMINE 30 MG/ML IJ SOLN
60.0000 mg | INTRAMUSCULAR | Status: AC | PRN
  Administered 2024-02-22: 60 mg via INTRA_ARTICULAR

## 2024-02-22 NOTE — Progress Notes (Signed)
 Patient says that his left knee is still doing well and does not bother him at all anymore. He had both knees injected on 12/13/2023. He says that the right knee did okay for a couple of months, but 2-3 weeks ago his pain returned and has been constant. He says that the gel injections that he had for the right knee in the past did not give him any relief. He has been active and on his feet more lately. He also mentions that occasionally, he will feel pain radiate up the leg and to the hip, but that pain is not constant the way that the knee is. He points over the lateral hip when describing his pain.

## 2024-02-22 NOTE — Progress Notes (Signed)
 Peter [name] - 89 y.o. male MRN [id number]  Date of birth: 1929/10/24  Office Visit Note: Visit Date: 02/22/2024 PCP: Peter Dawes, MD Referred by: Peter Dawes, MD  Subjective: Chief Complaint  Patient presents with   Right Knee - Pain   Left Knee - Pain   HPI: Peter [name] is a pleasant 89 y.o. male who presents today for acute on chronic R > L knee pain.  Peter [name] has had an exacerbation of his left knee pain over the past 2-3 weeks.  We did perform bilateral corticosteroid injections into the knees back on 12/13/2023 which gave him good relief until here recently.  No specific injury.  In the past, we did do gel injections but he has not noticed much improvement from this unfortunately.  He is using topical Voltaren gel with some relief.  The pain will bother him with walking and does radiate up into the hip at times.  Pertinent ROS were reviewed with the patient and found to be negative unless otherwise specified above in HPI.   Assessment & Plan: Visit Diagnoses:  1. Bilateral primary osteoarthritis of knee   2. Chronic pain of right knee    Plan: Impression is acute exacerbation of chronic right knee pain with known bilateral osteoarthritis.  He is not quite at the 71-month mark from previous corticosteroid injection.  Through shared decision making, we did proceed with intra-articular Toradol  injection into the right knee for analgesic control, patient tolerated well.  For short-term relief, we did perform intramuscular injection into the right buttock, see note below.  He may use ice/heat as well as Tylenol and/or Voltaren gel over the knee for the next few days for pain control.  I would like to see him back for his routine follow-up for both knees in about 3 weeks.  He will call or return sooner if any issues arise.  Follow-up: Return in about 3 weeks (around 03/14/2024) for bilateral knee (30-min for poss inj)  .   Meds & Orders: No orders of the defined types were placed  in this encounter.   Orders Placed This Encounter  Procedures   Large Joint Inj     Procedures: Large Joint Inj: R knee on 02/22/2024 9:02 AM Indications: pain Details: 22 G 1.5 in needle, anterolateral approach Medications: 60 mg ketorolac  30 MG/ML Outcome: tolerated well, no immediate complications  Knee Injection, Right: After discussion on risks/benefits/indications, informed verbal consent was obtained and a timeout was performed, patient was seated on exam table. The patient's knee was prepped with Betadine and alcohol swab and utilizing anterolateral approach, the patient's knee was injected intraarticularly with 2 mL's of Toradol  (Ketorolac  30mg /mL). Patient tolerated the procedure well without immediate complications.  Procedure, treatment alternatives, risks and benefits explained, specific risks discussed. Consent was given by the patient. Patient was prepped and draped in the usual sterile fashion.          IM Methylprednisolone  1cc (40mg /mL) was administered into the right buttock today after informed consent.  Clinical History: No specialty comments available.  He reports that he quit smoking about 49 years ago. His smoking use included cigarettes. He has never used smokeless tobacco.  Recent Labs    12/14/23 0857  HGBA1C 6.3*    Objective:    Physical Exam  Gen: Well-appearing, in no acute distress; non-toxic CV: Well-perfused. Warm.  Resp: Breathing unlabored on room air; no wheezing. Psych: Fluid speech in conversation; appropriate affect; normal thought process  Ortho Exam -  There is a trace to small effusion on the right knee compared to no effusion on the left knee.  Range of motion 0-1 120/125 degrees with pain at end range of motion.  Mild TTP both over the medial and lateral joint line.  Imaging: No results found.  Past Medical/Family/Surgical/Social History: Medications & Allergies reviewed per EMR, new medications updated. Patient Active  Problem List   Diagnosis Date Noted   Subclinical hypothyroidism 12/14/2023   Neuropathy due to type 2 diabetes mellitus (HCC) 12/14/2023   Chronic obstructive pulmonary disease (HCC) 12/14/2023   Stage 3 chronic kidney disease (HCC) 04/16/2022   Fatigue 02/22/2022   Bilateral carotid artery disease (HCC) 02/22/2022   Acute pain of right foot 09/17/2021   Traumatic partial tear of right biceps tendon 06/30/2021   Rupture of pectoralis major muscle 06/30/2021   Greater trochanteric pain syndrome 01/24/2018   Hemiparesis affecting right side as late effect of cerebrovascular accident (HCC) 06/15/2017   Mixed hyperlipidemia 03/30/2017   Rotator cuff tear arthropathy of right shoulder 04/14/2016   Hemifacial spasm 02/12/2016   Abnormality of gait 02/12/2016   Increased prostate specific antigen (PSA) velocity 01/07/2016   Type 2 diabetes mellitus without complication (HCC) 12/18/2014   Full thickness rotator cuff tear, Left Shoulder 12/03/2014   Injury of left shoulder 08/22/2014   Facial nerve spasm 08/13/2014   Restlessness and agitation 08/13/2014   Allergic rhinitis 11/22/2012   Chest tightness 11/07/2012   Controlled type 2 diabetes mellitus with complication, without long-term current use of insulin (HCC) 01/21/2011   Pure hypercholesterolemia 01/21/2011   Essential hypertension 01/21/2011   Erectile dysfunction 01/21/2011   Past Medical History:  Diagnosis Date   Adenomatous colon polyp 07/2013   Asthma h/o as a child-s/p immunotherapy   Diabetes mellitus diet controlled   Facial nerve spasm 08/13/2014   Fatigue 02/22/2022   FHx: colon cancer    Hiatal hernia    Hypercholesteremia    Hypertension age 52   Internal hemorrhoids 07/2013   noted on colonoscopy   Restlessness and agitation 08/13/2014   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 09/18/2019   Dr.Whitfield   Family History  Problem Relation Age of Onset   Alcohol abuse Father    Cancer  Sister        stomach/?pancreatic   Cancer Sister        lung and brain   Hypertension Sister    Colon cancer Brother 53   Cancer Brother        colon cancer in mid 18's   COPD Brother    Hypertension Brother    Diabetes Maternal Grandmother    Arthritis Daughter        psoriatic   Arthritis Daughter        psoriatic   Hyperlipidemia Son    Hyperlipidemia Son    Cancer Grandson 68       kidney   Kidney cancer Grandson 43   Stroke Neg Hx    Heart disease Neg Hx    Past Surgical History:  Procedure Laterality Date   APPENDECTOMY     BLEPHAROPLASTY Bilateral 12/13/2016   CARDIOVASCULAR STRESS TEST  normal   CATARACT EXTRACTION, BILATERAL  07/2014, 08/2014   COLONOSCOPY  3/08, 07/2013   no repeat rec due to age per Dr. 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

## 2024-02-24 ENCOUNTER — Ambulatory Visit: Payer: Self-pay | Admitting: Sports Medicine

## 2024-02-28 DIAGNOSIS — M25519 Pain in unspecified shoulder: Secondary | ICD-10-CM | POA: Diagnosis not present

## 2024-03-06 ENCOUNTER — Encounter: Payer: Self-pay | Admitting: Sports Medicine

## 2024-03-06 ENCOUNTER — Ambulatory Visit: Admitting: Sports Medicine

## 2024-03-06 DIAGNOSIS — G8929 Other chronic pain: Secondary | ICD-10-CM

## 2024-03-06 DIAGNOSIS — M25561 Pain in right knee: Secondary | ICD-10-CM

## 2024-03-06 DIAGNOSIS — M25519 Pain in unspecified shoulder: Secondary | ICD-10-CM | POA: Diagnosis not present

## 2024-03-06 DIAGNOSIS — M170 Bilateral primary osteoarthritis of knee: Secondary | ICD-10-CM | POA: Diagnosis not present

## 2024-03-06 DIAGNOSIS — M1711 Unilateral primary osteoarthritis, right knee: Secondary | ICD-10-CM | POA: Diagnosis not present

## 2024-03-06 MED ORDER — BUPIVACAINE HCL 0.25 % IJ SOLN
2.0000 mL | INTRAMUSCULAR | Status: AC | PRN
  Administered 2024-03-06: 2 mL via INTRA_ARTICULAR

## 2024-03-06 MED ORDER — METHYLPREDNISOLONE ACETATE 40 MG/ML IJ SUSP
80.0000 mg | INTRAMUSCULAR | Status: AC | PRN
  Administered 2024-03-06: 80 mg via INTRA_ARTICULAR

## 2024-03-06 MED ORDER — LIDOCAINE HCL 1 % IJ SOLN
2.0000 mL | INTRAMUSCULAR | Status: AC | PRN
  Administered 2024-03-06: 2 mL

## 2024-03-06 NOTE — Progress Notes (Signed)
 Peter [name] - 89 y.o. male MRN [id number]  Date of birth: 28-Jul-1930  Office Visit Note: Visit Date: 03/06/2024 PCP: Randol Dawes, MD Referred by: Randol Dawes, MD  Subjective: Chief Complaint  Patient presents with   Right Knee - Pain   HPI: Peter [name] is a pleasant 89 y.o. male who presents today for acute on chronic right knee pain with OA.  Peter [name] does have osteoarthritis of both knees although his right is worse than left.  3 months ago we did provide bilateral knee corticosteroid injections.  The left knee is doing excellent, the right knee still giving him some issues.  To bridge the gap in his pain earlier this month we did provide intra-articular Toradol  injections which felt great for the first 48 to 72 hours and then his pain returns.  He does use a cane to help offload the right side.  Pertinent ROS were reviewed with the patient and found to be negative unless otherwise specified above in HPI.   Assessment & Plan: Visit Diagnoses:  1. Bilateral primary osteoarthritis of knee   2. Chronic pain of right knee    Plan: Impression is acute on chronic right knee pain with advanced osteoarthritis.  We did perform corticosteroid injection for the right knee, patient tolerated well.  Advised on postinjection protocol.  May use ice/heat as well as Tylenol or Voltaren gel for the knees for pain control.  Did advise if for some reason he does not have good relief at the 2-week mark, he will let me know and we could consider MRI of the knee to rule out insufficiency fracture or other associated pathology, other than his known OA.  Otherwise we will follow-up in about 3 months as needed.  Follow-up: Return in about 3 months (around 06/06/2024), or if symptoms worsen or fail to improve.   Meds & Orders: No orders of the defined types were placed in this encounter.   Orders Placed This Encounter  Procedures   Large Joint Inj     Procedures: Large Joint Inj: R knee on  03/06/2024 2:52 PM Details: 22 G 1.5 in needle, anteromedial approach Medications: 2 mL lidocaine  1 %; 2 mL bupivacaine  0.25 %; 80 mg methylPREDNISolone  acetate 40 MG/ML Outcome: tolerated well, no immediate complications  Knee Injection, Right: After discussion on risks/benefits/indications, informed verbal consent was obtained and a timeout was performed, patient was seated on exam table. The patient's knee was prepped with Betadine and alcohol swab and utilizing anteromedial approach, the patient's knee was injected intraarticularly with 2:2:2 lidocaine  1%:bupivicaine 0.25%:depomedrol. Patient tolerated the procedure well without immediate complications.  Procedure, treatment alternatives, risks and benefits explained, specific risks discussed. Consent was given by the patient. Patient was prepped and draped in the usual sterile fashion.          Clinical History: No specialty comments available.  He reports that he quit smoking about 49 years ago. His smoking use included cigarettes. He has never used smokeless tobacco.  Recent Labs    12/14/23 0857  HGBA1C 6.3*    Objective:    Physical Exam  Gen: Well-appearing, in no acute distress; non-toxic CV: Well-perfused. Warm.  Resp: Breathing unlabored on room air; no wheezing. Psych: Fluid speech in conversation; appropriate affect; normal thought process  Ortho Exam - Right knee: There is a trace to small effusion on the right knee compared to no effusion of the left knee.  Range of motion is slightly limited from 5-120 degrees.  Imaging:  No results found.  Past Medical/Family/Surgical/Social History: Medications & Allergies reviewed per EMR, new medications updated. Patient Active Problem List   Diagnosis Date Noted   Subclinical hypothyroidism 12/14/2023   Neuropathy due to type 2 diabetes mellitus (HCC) 12/14/2023   Chronic obstructive pulmonary disease (HCC) 12/14/2023   Stage 3 chronic kidney disease (HCC) 04/16/2022    Fatigue 02/22/2022   Bilateral carotid artery disease (HCC) 02/22/2022   Acute pain of right foot 09/17/2021   Traumatic partial tear of right biceps tendon 06/30/2021   Rupture of pectoralis major muscle 06/30/2021   Greater trochanteric pain syndrome 01/24/2018   Hemiparesis affecting right side as late effect of cerebrovascular accident (HCC) 06/15/2017   Mixed hyperlipidemia 03/30/2017   Rotator cuff tear arthropathy of right shoulder 04/14/2016   Hemifacial spasm 02/12/2016   Abnormality of gait 02/12/2016   Increased prostate specific antigen (PSA) velocity 01/07/2016   Type 2 diabetes mellitus without complication (HCC) 12/18/2014   Full thickness rotator cuff tear, Left Shoulder 12/03/2014   Injury of left shoulder 08/22/2014   Facial nerve spasm 08/13/2014   Restlessness and agitation 08/13/2014   Allergic rhinitis 11/22/2012   Chest tightness 11/07/2012   Controlled type 2 diabetes mellitus with complication, without long-term current use of insulin (HCC) 01/21/2011   Pure hypercholesterolemia 01/21/2011   Essential hypertension 01/21/2011   Erectile dysfunction 01/21/2011   Past Medical History:  Diagnosis Date   Adenomatous colon polyp 07/2013   Asthma h/o as a child-s/p immunotherapy   Diabetes mellitus diet controlled   Facial nerve spasm 08/13/2014   Fatigue 02/22/2022   FHx: colon cancer    Hiatal hernia    Hypercholesteremia    Hypertension age 29   Internal hemorrhoids 07/2013   noted on colonoscopy   Restlessness and agitation 08/13/2014   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 09/18/2019   Dr.Whitfield   Family History  Problem Relation Age of Onset   Alcohol abuse Father    Cancer Sister        stomach/?pancreatic   Cancer Sister        lung and brain   Hypertension Sister    Colon cancer Brother 79   Cancer Brother        colon cancer in mid 75's   COPD Brother    Hypertension Brother    Diabetes Maternal Grandmother     Arthritis Daughter        psoriatic   Arthritis Daughter        psoriatic   Hyperlipidemia Son    Hyperlipidemia Son    Cancer Grandson 39       kidney   Kidney cancer Grandson 43   Stroke Neg Hx    Heart disease Neg Hx    Past Surgical History:  Procedure Laterality Date   APPENDECTOMY     BLEPHAROPLASTY Bilateral 12/13/2016   CARDIOVASCULAR STRESS TEST  normal   CATARACT EXTRACTION, BILATERAL  07/2014, 08/2014   COLONOSCOPY  3/08, 07/2013   no repeat rec due to age per Dr. 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

## 2024-03-06 NOTE — Progress Notes (Signed)
 Patient says that he had great relief of his right knee pain for about 2 days after his last appointment. He says that since then, his pain has gradually returned. He would like to move forward with right knee injection today. He says that his left knee is still doing well, and he would like to wait for repeat injection for the left knee until it becomes more bothersome.

## 2024-03-07 ENCOUNTER — Other Ambulatory Visit (HOSPITAL_COMMUNITY): Payer: Self-pay

## 2024-03-14 ENCOUNTER — Ambulatory Visit: Admitting: Sports Medicine

## 2024-03-15 DIAGNOSIS — M25519 Pain in unspecified shoulder: Secondary | ICD-10-CM | POA: Diagnosis not present

## 2024-03-22 ENCOUNTER — Other Ambulatory Visit

## 2024-03-22 DIAGNOSIS — M25519 Pain in unspecified shoulder: Secondary | ICD-10-CM | POA: Diagnosis not present

## 2024-04-05 DIAGNOSIS — M25519 Pain in unspecified shoulder: Secondary | ICD-10-CM | POA: Diagnosis not present

## 2024-04-12 ENCOUNTER — Other Ambulatory Visit: Payer: Self-pay | Admitting: Family

## 2024-04-12 MED ORDER — ATORVASTATIN CALCIUM 20 MG PO TABS: 20.0000 mg | ORAL_TABLET | Freq: Every day | ORAL | 0 refills | Status: AC

## 2024-04-12 MED ORDER — LOSARTAN POTASSIUM 100 MG PO TABS: 100.0000 mg | ORAL_TABLET | Freq: Every day | ORAL | 0 refills | Status: DC

## 2024-04-12 NOTE — Addendum Note (Signed)
 Addended by: DARIO IZETTA CROME on: 04/12/2024 08:29 AM   Modules accepted: Orders

## 2024-04-13 ENCOUNTER — Other Ambulatory Visit: Payer: Self-pay

## 2024-04-24 ENCOUNTER — Other Ambulatory Visit: Payer: Self-pay | Admitting: Sports Medicine

## 2024-04-24 ENCOUNTER — Other Ambulatory Visit: Payer: Self-pay

## 2024-04-24 DIAGNOSIS — G5793 Unspecified mononeuropathy of bilateral lower limbs: Secondary | ICD-10-CM

## 2024-04-24 DIAGNOSIS — E1142 Type 2 diabetes mellitus with diabetic polyneuropathy: Secondary | ICD-10-CM

## 2024-04-24 MED ORDER — QUTENZA (4 PATCH) 8 % EX KIT
4.0000 | PACK | Freq: Once | CUTANEOUS | 0 refills | Status: DC
  Filled 2024-04-25: qty 4, 1d supply, fill #0
  Filled 2024-06-05: qty 4, 28d supply, fill #0
  Filled 2024-06-08: qty 1, 28d supply, fill #0

## 2024-04-25 ENCOUNTER — Other Ambulatory Visit: Payer: Self-pay

## 2024-04-26 ENCOUNTER — Other Ambulatory Visit: Payer: Self-pay

## 2024-04-27 ENCOUNTER — Other Ambulatory Visit: Payer: Self-pay

## 2024-05-03 ENCOUNTER — Other Ambulatory Visit: Payer: Self-pay

## 2024-05-09 ENCOUNTER — Other Ambulatory Visit: Payer: Self-pay

## 2024-05-10 DIAGNOSIS — M25519 Pain in unspecified shoulder: Secondary | ICD-10-CM | POA: Diagnosis not present

## 2024-05-15 DIAGNOSIS — M25519 Pain in unspecified shoulder: Secondary | ICD-10-CM | POA: Diagnosis not present

## 2024-05-23 ENCOUNTER — Other Ambulatory Visit: Payer: Self-pay

## 2024-05-23 DIAGNOSIS — K080 Exfoliation of teeth due to systemic causes: Secondary | ICD-10-CM | POA: Diagnosis not present

## 2024-05-31 DIAGNOSIS — M25519 Pain in unspecified shoulder: Secondary | ICD-10-CM | POA: Diagnosis not present

## 2024-06-01 ENCOUNTER — Other Ambulatory Visit: Payer: Self-pay

## 2024-06-04 ENCOUNTER — Ambulatory Visit (HOSPITAL_BASED_OUTPATIENT_CLINIC_OR_DEPARTMENT_OTHER): Admitting: Cardiovascular Disease

## 2024-06-05 ENCOUNTER — Other Ambulatory Visit: Payer: Self-pay

## 2024-06-05 NOTE — Progress Notes (Signed)
 Specialty Pharmacy Refill Coordination Note  Peter [name] is a 89 y.o. male assessed today regarding refills of clinic administered specialty medication(s) Capsaicin -Cleansing Gel (Qutenza  (4 Patch))   Clinic requested Courier to Provider Office   Delivery date: 06/11/24   Verified address: [facility]- [address]   Medication will be filled on: 06/08/24    Appointment 06/14/24. $0 copay.

## 2024-06-07 ENCOUNTER — Ambulatory Visit: Admitting: Sports Medicine

## 2024-06-07 ENCOUNTER — Encounter: Payer: Self-pay | Admitting: Sports Medicine

## 2024-06-07 DIAGNOSIS — M170 Bilateral primary osteoarthritis of knee: Secondary | ICD-10-CM

## 2024-06-07 DIAGNOSIS — M1711 Unilateral primary osteoarthritis, right knee: Secondary | ICD-10-CM | POA: Diagnosis not present

## 2024-06-07 DIAGNOSIS — G8929 Other chronic pain: Secondary | ICD-10-CM | POA: Diagnosis not present

## 2024-06-07 DIAGNOSIS — G5793 Unspecified mononeuropathy of bilateral lower limbs: Secondary | ICD-10-CM

## 2024-06-07 DIAGNOSIS — M25561 Pain in right knee: Secondary | ICD-10-CM

## 2024-06-07 MED ORDER — BUPIVACAINE HCL 0.25 % IJ SOLN
2.0000 mL | INTRAMUSCULAR | Status: AC | PRN
  Administered 2024-06-07: 2 mL via INTRA_ARTICULAR

## 2024-06-07 MED ORDER — METHYLPREDNISOLONE ACETATE 40 MG/ML IJ SUSP
80.0000 mg | INTRAMUSCULAR | Status: AC | PRN
  Administered 2024-06-07: 80 mg via INTRA_ARTICULAR

## 2024-06-07 MED ORDER — LIDOCAINE HCL 1 % IJ SOLN
2.0000 mL | INTRAMUSCULAR | Status: AC | PRN
  Administered 2024-06-07: 2 mL

## 2024-06-07 NOTE — Progress Notes (Signed)
 Patient says that his right knee pain has returned in the last 3-4 weeks. He is here today for repeat injection. His left knee is still doing well.  Patient has an appointment scheduled next week, 06/14/24, for Qutenza . Kit is scheduled for delivery on 06/11/24.

## 2024-06-07 NOTE — Progress Notes (Signed)
 Peter Peter - 89 y.o. male MRN [id number]  Date of birth: 1929/11/08  Office Visit Note: Visit Date: 06/07/2024 PCP: Randol Dawes, MD Referred by: Randol Dawes, MD  Subjective: Chief Complaint  Patient presents with   Right Knee - Pain   HPI: Peter Peter is a pleasant 89 y.o. male who presents today for chronic bilateral knee osteoarthritis, right > left knee pain today. LE neuropathy.  Bilateral knees - left knee overall continues to do well.  The right knee has been bothering him the last 3 weeks or so.  He did receive a corticosteroid injection just over 3 months for the right knee which worked very well for a little over 2 months before the effects began to wane.  As reminder, we have trialed viscosupplementation injections for him in the past which did not provide any significant benefit.  LE neuropathy - overall very happy with how this has been doing.  He has been finding good benefit with Qutenza  treatments, he does have this upcoming in November.  He also continues his Gabapentin  300mg  at bedtime.  Pertinent ROS were reviewed with the patient and found to be negative unless otherwise specified above in HPI.   Assessment & Plan: Visit Diagnoses:  1. Bilateral primary osteoarthritis of knee   2. Chronic pain of right knee   3. Neuropathy involving both lower extremities    Plan: Impression is chronic bilateral knee osteoarthritis with right knee exacerbation, which does have more significant OA in the lateral > medial compartment which does give him some instability/clicking.  He has done well from previous corticosteroid injection, appreciate decision making we did repeat this for the right knee today.  Tolerated well.  Advised on ice/heat and or Tylenol for any postinjection pain.  He is bilateral has made great strides over the last year after we have done a few treatments with Qutenza , he does have this set up coming in November.  He will continue his gabapentin   300 mg nightly.  Continue with good supportive shoe wear.  We will see him back in November for Qutenza  treatment, he may call or return sooner if any issues arise.  Meds & Orders: No orders of the defined types were placed in this encounter.   Orders Placed This Encounter  Procedures   Large Joint Inj     Procedures: Large Joint Inj: R knee on 06/07/2024 8:38 AM Details: 22 G 1.5 in needle, anterolateral approach Medications: 2 mL lidocaine  1 %; 2 mL bupivacaine  0.25 %; 80 mg methylPREDNISolone  acetate 40 MG/ML Outcome: tolerated well, no immediate complications  Knee Injection, Right: After discussion on risks/benefits/indications, informed verbal consent was obtained and a timeout was performed, patient was seated on exam table. The patient's knee was prepped with Betadine and alcohol swab and utilizing anterolateral approach, the patient's knee was injected intraarticularly with 2:2:2 lidocaine  1%:bupivicaine 0.25%:depomedrol. Patient tolerated the procedure well without immediate complications.  Procedure, treatment alternatives, risks and benefits explained, specific risks discussed. Consent was given by the patient. Patient was prepped and draped in the usual sterile fashion.          Clinical History: No specialty comments available.  He reports that he quit smoking about 49 years ago. His smoking use included cigarettes. He has never used smokeless tobacco.  Recent Labs    12/14/23 0857  HGBA1C 6.3*    Objective:   Physical Exam  Gen: Well-appearing, in no acute distress; non-toxic CV: Well-perfused. Warm.  Resp: Breathing unlabored  on room air; no wheezing. Psych: Fluid speech in conversation; appropriate affect; normal thought process  Ortho Exam - Bilateral knees: Trace effusion on the right knee, negative on the left.  Right knee range of motion 0-120 degrees, left knee 0-130 degrees.  There is pseudo instability of the right knee with valgus testing without  significant giveway.  Mild patellofemoral crepitus bilaterally.  Imaging:  *10/15/22: 4 views of the right knee including AP, Rosenberg, lateral and sunrise  views were ordered and reviewed by myself.  X-rays demonstrate mild to  moderate tricompartmental osteoarthritic change.  Cecillia view does show  collapse of the lateral joint line with near bone-on-bone arthritic change  of the lateral side.  There is some tibial spurring off the lateral  aspect.  No acute bony fracture noted.   Past Medical/Family/Surgical/Social History: Medications & Allergies reviewed per EMR, new medications updated. Patient Active Problem List   Diagnosis Date Noted   Subclinical hypothyroidism 12/14/2023   Neuropathy due to type 2 diabetes mellitus (HCC) 12/14/2023   Chronic obstructive pulmonary disease (HCC) 12/14/2023   Stage 3 chronic kidney disease (HCC) 04/16/2022   Fatigue 02/22/2022   Bilateral carotid artery disease 02/22/2022   Acute pain of right foot 09/17/2021   Traumatic partial tear of right biceps tendon 06/30/2021   Rupture of pectoralis major muscle 06/30/2021   Greater trochanteric pain syndrome 01/24/2018   Hemiparesis affecting right side as late effect of cerebrovascular accident (HCC) 06/15/2017   Mixed hyperlipidemia 03/30/2017   Rotator cuff tear arthropathy of right shoulder 04/14/2016   Hemifacial spasm 02/12/2016   Abnormality of gait 02/12/2016   Increased prostate specific antigen (PSA) velocity 01/07/2016   Type 2 diabetes mellitus without complication (HCC) 12/18/2014   Full thickness rotator cuff tear, Left Shoulder 12/03/2014   Injury of left shoulder 08/22/2014   Facial nerve spasm 08/13/2014   Restlessness and agitation 08/13/2014   Allergic rhinitis 11/22/2012   Chest tightness 11/07/2012   Controlled type 2 diabetes mellitus with complication, without long-term current use of insulin (HCC) 01/21/2011   Pure hypercholesterolemia 01/21/2011   Essential  hypertension 01/21/2011   Erectile dysfunction 01/21/2011   Past Medical History:  Diagnosis Date   Adenomatous colon polyp 07/2013   Asthma h/o as a child-s/p immunotherapy   Diabetes mellitus diet controlled   Facial nerve spasm 08/13/2014   Fatigue 02/22/2022   FHx: colon cancer    Hiatal hernia    Hypercholesteremia    Hypertension age 78   Internal hemorrhoids 07/2013   noted on colonoscopy   Restlessness and agitation 08/13/2014   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 09/18/2019   Dr.Whitfield   Family History  Problem Relation Age of Onset   Alcohol abuse Father    Cancer Sister        stomach/?pancreatic   Cancer Sister        lung and brain   Hypertension Sister    Colon cancer Brother 76   Cancer Brother        colon cancer in mid 78's   COPD Brother    Hypertension Brother    Diabetes Maternal Grandmother    Arthritis Daughter        psoriatic   Arthritis Daughter        psoriatic   Hyperlipidemia Son    Hyperlipidemia Son    Cancer Grandson 46       kidney   Kidney cancer Grandson 43   Stroke Neg  Hx    Heart disease Neg Hx    Past Surgical History:  Procedure Laterality Date   APPENDECTOMY     BLEPHAROPLASTY Bilateral 12/13/2016   CARDIOVASCULAR STRESS TEST  normal   CATARACT EXTRACTION, BILATERAL  07/2014, 08/2014   COLONOSCOPY  3/08, 07/2013   no repeat rec due to age per Dr. 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

## 2024-06-08 ENCOUNTER — Other Ambulatory Visit: Payer: Self-pay

## 2024-06-11 ENCOUNTER — Encounter (HOSPITAL_BASED_OUTPATIENT_CLINIC_OR_DEPARTMENT_OTHER): Payer: Self-pay | Admitting: Cardiovascular Disease

## 2024-06-11 ENCOUNTER — Encounter: Payer: Self-pay | Admitting: Radiology

## 2024-06-11 ENCOUNTER — Ambulatory Visit (HOSPITAL_BASED_OUTPATIENT_CLINIC_OR_DEPARTMENT_OTHER): Admitting: Cardiovascular Disease

## 2024-06-11 VITALS — BP 144/60 | HR 69 | Ht 69.0 in | Wt 184.7 lb

## 2024-06-11 DIAGNOSIS — I10 Essential (primary) hypertension: Secondary | ICD-10-CM | POA: Diagnosis not present

## 2024-06-11 DIAGNOSIS — J449 Chronic obstructive pulmonary disease, unspecified: Secondary | ICD-10-CM

## 2024-06-11 DIAGNOSIS — I69351 Hemiplegia and hemiparesis following cerebral infarction affecting right dominant side: Secondary | ICD-10-CM | POA: Diagnosis not present

## 2024-06-11 DIAGNOSIS — I6523 Occlusion and stenosis of bilateral carotid arteries: Secondary | ICD-10-CM | POA: Diagnosis not present

## 2024-06-11 DIAGNOSIS — E7800 Pure hypercholesterolemia, unspecified: Secondary | ICD-10-CM

## 2024-06-11 DIAGNOSIS — Z5181 Encounter for therapeutic drug level monitoring: Secondary | ICD-10-CM

## 2024-06-11 MED ORDER — OLMESARTAN MEDOXOMIL 40 MG PO TABS: 40.0000 mg | ORAL_TABLET | Freq: Every day | ORAL | 3 refills | Status: AC

## 2024-06-11 NOTE — Patient Instructions (Signed)
 Medication Instructions:  STOP LOSARTAN    START OLMESARTAN   *If you need a refill on your cardiac medications before your next appointment, please call your pharmacy*  Lab Work: BMET IN 1 WEEK   If you have labs (blood work) drawn today and your tests are completely normal, you will receive your results only by: MyChart Message (if you have MyChart) OR A paper copy in the mail If you have any lab test that is abnormal or we need to change your treatment, we will call you to review the results.  Testing/Procedures: Your physician has requested that you have a carotid duplex. This test is an ultrasound of the carotid arteries in your neck. It looks at blood flow through these arteries that supply the brain with blood. Allow one hour for this exam. There are no restrictions or special instructions. TO BE DONE 03/2025  Follow-Up: At [facility], you and your health needs are our priority.  As part of our continuing mission to provide you with exceptional heart care, our providers are all part of one team.  This team includes your primary Cardiologist (physician) and Advanced Practice Providers or APPs (Physician Assistants and Nurse Practitioners) who all work together to provide you with the care you need, when you need it.  Your next appointment:   AFTER CAROTID 03/2025   Provider:   Annabella Scarce, MD, ROSALINE RAMAN NP, OR CAITLIN W NP   PHARM D IN 2 TO 3 MONTHS   We recommend signing up for the patient portal called MyChart.  Sign up information is provided on this After Visit Summary.  MyChart is used to connect with patients for Virtual Visits (Telemedicine).  Patients are able to view lab/test results, encounter notes, upcoming appointments, etc.  Non-urgent messages can be sent to your provider as well.   To learn more about what you can do with MyChart, go to [email or web address].

## 2024-06-11 NOTE — Progress Notes (Signed)
 Cardiology Office Note:  .   Date:  06/11/2024  ID:  Peter [name], DOB 31-Oct-1929, MRN [id number] PCP: Randol Dawes, MD  [facility] Providers Cardiologist:  Annabella Scarce, MD    History of Present Illness: .   Peter [name] is a 89 y.o. male with a hx of hypertension, diabetes mellitus, mild carotid stenosis, hypercholesterolemia and asthma. Here for follow up.  He has a history of mild to moderate carotid stenosis.  He previously had nuclear stress test that was negative around the year 2007. He has struggled with his blood pressure and it was noted that he was taking carvedilol  only once a day.  This was to twice a day.  At the last appointment he reported feeling generally well but had some occasional tightness in his chest.  Has been occurring on and off for the preceding 30 years.  Blood pressure was uncontrolled he wanted to switch carvedilol  to long-acting.  We made that change.  He noted fatigue.  Blood counts were stable.  TSH remains slightly elevated but T3 and free T4 were normal.  ESR was normal.  He had a nuclear stress test 02/2022 that revealed LVEF 62% with no ischemia and no infarction.   At his visit 05/2022 carvedilol  was switched to amlodipine  due to fatigue.  At follow up BP remained uncontrolled so amlodipine  was increased.  He saw Dr. Alveta 12/2022 and BP was stable.    Discussed the use of AI scribe software for clinical note transcription with the patient, who gave verbal consent to proceed.  History of Present Illness Peter [name] experiences fatigue and muscular distress, particularly in his lower back and shoulders, which limits his physical activity. His physical activity is mostly limited to walking to the mailbox and occasionally taking the dog out. No breathing issues are reported.  He monitors his blood pressure regularly at home, noting it typically ranges from 124/60 to 148/65, with occasional readings as low as 123/60 in the morning and as  high as 148 in the afternoon. His blood pressure has not changed significantly over the years, despite being on medication since he was 89 years old. He previously took Coreg , which effectively lowered his diastolic pressure from the mid-90s to the low 60s, but was discontinued due to fatigue concerns. He is currently on a regimen that includes baby aspirin, atorvastatin , amlodipine , and losartan .  He experiences knee issues for which he receives cortisone injections every 90 days. He has not had an injection in one knee for three to four months.  He reports sleeping well for four to five hours initially, then waking up two to three times during the night but is able to return to sleep easily. He typically goes to bed between 10 and 11 PM and wakes up between 8 and 9 AM.  He mentions occasional leg swelling, which is alleviated by using compression boots that provide a massaging action.   ROS:  As per HPI  Studies Reviewed: SABRA   EKG Interpretation Date/Time:  Monday June 11 2024 11:59:37 EST Ventricular Rate:  69 PR Interval:  178 QRS Duration:  96 QT Interval:  392 QTC Calculation: 420 R Axis:   34  Text Interpretation: Normal sinus rhythm Low voltage QRS No previous ECGs available Confirmed by Scarce Annabella ([id number]) on 06/11/2024 12:14:56 PM   Carotid Dopplers 03/2023: Summary:  Right Carotid: Velocities in the right ICA are consistent with a 1-39%  stenosis.   Left Carotid: Velocities in the left  ICA are consistent with a 1-39%  stenosis.   Vertebrals: Bilateral vertebral arteries demonstrate antegrade flow.  Subclavians: Normal flow hemodynamics were seen in bilateral subclavian               arteries.   Risk Assessment/Calculations:     HYPERTENSION CONTROL Vitals:   06/11/24 1157 06/11/24 1734  BP: (!) 170/62 (!) 144/60    The patient's blood pressure is elevated above target today.  In order to address the patient's elevated BP:           Physical Exam:    VS:  BP (!) 144/60   Pulse 69   Ht 5' 9 (1.753 m)   Wt 184 lb 11.2 oz (83.8 kg)   SpO2 96%   BMI 27.28 kg/m  , BMI Body mass index is 27.28 kg/m. GENERAL:  Well appearing HEENT: Pupils equal round and reactive, fundi not visualized, oral mucosa unremarkable NECK:  No jugular venous distention, waveform within normal limits, carotid upstroke brisk and symmetric, no bruits, no thyromegaly LUNGS:  Clear to auscultation bilaterally HEART:  RRR.  PMI not displaced or sustained,S1 and S2 within normal limits, no S3, no S4, no clicks, no rubs, no murmurs ABD:  Flat, positive bowel sounds normal in frequency in pitch, no bruits, no rebound, no guarding, no midline pulsatile mass, no hepatomegaly, no splenomegaly EXT:  2 plus pulses throughout, no edema, no cyanosis no clubbing SKIN:  No rashes no nodules NEURO:  Cranial nerves II through XII grossly intact, motor grossly intact throughout PSYCH:  Cognitively intact, oriented to person place and time   ASSESSMENT AND PLAN: .    Assessment & Plan # Essential hypertension Blood pressure ranges from 124/60 to 148/65. Diastolic pressure improved with carvedilol  but discontinued due to fatigue. Current regimen includes amlodipine  and losartan . Discussed adding carvedilol  or switching to olmesartan. He prefers minimal changes. Insurance may not cover long-acting carvedilol . - Switched losartan  to olmesartan 40mg  once daily. - Check BMP in one week. - Follow up in 2-3 months to assess blood pressure control.  # Mild bilateral carotid artery stenosis Stenosis remains mild at 1-39%. Previous ultrasound results stable. Current management with atorvastatin  and baby aspirin effective. - Continue atorvastatin  and baby aspirin. - Repeat carotid ultrasound in August 2026.  # Pure hypercholesterolemia Cholesterol levels well-controlled with atorvastatin . - Continue atorvastatin . - LDL goal <70   Dispo: f/u PharmD in 2-3 month  Signed, Annabella Scarce, MD

## 2024-06-14 ENCOUNTER — Ambulatory Visit: Admitting: Sports Medicine

## 2024-06-14 ENCOUNTER — Encounter: Payer: Self-pay | Admitting: *Deleted

## 2024-06-14 ENCOUNTER — Encounter: Payer: Self-pay | Admitting: Sports Medicine

## 2024-06-14 VITALS — BP 117/62 | HR 68

## 2024-06-14 DIAGNOSIS — E1142 Type 2 diabetes mellitus with diabetic polyneuropathy: Secondary | ICD-10-CM | POA: Diagnosis not present

## 2024-06-14 DIAGNOSIS — G5793 Unspecified mononeuropathy of bilateral lower limbs: Secondary | ICD-10-CM | POA: Diagnosis not present

## 2024-06-14 NOTE — Progress Notes (Signed)
 Peter [name] - 89 y.o. male MRN [id number]  Date of birth: 1930/07/20  Office Visit Note: Visit Date: 06/14/2024 PCP: Randol Dawes, MD Referred by: Randol Dawes, MD  Subjective: Chief Complaint  Patient presents with   Qutenza    HPI: Peter [name] is a pleasant 89 y.o. male who presents today for bilateral lower extremity diabetic neuropathy.  We have performed multiple Qutenza  applications in the past, with each subsequent treatment he receives further relief from his lower extremity neuropathy. He still is managed on gabapentin  300 mg nightly as well and does wear supportive insoles for his feet.  He is agreeable to proceeding with Qutenza  application today, as he continues to find this quite beneficial.   He is not taking any glucose-lowering medication for his diabetes, he is currently diet controlled.   Lab Results  Component Value Date   HGBA1C 6.3 (A) 12/14/2023   Pertinent ROS were reviewed with the patient and found to be negative unless otherwise specified above in HPI.   Assessment & Plan: Visit Diagnoses:  1. Neuropathy involving both lower extremities   2. Type 2 diabetes mellitus with diabetic polyneuropathy, without long-term current use of insulin (HCC)    Plan: Impression is chronic and stable diabetic lower extremity neuropathy which has responded well to previous Qutenza  application as well as managed on gabapentin  300 mg nightly.  He has received improved benefit with each Qutenza  application. We discussed that this is a capsaicin  patch, stronger than capsaicin  cream. Discussed that it is currently approved for diabetic peripheral neuropathy and post-herpetic neuralgia, but that it has also shown benefit in treating other forms of neuropathy. Discussed that the patch would be placed in office and benefits usually last 3 months. Discussed that unintended exposure to capsaicin  can cause severe irritation of eyes, mucous membranes, respiratory tract, and  skin, but that Qutenza  is a local treatment and does not have the systemic side effects of other nerve medications. Discussed that there may be pain, itching, erythema, and decreased sensory function associated with the application of Qutenza . Side effects usually subside within 1 week. A cold pack of analgesic medications can help with these side effects. Blood pressure can also be increased due to pain associated with administration of the patch. He is agreeable to following up at every 3 month intervals with repeat Qutenza  as long as he continues finding benefit. He will continue in his shoe orthotic inserts as well for support as well as continuing Gabapentin  300mg  at bedtime.  Continue routine physical activity and healthy lifestyle interventions.  Follow-up: Return in about 3 months (around 09/14/2024) for Qutenza  b/l feet (will need to order)   Meds & Orders: No orders of the defined types were placed in this encounter.  No orders of the defined types were placed in this encounter.    Procedures: - 1 patch of Qutenza  was applied to the area of foot pain, which included the dorsal and plantar aspect of each foot for a total of 4 Qutenza  patches used to treat the distal peripheral nerve(s) of each foot with chemical denervation using Capsaicin  8% (Qutenza ). Blood pressure was monitored every 15 minutes without abnormality. The patient tolerated the procedure well. Post-procedure instructions were given. Qutenza  representative was present during visit to answer questions.       Clinical History: No specialty comments available.  He reports that he quit smoking about 49 years ago. His smoking use included cigarettes. He has never been exposed to tobacco smoke. He has  never used smokeless tobacco.  Recent Labs    12/14/23 0857  HGBA1C 6.3*    Objective:    Physical Exam  Gen: Well-appearing, in no acute distress; non-toxic CV: Well-perfused. Warm.  Resp: Breathing unlabored on room air; no  wheezing. Psych: Fluid speech in conversation; appropriate affect; normal thought process  Ortho Exam - Bilateral feet: There is moderate pes planus noted.  Hammertoe deformity of toes 2-3 bilaterally with a right great toe overlapping toe.  Cap refill less than 2 seconds, sensation to light touch intact bilaterally.  Imaging: No results found.  Past Medical/Family/Surgical/Social History: Medications & Allergies reviewed per EMR, new medications updated. Patient Active Problem List   Diagnosis Date Noted   Subclinical hypothyroidism 12/14/2023   Neuropathy due to type 2 diabetes mellitus (HCC) 12/14/2023   Chronic obstructive pulmonary disease (HCC) 12/14/2023   Stage 3 chronic kidney disease (HCC) 04/16/2022   Fatigue 02/22/2022   Bilateral carotid artery disease 02/22/2022   Acute pain of right foot 09/17/2021   Traumatic partial tear of right biceps tendon 06/30/2021   Rupture of pectoralis major muscle 06/30/2021   Greater trochanteric pain syndrome 01/24/2018   Hemiparesis affecting right side as late effect of cerebrovascular accident (HCC) 06/15/2017   Mixed hyperlipidemia 03/30/2017   Rotator cuff tear arthropathy of right shoulder 04/14/2016   Hemifacial spasm 02/12/2016   Abnormality of gait 02/12/2016   Increased prostate specific antigen (PSA) velocity 01/07/2016   Type 2 diabetes mellitus without complication (HCC) 12/18/2014   Full thickness rotator cuff tear, Left Shoulder 12/03/2014   Injury of left shoulder 08/22/2014   Facial nerve spasm 08/13/2014   Restlessness and agitation 08/13/2014   Allergic rhinitis 11/22/2012   Chest tightness 11/07/2012   Controlled type 2 diabetes mellitus with complication, without long-term current use of insulin (HCC) 01/21/2011   Pure hypercholesterolemia 01/21/2011   Essential hypertension 01/21/2011   Erectile dysfunction 01/21/2011   Past Medical History:  Diagnosis Date   Adenomatous colon polyp 07/2013   Asthma h/o as  a child-s/p immunotherapy   Diabetes mellitus diet controlled   Facial nerve spasm 08/13/2014   Fatigue 02/22/2022   FHx: colon cancer    Hiatal hernia    Hypercholesteremia    Hypertension age 31   Internal hemorrhoids 07/2013   noted on colonoscopy   Restlessness and agitation 08/13/2014   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 09/18/2019   Dr.Whitfield   Family History  Problem Relation Age of Onset   Alcohol abuse Father    Cancer Sister        stomach/?pancreatic   Cancer Sister        lung and brain   Hypertension Sister    Colon cancer Brother 46   Cancer Brother        colon cancer in mid 52's   COPD Brother    Hypertension Brother    Diabetes Maternal Grandmother    Arthritis Daughter        psoriatic   Arthritis Daughter        psoriatic   Hyperlipidemia Son    Hyperlipidemia Son    Cancer Grandson 79       kidney   Kidney cancer Grandson 43   Stroke Neg Hx    Heart disease Neg Hx    Past Surgical History:  Procedure Laterality Date   APPENDECTOMY     BLEPHAROPLASTY Bilateral 12/13/2016   CARDIOVASCULAR STRESS TEST  normal   CATARACT EXTRACTION,  BILATERAL  07/2014, 08/2014   COLONOSCOPY  3/08, 07/2013   no repeat rec due to age per Dr. 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)  Tobacco Use   Smoking status: Former    Current packs/day: 0.00    Types: Cigarettes    Quit date: 08/09/1974    Years since quitting: 49.8    Passive exposure: Never   Smokeless tobacco: Never  Vaping Use   Vaping status: Never Used  Substance and Sexual Activity   Alcohol use: Yes    Alcohol/week: 8.0 standard drinks of alcohol    Types: 8 Cans of beer per week    Comment: 8 beers/week or less   Drug use: No   Sexual activity: Yes    Partners: Female   I spent 39 minutes in the care of the patient today including face-to-face time, preparation to see the patient, as well as discussion  on risk/benefits/indication for Qutenza , time spent for application, pre and post care instructions, reevaluation during active treatment, discussion on recent research and application for his diabetic neuropathy and alternative treatment options for the above diagnoses.   Lonell Sprang, DO Primary Care Sports Medicine Physician  [facility]  This note was dictated using Dragon naturally speaking software and may contain errors in syntax, spelling, or content which have not been identified prior to signing this note.

## 2024-06-18 ENCOUNTER — Encounter: Payer: Self-pay | Admitting: Family Medicine

## 2024-06-18 DIAGNOSIS — D649 Anemia, unspecified: Secondary | ICD-10-CM

## 2024-06-18 DIAGNOSIS — E118 Type 2 diabetes mellitus with unspecified complications: Secondary | ICD-10-CM

## 2024-06-18 DIAGNOSIS — I779 Disorder of arteries and arterioles, unspecified: Secondary | ICD-10-CM

## 2024-06-18 DIAGNOSIS — E1140 Type 2 diabetes mellitus with diabetic neuropathy, unspecified: Secondary | ICD-10-CM

## 2024-06-18 DIAGNOSIS — N183 Chronic kidney disease, stage 3 unspecified: Secondary | ICD-10-CM

## 2024-06-18 DIAGNOSIS — I10 Essential (primary) hypertension: Secondary | ICD-10-CM

## 2024-06-18 DIAGNOSIS — J449 Chronic obstructive pulmonary disease, unspecified: Secondary | ICD-10-CM

## 2024-06-18 DIAGNOSIS — Z23 Encounter for immunization: Secondary | ICD-10-CM

## 2024-06-18 DIAGNOSIS — E7800 Pure hypercholesterolemia, unspecified: Secondary | ICD-10-CM

## 2024-06-18 DIAGNOSIS — Z0000 Encounter for general adult medical examination without abnormal findings: Secondary | ICD-10-CM

## 2024-06-18 DIAGNOSIS — E038 Other specified hypothyroidism: Secondary | ICD-10-CM

## 2024-06-19 DIAGNOSIS — M25519 Pain in unspecified shoulder: Secondary | ICD-10-CM | POA: Diagnosis not present

## 2024-06-21 ENCOUNTER — Other Ambulatory Visit: Payer: Self-pay

## 2024-06-26 ENCOUNTER — Other Ambulatory Visit: Payer: Self-pay

## 2024-06-26 ENCOUNTER — Other Ambulatory Visit (INDEPENDENT_AMBULATORY_CARE_PROVIDER_SITE_OTHER): Payer: Self-pay

## 2024-06-26 ENCOUNTER — Ambulatory Visit: Admitting: Sports Medicine

## 2024-06-26 ENCOUNTER — Encounter: Payer: Self-pay | Admitting: Sports Medicine

## 2024-06-26 DIAGNOSIS — M25511 Pain in right shoulder: Secondary | ICD-10-CM

## 2024-06-26 DIAGNOSIS — M75121 Complete rotator cuff tear or rupture of right shoulder, not specified as traumatic: Secondary | ICD-10-CM

## 2024-06-26 DIAGNOSIS — M19011 Primary osteoarthritis, right shoulder: Secondary | ICD-10-CM

## 2024-06-26 DIAGNOSIS — G8929 Other chronic pain: Secondary | ICD-10-CM | POA: Diagnosis not present

## 2024-06-26 MED ORDER — LIDOCAINE HCL 1 % IJ SOLN
2.0000 mL | INTRAMUSCULAR | Status: AC | PRN
  Administered 2024-06-26: 2 mL

## 2024-06-26 MED ORDER — BUPIVACAINE HCL 0.25 % IJ SOLN
2.0000 mL | INTRAMUSCULAR | Status: AC | PRN
  Administered 2024-06-26: 2 mL via INTRA_ARTICULAR

## 2024-06-26 MED ORDER — METHYLPREDNISOLONE ACETATE 40 MG/ML IJ SUSP
40.0000 mg | INTRAMUSCULAR | Status: AC | PRN
  Administered 2024-06-26: 40 mg via INTRA_ARTICULAR

## 2024-06-26 NOTE — Progress Notes (Signed)
 Patient says that he is having the same type of pain that he previously had in the right shoulder. Yesterday he was eating fries, and says just the motion of moving his arm back and forth, although not lifting the shoulder much, gave him more trouble with the shoulder.

## 2024-06-26 NOTE — Progress Notes (Signed)
 Peter [name] - 89 y.o. male MRN [id number]  Date of birth: 1930-02-14  Office Visit Note: Visit Date: 06/26/2024 PCP: Randol Dawes, MD Referred by: Randol Dawes, MD  Subjective: Chief Complaint  Patient presents with   Right Shoulder - Pain   HPI: Peter [name] is a pleasant 89 y.o. male who presents today for acute on chronic right shoulder pain.  We have seen Gene for the shoulder in years past.  He did have a diagnostic ultrasound back in 2023 which showed proximal bicep tendon tear with retraction as well as full-thickness supraspinatus tear with retraction and insertional infraspinatus tendinopathy.  He recently he was doing repetitive motion with the arm back-and-forth and feel like this exacerbated his pain.  He is asking about what sort of treatments he can do for this.  He is not interested in formalized physical therapy or taking oral medication  Pertinent ROS were reviewed with the patient and found to be negative unless otherwise specified above in HPI.   Assessment & Plan: Visit Diagnoses:  1. Chronic right shoulder pain   2. Nontraumatic complete tear of right rotator cuff   3. Primary osteoarthritis, right shoulder    Plan: Impression is acute exacerbation of chronic right shoulder pain which has at least moderate osteoarthritis as well as a significantly high riding humeral head in the setting of multiple rotator cuff full-thickness tears with retraction.  He is not interested in formalized physical therapy or oral medications.  Has tried topical medicines.  In the past for the shoulder and the contralateral shoulder in years past he did respond well to corticosteroid injection.  Through shared decision making, did proceed with ultrasound-guided subacromial joint injection, patient tolerated well.  Advised on postinjection protocol.  May use ice/heat and/or Tylenol or ibuprofen in the short-term.  He will follow-up with me as needed.  Follow-up: Return if  symptoms worsen or fail to improve.   Meds & Orders: No orders of the defined types were placed in this encounter.   Orders Placed This Encounter  Procedures   Large Joint Inj   XR Shoulder Right   US  Guided Needle Placement - No Linked Charges     Procedures: Large Joint Inj: R subacromial bursa on 06/26/2024 1:48 PM Indications: pain Details: 22 G 1.5 in needle, ultrasound-guided posterior approach Medications: 2 mL lidocaine  1 %; 2 mL bupivacaine  0.25 %; 40 mg methylPREDNISolone  acetate 40 MG/ML Outcome: tolerated well, no immediate complications  US -Guided Subacromial Injection, Right Shoulder  After discussion on risks/benefits/indications, informed verbal consent was obtained. A timeout was then performed. Patient was seated on table in exam room. The patient's shoulder was prepped with betadine and alcohol swabs and utilizing lateral approach with ultrasound guidance, the patient's subacromial space was injected with 2:2:1 mixture of lidocaine :bupivicaine:depomedrol via an in-plane approach. Patient tolerated the procedure well without immediate complications.   Procedure, treatment alternatives, risks and benefits explained, specific risks discussed. Consent was given by the patient. Immediately prior to procedure a time out was called to verify the correct patient, procedure, equipment, support staff and site/side marked as required. Patient was prepped and draped in the usual sterile fashion.          Clinical History: No specialty comments available.  He reports that he quit smoking about 49 years ago. His smoking use included cigarettes. He has never been exposed to tobacco smoke. He has never used smokeless tobacco.  Recent Labs    12/14/23 0857  HGBA1C 6.3*  Objective:    Physical Exam  Gen: Well-appearing, in no acute distress; non-toxic CV: Well-perfused. Warm.  Resp: Breathing unlabored on room air; no wheezing. Psych: Fluid speech in conversation;  appropriate affect; normal thought process  Ortho Exam - Right shoulder: Inspection of the right shoulder demonstrates no effusion.  There is deltoid and scapular compensation on the right.  There is a painful arc through range of motion specifically with abduction.  Imaging:  *I did independently review and interpret the complete right shoulder ultrasound from 11/25/2021 today.  There is chronic complete tear of the proximal head of the biceps tendon with retraction down to the proximal humerus level.  There is revisualized full-thickness supraspinatus tearing with tendon retraction.  There is insertional infraspinatus tendinopathy and scar tissue near the insertion of the pectoralis major tendon anteriorly.  Narrative & Impression  [facility] Complete US  of Shoulder, right   Patient was seated on exam table and shoulder US  examination was performed using high frequency linear probe.  -Short and long axis evaluation of the biceps tendon demonstrates absence in the bicipital groove.  Long axis view does show complete tendon tear with a few cms of retraction. -Scanning distally from short axis of the bicep tendon we do identify the pectoralis major insertion which does have some hypoechoic change near the proximal insertion, appearing to be chronic tear.  There is scar tissue formation noted proximal to this. -Subscapularis identified (seen as SAJ) with significant cortical irregularity of the humeral head, there is tendinopathy changes within the subscapularis although no full-thickness tearing noted. -AC joint identified with mild arthritic change, no effusion noted. -Full-thickness supraspinatus insertional tear noted with retraction.  There is again cortical irregularity noted of the ventricular surface of the greater tubercle.  There is complete resolution of the previous seroma in this area. -Infraspinatus identified with some tendinopathic changes, although no tearing.  Teres minor intact without  irregularity. -Glenohumeral joint identified with mild arthritic change although no bursal distention.   IMPRESSION: Revisualized complete tear of short head of bicep tendon with retraction; revisualize supraspinatus full-thickness insertional tear.  Chronically healed partial pectoralis major tendon tear with scar tissue.  Otherwise tendinopathic changes of the rotator cuff.    Past Medical/Family/Surgical/Social History: Medications & Allergies reviewed per EMR, new medications updated. Patient Active Problem List   Diagnosis Date Noted   Subclinical hypothyroidism 12/14/2023   Neuropathy due to type 2 diabetes mellitus (HCC) 12/14/2023   Chronic obstructive pulmonary disease (HCC) 12/14/2023   Stage 3 chronic kidney disease (HCC) 04/16/2022   Fatigue 02/22/2022   Bilateral carotid artery disease 02/22/2022   Acute pain of right foot 09/17/2021   Traumatic partial tear of right biceps tendon 06/30/2021   Rupture of pectoralis major muscle 06/30/2021   Greater trochanteric pain syndrome 01/24/2018   Hemiparesis affecting right side as late effect of cerebrovascular accident (HCC) 06/15/2017   Mixed hyperlipidemia 03/30/2017   Rotator cuff tear arthropathy of right shoulder 04/14/2016   Hemifacial spasm 02/12/2016   Abnormality of gait 02/12/2016   Increased prostate specific antigen (PSA) velocity 01/07/2016   Type 2 diabetes mellitus without complication (HCC) 12/18/2014   Full thickness rotator cuff tear, Left Shoulder 12/03/2014   Injury of left shoulder 08/22/2014   Facial nerve spasm 08/13/2014   Restlessness and agitation 08/13/2014   Allergic rhinitis 11/22/2012   Chest tightness 11/07/2012   Controlled type 2 diabetes mellitus with complication, without long-term current use of insulin (HCC) 01/21/2011   Pure hypercholesterolemia 01/21/2011  Essential hypertension 01/21/2011   Erectile dysfunction 01/21/2011   Past Medical History:  Diagnosis Date   Adenomatous colon  polyp 07/2013   Asthma h/o as a child-s/p immunotherapy   Diabetes mellitus diet controlled   Facial nerve spasm 08/13/2014   Fatigue 02/22/2022   FHx: colon cancer    Hiatal hernia    Hypercholesteremia    Hypertension age 71   Internal hemorrhoids 07/2013   noted on colonoscopy   Restlessness and agitation 08/13/2014   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 09/18/2019   Dr.Whitfield   Family History  Problem Relation Age of Onset   Alcohol abuse Father    Cancer Sister        stomach/?pancreatic   Cancer Sister        lung and brain   Hypertension Sister    Colon cancer Brother 35   Cancer Brother        colon cancer in mid 18's   COPD Brother    Hypertension Brother    Diabetes Maternal Grandmother    Arthritis Daughter        psoriatic   Arthritis Daughter        psoriatic   Hyperlipidemia Son    Hyperlipidemia Son    Cancer Grandson 63       kidney   Kidney cancer Grandson 43   Stroke Neg Hx    Heart disease Neg Hx    Past Surgical History:  Procedure Laterality Date   APPENDECTOMY     BLEPHAROPLASTY Bilateral 12/13/2016   CARDIOVASCULAR STRESS TEST  normal   CATARACT EXTRACTION, BILATERAL  07/2014, 08/2014   COLONOSCOPY  3/08, 07/2013   no repeat rec due to age per Dr. 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

## 2024-06-28 DIAGNOSIS — M25519 Pain in unspecified shoulder: Secondary | ICD-10-CM | POA: Diagnosis not present

## 2024-07-02 ENCOUNTER — Other Ambulatory Visit: Payer: Self-pay

## 2024-07-03 ENCOUNTER — Other Ambulatory Visit: Payer: Self-pay

## 2024-07-03 ENCOUNTER — Other Ambulatory Visit (HOSPITAL_BASED_OUTPATIENT_CLINIC_OR_DEPARTMENT_OTHER): Payer: Self-pay

## 2024-07-03 MED ORDER — COMIRNATY 30 MCG/0.3ML IM SUSY
0.3000 mL | PREFILLED_SYRINGE | Freq: Once | INTRAMUSCULAR | 0 refills | Status: AC
  Filled 2024-07-03 – 2024-07-10 (×2): qty 0.3, 1d supply, fill #0

## 2024-07-10 ENCOUNTER — Other Ambulatory Visit (HOSPITAL_BASED_OUTPATIENT_CLINIC_OR_DEPARTMENT_OTHER): Payer: Self-pay

## 2024-07-10 DIAGNOSIS — I10 Essential (primary) hypertension: Secondary | ICD-10-CM | POA: Diagnosis not present

## 2024-07-10 DIAGNOSIS — Z5181 Encounter for therapeutic drug level monitoring: Secondary | ICD-10-CM | POA: Diagnosis not present

## 2024-07-10 LAB — BASIC METABOLIC PANEL WITH GFR
BUN/Creatinine Ratio: 20 (ref 10–24)
BUN: 27 mg/dL (ref 10–36)
CO2: 22 mmol/L (ref 20–29)
Calcium: 9 mg/dL (ref 8.6–10.2)
Chloride: 105 mmol/L (ref 96–106)
Creatinine, Ser: 1.38 mg/dL — ABNORMAL HIGH (ref 0.76–1.27)
Glucose: 99 mg/dL (ref 70–99)
Potassium: 5 mmol/L (ref 3.5–5.2)
Sodium: 141 mmol/L (ref 134–144)
eGFR: 48 mL/min/1.73 — ABNORMAL LOW (ref >59)

## 2024-07-23 ENCOUNTER — Ambulatory Visit: Payer: Self-pay | Admitting: Cardiovascular Disease

## 2024-07-24 ENCOUNTER — Telehealth: Payer: Self-pay | Admitting: Sports Medicine

## 2024-07-24 NOTE — Telephone Encounter (Signed)
Patient scheduled tomorrow at 11 am.

## 2024-07-24 NOTE — Telephone Encounter (Signed)
 Pt son called and ask do you think you could get his dad in sooner for a cortisone shot in the L knee. CB#[phone]

## 2024-07-25 ENCOUNTER — Encounter: Payer: Self-pay | Admitting: Sports Medicine

## 2024-07-25 ENCOUNTER — Other Ambulatory Visit (INDEPENDENT_AMBULATORY_CARE_PROVIDER_SITE_OTHER)

## 2024-07-25 ENCOUNTER — Ambulatory Visit: Admitting: Sports Medicine

## 2024-07-25 DIAGNOSIS — G8929 Other chronic pain: Secondary | ICD-10-CM

## 2024-07-25 DIAGNOSIS — M1711 Unilateral primary osteoarthritis, right knee: Secondary | ICD-10-CM

## 2024-07-25 DIAGNOSIS — M25561 Pain in right knee: Secondary | ICD-10-CM | POA: Diagnosis not present

## 2024-07-25 MED ORDER — METHYLPREDNISOLONE ACETATE 40 MG/ML IJ SUSP
60.0000 mg | INTRAMUSCULAR | Status: AC | PRN
  Administered 2024-07-25: 12:00:00 60 mg via INTRA_ARTICULAR

## 2024-07-25 MED ORDER — BUPIVACAINE HCL 0.25 % IJ SOLN
2.0000 mL | INTRAMUSCULAR | Status: AC | PRN
  Administered 2024-07-25: 12:00:00 2 mL via INTRA_ARTICULAR

## 2024-07-25 MED ORDER — LIDOCAINE HCL 1 % IJ SOLN
2.0000 mL | INTRAMUSCULAR | Status: AC | PRN
  Administered 2024-07-25: 12:00:00 2 mL

## 2024-07-25 NOTE — Progress Notes (Signed)
 Peter [name] - 89 y.o. male MRN [id number]  Date of birth: 1929/12/16  Office Visit Note: Visit Date: 07/25/2024 PCP: Randol Dawes, MD Referred by: Randol Dawes, MD  Subjective: Chief Complaint  Patient presents with   Right Knee - Pain   HPI: Peter [name] is a pleasant 89 y.o. male who presents today for acute on chronic right knee pain.  Gene has responded to injections in the past, although over the last year or so they have not been as long.  He and his son (Gene) do note that he has become slightly more unsteady on his feet, although his knee pain is in addition to this.  He has more pain now specifically with weightbearing and putting pressure over the leg, although occasionally still has a sharp stabbing sensation.  His pain is between a 5-6/10.   Pertinent ROS were reviewed with the patient and found to be negative unless otherwise specified above in HPI.   Assessment & Plan: Visit Diagnoses:  1. Unilateral primary osteoarthritis, right knee   2. Chronic pain of right knee    Plan: Impression is acute on chronic right knee pain in the setting of osteoarthritis most prominent in the lateral compartment.  He has received benefit from previous corticosteroid injections, although here his pain has worsened recently.  X-rays do not show much arthritic progression from March 2024, although he does have some sclerosis over the lateral joint line.  Given the quick turnover from benefit from previous injection, I did discuss the possibility of a subchondral insufficiency fracture given his cartilage loss.  I would like to evaluate this further with an MRI of the knee, he is claustrophobic and does prefer an open MRI.  He will send me a message where he had his previous MRI in which he did well with.  For his current relief, we did proceed with right knee corticosteroid injection, patient tolerated well.  Advised on postinjection protocol.  May use ice/heat and/or Tylenol or  topical medications.  We did discuss the role for possible genicular nerve block for possible longer-term relief given that corticosteroid injections have been waning in terms of duration of benefit.  I would like to obtain MRI first but this is something we can consider going forward.  Follow-up: Return for Will message me back regarding location of MRI.   Meds & Orders: No orders of the defined types were placed in this encounter.   Orders Placed This Encounter  Procedures   Large Joint Inj: R knee   XR Knee Complete 4 Views Right     Procedures: Large Joint Inj: R knee on 07/25/2024 11:32 AM Details: 22 G 1.5 in needle, anterolateral approach Medications: 2 mL lidocaine  1 %; 2 mL bupivacaine  0.25 %; 60 mg methylPREDNISolone  acetate 40 MG/ML Outcome: tolerated well, no immediate complications  Knee Injection, Right: After discussion on risks/benefits/indications, informed verbal consent was obtained and a timeout was performed, patient was seated on exam table. The patient's knee was prepped with Betadine and alcohol swab and utilizing anterolateral approach, the patient's knee was injected intraarticularly with 2:2:1.5 lidocaine  1%:bupivicaine 0.25%:depomedrol. Patient tolerated the procedure well without immediate complications.  Procedure, treatment alternatives, risks and benefits explained, specific risks discussed. Consent was given by the patient. Patient was prepped and draped in the usual sterile fashion.          Clinical History: No specialty comments available.  He reports that he quit smoking about 49 years ago. His smoking use  included cigarettes. He has never been exposed to tobacco smoke. He has never used smokeless tobacco.  Recent Labs    12/14/23 0857  HGBA1C 6.3*    Objective:    Physical Exam  Gen: Well-appearing, in no acute distress; non-toxic CV: Well-perfused. Warm.  Resp: Breathing unlabored on room air; no wheezing. Psych: Fluid speech in  conversation; appropriate affect; normal thought process  Ortho Exam - Right knee: No significant redness swelling or effusion.  There is positive TTP over the lateral joint line.  Mild patellofemoral crepitus.  Range of motion from 0-125 degrees.  There is pain with weightbearing.  Imaging: XR Knee Complete 4 Views Right Result Date: 07/25/2024 4 view x-ray of the right knee including AP standing, Rosenberg, lateral and sunrise view was ordered and reviewed by myself today.  X-rays redemonstrates tricompartmental moderate osteoarthritic change with near bone-on-bone change of the lateral joint line with subchondral sclerosis.  There is a small degree of spurring off the medial femoral condyle near the MCL insertion.  Joint space narrowing is relatively similar to March 2024 x-rays.   March 2024: 4 views of the right knee including AP, Rosenberg, lateral and sunrise  views were ordered and reviewed by myself.  X-rays demonstrate mild to  moderate tricompartmental osteoarthritic change.  Cecillia view does show  collapse of the lateral joint line with near bone-on-bone arthritic change  of the lateral side.  There is some tibial spurring off the lateral  aspect.  No acute bony fracture noted.   Past Medical/Family/Surgical/Social History: Medications & Allergies reviewed per EMR, new medications updated. Patient Active Problem List   Diagnosis Date Noted   Subclinical hypothyroidism 12/14/2023   Neuropathy due to type 2 diabetes mellitus (HCC) 12/14/2023   Chronic obstructive pulmonary disease (HCC) 12/14/2023   Stage 3 chronic kidney disease (HCC) 04/16/2022   Fatigue 02/22/2022   Bilateral carotid artery disease 02/22/2022   Acute pain of right foot 09/17/2021   Traumatic partial tear of right biceps tendon 06/30/2021   Rupture of pectoralis major muscle 06/30/2021   Greater trochanteric pain syndrome 01/24/2018   Hemiparesis affecting right side as late effect of cerebrovascular  accident (HCC) 06/15/2017   Mixed hyperlipidemia 03/30/2017   Rotator cuff tear arthropathy of right shoulder 04/14/2016   Hemifacial spasm 02/12/2016   Abnormality of gait 02/12/2016   Increased prostate specific antigen (PSA) velocity 01/07/2016   Type 2 diabetes mellitus without complication (HCC) 12/18/2014   Full thickness rotator cuff tear, Left Shoulder 12/03/2014   Injury of left shoulder 08/22/2014   Facial nerve spasm 08/13/2014   Restlessness and agitation 08/13/2014   Allergic rhinitis 11/22/2012   Chest tightness 11/07/2012   Controlled type 2 diabetes mellitus with complication, without long-term current use of insulin (HCC) 01/21/2011   Pure hypercholesterolemia 01/21/2011   Essential hypertension 01/21/2011   Erectile dysfunction 01/21/2011   Past Medical History:  Diagnosis Date   Adenomatous colon polyp 07/2013   Asthma h/o as a child-s/p immunotherapy   Diabetes mellitus diet controlled   Facial nerve spasm 08/13/2014   Fatigue 02/22/2022   FHx: colon cancer    Hiatal hernia    Hypercholesteremia    Hypertension age 79   Internal hemorrhoids 07/2013   noted on colonoscopy   Restlessness and agitation 08/13/2014   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 09/18/2019   Dr.Whitfield   Family History  Problem Relation Age of Onset   Alcohol abuse Father    Cancer  Sister        stomach/?pancreatic   Cancer Sister        lung and brain   Hypertension Sister    Colon cancer Brother 32   Cancer Brother        colon cancer in mid 38's   COPD Brother    Hypertension Brother    Diabetes Maternal Grandmother    Arthritis Daughter        psoriatic   Arthritis Daughter        psoriatic   Hyperlipidemia Son    Hyperlipidemia Son    Cancer Grandson 30       kidney   Kidney cancer Grandson 43   Stroke Neg Hx    Heart disease Neg Hx    Past Surgical History:  Procedure Laterality Date   APPENDECTOMY     BLEPHAROPLASTY Bilateral  12/13/2016   CARDIOVASCULAR STRESS TEST  normal   CATARACT EXTRACTION, BILATERAL  07/2014, 08/2014   COLONOSCOPY  3/08, 07/2013   no repeat rec due to age per Dr. 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

## 2024-08-10 ENCOUNTER — Ambulatory Visit: Admitting: Pharmacist

## 2024-08-16 ENCOUNTER — Other Ambulatory Visit: Payer: Self-pay | Admitting: Orthopaedic Surgery

## 2024-08-16 ENCOUNTER — Other Ambulatory Visit: Payer: Self-pay | Admitting: Sports Medicine

## 2024-08-16 ENCOUNTER — Other Ambulatory Visit (HOSPITAL_COMMUNITY): Payer: Self-pay

## 2024-08-16 ENCOUNTER — Other Ambulatory Visit: Payer: Self-pay

## 2024-08-16 DIAGNOSIS — E1142 Type 2 diabetes mellitus with diabetic polyneuropathy: Secondary | ICD-10-CM

## 2024-08-16 DIAGNOSIS — G5793 Unspecified mononeuropathy of bilateral lower limbs: Secondary | ICD-10-CM

## 2024-08-16 MED ORDER — QUTENZA (4 PATCH) 8 % EX KIT
4.0000 | PACK | Freq: Once | CUTANEOUS | 0 refills | Status: AC
  Filled 2024-08-16 – 2024-09-05 (×2): qty 4, 1d supply, fill #0
  Filled 2024-09-07: qty 4, 28d supply, fill #0

## 2024-08-20 ENCOUNTER — Other Ambulatory Visit: Payer: Self-pay

## 2024-08-30 ENCOUNTER — Other Ambulatory Visit: Payer: Self-pay

## 2024-09-02 ENCOUNTER — Encounter: Payer: Self-pay | Admitting: *Deleted

## 2024-09-03 ENCOUNTER — Encounter: Admitting: Family Medicine

## 2024-09-05 ENCOUNTER — Other Ambulatory Visit: Payer: Self-pay

## 2024-09-05 ENCOUNTER — Other Ambulatory Visit (HOSPITAL_COMMUNITY): Payer: Self-pay

## 2024-09-05 NOTE — Progress Notes (Signed)
 Benefits Investigation Started  Reason: Product Not On Formulary  Routed to: [facility]

## 2024-09-06 ENCOUNTER — Telehealth: Payer: Self-pay

## 2024-09-06 ENCOUNTER — Other Ambulatory Visit: Payer: Self-pay

## 2024-09-06 NOTE — Telephone Encounter (Signed)
 Pharmacy Patient Advocate Encounter   Received notification from Pt Calls Messages that prior authorization for Qutenza  is required/requested.   Insurance verification completed.   The patient is insured through [facility].   Per test claim: PA required; PA submitted to above mentioned insurance via Latent Key/confirmation #/EOC [facility] Status is pending

## 2024-09-06 NOTE — Telephone Encounter (Signed)
 Pharmacy Patient Advocate Encounter  Received notification from [facility] that Prior Authorization for Qutenza  has been APPROVED from 09/06/24 to 09/06/25   PA #/Case ID/Reference #: [id number]

## 2024-09-06 NOTE — Progress Notes (Signed)
 PA approved  Pharmacy Patient Advocate Encounter  Insurance verification completed.   The patient is insured through [facility]   Ran test claim for Qutenza . Currently a quantity of 4 is a 56 day supply and the co-pay is $1,624.53. Patient eligible for medicare payment plan  This test claim was processed through [facility]- copay amounts may vary at other pharmacies due to pharmacy/plan contracts, or as the patient moves through the different stages of their insurance plan.

## 2024-09-07 ENCOUNTER — Other Ambulatory Visit: Payer: Self-pay

## 2024-09-10 ENCOUNTER — Other Ambulatory Visit (HOSPITAL_COMMUNITY): Payer: Self-pay

## 2024-09-12 ENCOUNTER — Other Ambulatory Visit: Payer: Self-pay

## 2024-09-12 ENCOUNTER — Telehealth: Payer: Self-pay | Admitting: Sports Medicine

## 2024-09-12 ENCOUNTER — Other Ambulatory Visit (HOSPITAL_COMMUNITY): Payer: Self-pay

## 2024-09-12 NOTE — Telephone Encounter (Signed)
 I called [facility] regarding patient's Qutenza . She says that she spoke with him this morning. His copay is $1300, so he is looking into payment plans before confirming treatment. She says that if they hear back from him by end of day today, we would likely receive the kit by his appointment on Monday.

## 2024-09-13 NOTE — Telephone Encounter (Signed)
 I haven't done anything with Qutenza , so I would not be able to help with this one.  Maybe ask Sari Beal, she may be able to assist.

## 2024-09-14 ENCOUNTER — Other Ambulatory Visit: Payer: Self-pay

## 2024-09-14 ENCOUNTER — Encounter: Payer: Self-pay | Admitting: Sports Medicine

## 2024-09-17 ENCOUNTER — Ambulatory Visit: Admitting: Sports Medicine

## 2024-09-26 ENCOUNTER — Ambulatory Visit: Admitting: Sports Medicine

## 2024-10-04 ENCOUNTER — Encounter: Admitting: Family Medicine

## 2025-01-09 ENCOUNTER — Encounter: Payer: Self-pay | Admitting: Family Medicine

## 2025-03-11 ENCOUNTER — Encounter (HOSPITAL_BASED_OUTPATIENT_CLINIC_OR_DEPARTMENT_OTHER)
